# Patient Record
Sex: Male | Born: 1945 | Race: White | Hispanic: No | Marital: Married | Smoking: Never smoker
Health system: Southern US, Community
[De-identification: ages and names within clinical notes are randomized; demographics above are authoritative.]

## PROBLEM LIST (undated history)

## (undated) DIAGNOSIS — IMO0002 Reserved for concepts with insufficient information to code with codable children: Secondary | ICD-10-CM

## (undated) DIAGNOSIS — C439 Malignant melanoma of skin, unspecified: Secondary | ICD-10-CM

## (undated) DIAGNOSIS — Z87442 Personal history of urinary calculi: Secondary | ICD-10-CM

## (undated) DIAGNOSIS — T8859XA Other complications of anesthesia, initial encounter: Secondary | ICD-10-CM

## (undated) DIAGNOSIS — N400 Enlarged prostate without lower urinary tract symptoms: Secondary | ICD-10-CM

## (undated) DIAGNOSIS — N200 Calculus of kidney: Secondary | ICD-10-CM

## (undated) DIAGNOSIS — I2510 Atherosclerotic heart disease of native coronary artery without angina pectoris: Secondary | ICD-10-CM

## (undated) DIAGNOSIS — D126 Benign neoplasm of colon, unspecified: Secondary | ICD-10-CM

## (undated) DIAGNOSIS — C801 Malignant (primary) neoplasm, unspecified: Secondary | ICD-10-CM

## (undated) DIAGNOSIS — M1990 Unspecified osteoarthritis, unspecified site: Secondary | ICD-10-CM

## (undated) DIAGNOSIS — N529 Male erectile dysfunction, unspecified: Secondary | ICD-10-CM

## (undated) DIAGNOSIS — T4145XA Adverse effect of unspecified anesthetic, initial encounter: Secondary | ICD-10-CM

## (undated) DIAGNOSIS — R9430 Abnormal result of cardiovascular function study, unspecified: Secondary | ICD-10-CM

## (undated) DIAGNOSIS — K219 Gastro-esophageal reflux disease without esophagitis: Secondary | ICD-10-CM

## (undated) DIAGNOSIS — R011 Cardiac murmur, unspecified: Secondary | ICD-10-CM

## (undated) DIAGNOSIS — I10 Essential (primary) hypertension: Secondary | ICD-10-CM

## (undated) DIAGNOSIS — E785 Hyperlipidemia, unspecified: Secondary | ICD-10-CM

## (undated) DIAGNOSIS — T884XXA Failed or difficult intubation, initial encounter: Secondary | ICD-10-CM

## (undated) DIAGNOSIS — K649 Unspecified hemorrhoids: Secondary | ICD-10-CM

## (undated) DIAGNOSIS — G4700 Insomnia, unspecified: Secondary | ICD-10-CM

## (undated) DIAGNOSIS — R159 Full incontinence of feces: Secondary | ICD-10-CM

## (undated) HISTORY — DX: Male erectile dysfunction, unspecified: N52.9

## (undated) HISTORY — DX: Hyperlipidemia, unspecified: E78.5

## (undated) HISTORY — DX: Unspecified hemorrhoids: K64.9

## (undated) HISTORY — DX: Full incontinence of feces: R15.9

## (undated) HISTORY — DX: Abnormal result of cardiovascular function study, unspecified: R94.30

## (undated) HISTORY — PX: PROSTATE BIOPSY: SHX241

## (undated) HISTORY — DX: Benign neoplasm of colon, unspecified: D12.6

## (undated) HISTORY — DX: Cardiac murmur, unspecified: R01.1

## (undated) HISTORY — DX: Reserved for concepts with insufficient information to code with codable children: IMO0002

## (undated) HISTORY — DX: Insomnia, unspecified: G47.00

## (undated) HISTORY — PX: TONSILLECTOMY: SUR1361

## (undated) HISTORY — DX: Essential (primary) hypertension: I10

## (undated) HISTORY — DX: Benign prostatic hyperplasia without lower urinary tract symptoms: N40.0

---
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
## 2004-02-09 ENCOUNTER — Ambulatory Visit: Payer: Self-pay | Admitting: Internal Medicine

## 2004-02-21 ENCOUNTER — Ambulatory Visit: Payer: Self-pay | Admitting: Internal Medicine

## 2004-05-31 ENCOUNTER — Ambulatory Visit: Payer: Self-pay | Admitting: Internal Medicine

## 2004-08-31 ENCOUNTER — Ambulatory Visit: Payer: Self-pay | Admitting: Internal Medicine

## 2004-11-30 ENCOUNTER — Ambulatory Visit: Payer: Self-pay | Admitting: Internal Medicine

## 2005-02-09 ENCOUNTER — Ambulatory Visit: Payer: Self-pay | Admitting: Internal Medicine

## 2005-03-29 ENCOUNTER — Ambulatory Visit: Payer: Self-pay | Admitting: Internal Medicine

## 2005-05-10 ENCOUNTER — Ambulatory Visit: Payer: Self-pay | Admitting: Internal Medicine

## 2005-08-08 ENCOUNTER — Ambulatory Visit: Payer: Self-pay | Admitting: Internal Medicine

## 2005-08-10 ENCOUNTER — Ambulatory Visit: Payer: Self-pay | Admitting: Internal Medicine

## 2006-01-15 DIAGNOSIS — R011 Cardiac murmur, unspecified: Secondary | ICD-10-CM

## 2006-01-15 HISTORY — DX: Cardiac murmur, unspecified: R01.1

## 2006-02-05 ENCOUNTER — Ambulatory Visit: Payer: Self-pay | Admitting: Internal Medicine

## 2006-03-01 ENCOUNTER — Ambulatory Visit: Payer: Self-pay | Admitting: Family Medicine

## 2006-07-10 ENCOUNTER — Encounter: Admission: RE | Admit: 2006-07-10 | Discharge: 2006-07-10 | Payer: Self-pay | Admitting: *Deleted

## 2006-07-29 ENCOUNTER — Encounter: Payer: Self-pay | Admitting: Internal Medicine

## 2006-07-29 DIAGNOSIS — Z87442 Personal history of urinary calculi: Secondary | ICD-10-CM | POA: Insufficient documentation

## 2006-07-29 DIAGNOSIS — N400 Enlarged prostate without lower urinary tract symptoms: Secondary | ICD-10-CM | POA: Insufficient documentation

## 2006-07-29 DIAGNOSIS — E785 Hyperlipidemia, unspecified: Secondary | ICD-10-CM | POA: Insufficient documentation

## 2006-07-29 DIAGNOSIS — I10 Essential (primary) hypertension: Secondary | ICD-10-CM | POA: Insufficient documentation

## 2006-10-30 ENCOUNTER — Encounter (INDEPENDENT_AMBULATORY_CARE_PROVIDER_SITE_OTHER): Payer: Self-pay | Admitting: Urology

## 2006-10-30 ENCOUNTER — Ambulatory Visit (HOSPITAL_BASED_OUTPATIENT_CLINIC_OR_DEPARTMENT_OTHER): Admission: RE | Admit: 2006-10-30 | Discharge: 2006-10-30 | Payer: Self-pay | Admitting: Urology

## 2007-03-05 ENCOUNTER — Encounter: Payer: Self-pay | Admitting: Internal Medicine

## 2010-05-30 NOTE — Op Note (Signed)
Jason Blackwell, Jason Blackwell             ACCOUNT NO.:  [id number]   MEDICAL RECORD NO.:  [id number]          PATIENT TYPE:  AMB   LOCATION:  [facility]                         FACILITY:  [facility]   PHYSICIAN:  Valetta Fuller, M.D.  DATE OF BIRTH:  May 12, 1945   DATE OF PROCEDURE:  10/30/2006  DATE OF DISCHARGE:                               OPERATIVE REPORT   PREOPERATIVE DIAGNOSES:  1. Elevated PSA.  2. Microhematuria.   POSTOPERATIVE DIAGNOSES:  1. Elevated PSA.  2. Microhematuria.   OPERATION PERFORMED:  1. Flexible cystoscopy.  2. Transrectal ultrasound of the prostate with ultrasound guided      biopsies.   SURGEON:  Valetta Fuller, M.D.   ANESTHESIA:   INDICATIONS FOR PROCEDURE:  Jason Blackwell is a 65 year old male.  He has a  somewhat complicated urologic history.  The patient originally came to  see me in August of this year.  He had a longstanding history of  elevated PSA, has also had some prior prostatitis and microhematuria as  well as some voiding dysfunction.  The patient tells me that for the  last 15 years, he has had elevated PSA.  He has seen several different  urologists [place].  Over the years he has  had four separate ultrasounds and biopsies including at least one  saturation biopsy in the operating room.  Per his report, he had never  had any documentation of prostate cancer and his original biopsy was  done approximately 10 years ago.  The patient's PSA has fluctuated over  the years.  It has been as high as 13.  Most recent PSA was in the mid-9  range.  The patient has also had some longstanding voiding dysfunction  and outlet obstruction.  He is noted to have a significantly enlarged  prostate on digital rectal exam but no other abnormalities.  He has also  had some microhematuria in the past.  The patient did have urine  cytology sent off which showed no evidence of malignant cells.  As part  of his assessment, however, he also had a  urine PCA3 test which was  positive suggesting a higher likelihood of prostate cancer.  For that  reason, I recommended that we go ahead with repeat ultrasound and biopsy  of his prostate.  It has been over five years since he has had any  biopsies.  The patient preferred to have this under some type of  sedation.  For this reason, we recommended IV sedation.  He has done the  preparatory steps and has given full informed consent.   TECHNIQUE AND FINDINGS:  The patient was brought to the operating room  where he received some IV sedation and monitoring by anesthesia  services.  Flexible cystoscopy was performed.  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  The bladder  otherwise did not show any obvious pathology.   The patient was then placed in lateral decubitus position.  Some  lidocaine jelly was instilled per rectum.  Ultrasound probe was placed.  Representative sagittal and transverse images of the prostate  were  taken.  He clearly had a  significantly enlarged prostate but no obvious hypoechoic areas with a  fairly normal capsular perimeter.  Periprostatic block was performed  with lidocaine.  We then did sextant biopsies with two biopsies in each  of the six locations.  This was well tolerated by the patient.  He was  brought to the recovery room in stable condition.           ______________________________  Valetta Fuller, M.D.  Electronically Signed     DSG/MEDQ  D:  10/30/2006  T:  10/31/2006  Job:  [id number]

## 2010-06-02 NOTE — Assessment & Plan Note (Signed)
[facility] OFFICE NOTE   Jason Blackwell, Jason Blackwell                      MRN:          [id number]  DATE:02/05/2006                            DOB:          1945-07-02    The patient is seen today for followup. He has a history of  hypertension, dyslipidemia, chronically elevated PSA. His fourth  prostate biopsy was done at [facility] a number of years ago. Post  procedure he had diffuse muscle weakness to the point he could hardly  move his limbs. Since that time he has carried a medical alert bracelet  stating that he is a difficult intubation patient and also that he is  allergic to SUCCINYLCHOLINE. Also since that time he has been told that  he is allergic to statins. However, he had taken Zocor for a number of  years prior to this episode. More recently he has been on Zetia. He  states that at least 80, 85% of his time his home blood pressure  readings are less than 130/80.   PHYSICAL EXAMINATION:  VITAL SIGNS:  Today blood pressure was 140 to 144  over 80 to 84 range.  On repeat fundi normal, ENT clear.  NECK:  No adenopathy.  CHEST:  Clear.  CARDIOVASCULAR:  Reveals grade 1-2/6 systolic murmur at the primary  aortic area.  ABDOMEN:  Obese, soft, nontender. No organomegaly.   IMPRESSION:  1. Hypertension.  2. Dyslipidemia.  3. Elevated PSA.  4. Systolic murmur.   DISPOSITION:  Will be scheduled for a physical this summer. His murmur  will be reassessed at that time __________  unchanged.     Gordy Savers, MD  Electronically Signed    PFK/MedQ  DD: 02/05/2006  DT: 02/05/2006  Job #: [id number]

## 2010-06-02 NOTE — Assessment & Plan Note (Signed)
[facility] OFFICE NOTE   Jason Blackwell, Jason Blackwell                      MRN:          [id number]  DATE:08/10/2005                            DOB:          29-Dec-1945    BRIEF HISTORY:  This 65 year old gentleman seen today for a comprehensive  evaluation.  Medical problems include hypertension, dyslipidemia, BPH and a  chronically elevated PSA.  He is doing well today without concerns or  complaints.  Also has a history of kidney stone disease.   REVIEW OF SYSTEMS:  Review of system exam is negative, has had four prostate  biopsies over the years due to chronically elevated PSA.  Colonoscopy was in  2003.   FAMILY HISTORY:  Family history reviewed, basically unchanged.  Father  history of MI and possibly cerebrovascular disease.   EXAMINATION:  GENERAL:  Exam revealed an overweight male.  No acute  distress.  VITALS:  Blood pressure is 126/80.  FUNDI, EAR, NOSE AND THROAT:  Clear.  NECK:  No bruits.  CHEST:  Clear.  CARDIOVASCULAR EXAM:  Normal heart sounds.  No murmurs.  ABDOMEN:  Obese, soft, and nontender, no organomegaly.  GU/RECTAL:  External genitalia reveals some slight right testicular  enlargement but no pain.  Rectal exam prostate +3 and benign.  Stool  Hemoccult negative.  EXTREMITIES:  Intact peripheral pulses.  NEURO:  Negative.   IMPRESSION:  1.  Hypertension.  2.  Dyslipidemia.  3.  Obesity.  4.  Benign prostatic hypertrophy.  5.  Chronically elevated prostate-specific antigen.  6.  History of kidney stones.   DISPOSITION:  Medical regimen unchanged.  We will reassess in 6-12 months.                                   Gordy Savers, MD   PFK/MedQ  DD:  08/10/2005  DT:  08/10/2005  Job #:  [id number]

## 2010-08-07 ENCOUNTER — Other Ambulatory Visit: Payer: Self-pay | Admitting: Gastroenterology

## 2010-10-25 LAB — I-STAT 8, (EC8 V) (CONVERTED LAB)
Acid-Base Excess: 4 — ABNORMAL HIGH
Bicarbonate: 28.9 — ABNORMAL HIGH
Glucose, Bld: 99
TCO2: 30
pCO2, Ven: 43.1 — ABNORMAL LOW
pH, Ven: 7.435 — ABNORMAL HIGH

## 2011-01-16 HISTORY — PX: SKIN LESION EXCISION: SHX2412

## 2012-08-04 ENCOUNTER — Other Ambulatory Visit: Payer: Self-pay | Admitting: Gastroenterology

## 2012-08-08 ENCOUNTER — Encounter (INDEPENDENT_AMBULATORY_CARE_PROVIDER_SITE_OTHER): Payer: Self-pay

## 2012-08-11 ENCOUNTER — Ambulatory Visit (INDEPENDENT_AMBULATORY_CARE_PROVIDER_SITE_OTHER): Payer: Medicare Other | Admitting: General Surgery

## 2012-08-11 ENCOUNTER — Encounter (INDEPENDENT_AMBULATORY_CARE_PROVIDER_SITE_OTHER): Payer: Self-pay | Admitting: General Surgery

## 2012-08-11 VITALS — BP 138/82 | HR 66 | Temp 98.9°F | Resp 12 | Ht 70.5 in | Wt 217.5 lb

## 2012-08-11 DIAGNOSIS — K6289 Other specified diseases of anus and rectum: Secondary | ICD-10-CM

## 2012-08-11 NOTE — Patient Instructions (Signed)
HEMORRHOIDS    Did you know... Hemorrhoids are one of the most common ailments known.  More than half the population will develop hemorrhoids, usually after age 67.  Millions of Americans currently suffer from hemorrhoids.  The average person suffers in silence for a long period before seeking medical care.  Today's treatment methods make some types of hemorrhoid removal much less painful.  What are hemorrhoids? Often described as "varicose veins of the anus and rectum", hemorrhoids are enlarged, bulging blood vessels in and about the anus and lower rectum. There are two types of hemorrhoids: external and internal, which refer to their location.  External (outside) hemorrhoids develop near the anus and are covered by very sensitive skin. These are usually painless. However, if a blood clot (thrombosis) develops in an external hemorrhoid, it becomes a painful, hard lump. The external hemorrhoid may bleed if it ruptures. Internal (inside) hemorrhoids develop within the anus beneath the lining. Painless bleeding and protrusion during bowel movements are the most common symptom. However, an internal hemorrhoid can cause severe pain if it is completely "prolapsed" - protrudes from the anal opening and cannot be pushed back inside.   What causes hemorrhoids? An exact cause is unknown; however, the upright posture of humans alone forces a great deal of pressure on the rectal veins, which sometimes causes them to bulge. Other contributing factors include:  . Aging  . Chronic constipation or diarrhea  . Pregnancy  . Heredity  . Straining during bowel movements  . Faulty bowel function due to overuse of laxatives or enemas . Spending long periods of time (e.g., reading) on the toilet  Whatever the cause, the tissues supporting the vessels stretch. As a result, the vessels dilate; their walls become thin and bleed. If the stretching and pressure continue, the weakened vessels protrude.  What are the  symptoms? If you notice any of the following, you could have hemorrhoids:  . Bleeding during bowel movements  . Protrusion during bowel movements . Itching in the anal area  . Pain  . Sensitive lump(s)  How are hemorrhoids treated? Mild symptoms can be relieved frequently by increasing the amount of fiber (e.g., fruits, vegetables, breads and cereals) and fluids in the diet. Eliminating excessive straining reduces the pressure on hemorrhoids and helps prevent them from protruding. A sitz bath - sitting in plain warm water for about 10 minutes - can also provide some relief . With these measures, the pain and swelling of most symptomatic hemorrhoids will decrease in two to seven days, and the firm lump should recede within four to six weeks. In cases of severe or persistent pain from a thrombosed hemorrhoid, your physician may elect to remove the hemorrhoid containing the clot with a small incision. Performed under local anesthesia as an outpatient, this procedure generally provides relief. Severe hemorrhoids may require special treatment, much of which can be performed on an outpatient basis.  . Ligation - the rubber band treatment - works effectively on internal hemorrhoids that protrude with bowel movements. A small rubber band is placed over the hemorrhoid, cutting off its blood supply. The hemorrhoid and the band fall off in a few days and the wound usually heals in a week or two. This procedure sometimes produces mild discomfort and bleeding and may need to be repeated for a full effect.  There is a more intense version of this procedure that is done in the OR as outpatient surgery called THD.  It involves identifying blood vessels leading to the   hemorrhoids and then tying them off with sutures.  This method is a little more painful than rubber band ligation but less painful than traditional hemorrhoidectomy and usually does not have to be repeated.  It is best for internal hemorrhoids that  bleed.  Rubber Band Ligation of Internal Hemorrhoids:  A.  Bulging, bleeding, internal hemorrhoid B.  Rubber band applied at the base of the hemorrhoid C.  About 7 days later, the banded hemorrhoid has fallen off leaving a small scar (arrow)  . Injection and Coagulation can also be used on bleeding hemorrhoids that do not protrude. Both methods are relatively painless and cause the hemorrhoid to shrivel up. [name] Hemorrhoidectomy - surgery to remove the hemorrhoids - is the most complete method for removal of internal and external hemorrhoids. It is necessary when (1) clots repeatedly form in external hemorrhoids; (2) ligation fails to treat internal hemorrhoids; (3) the protruding hemorrhoid cannot be reduced; or (4) there is persistent bleeding. A hemorrhoidectomy removes excessive tissue that causes the bleeding and protrusion. It is done under anesthesia using sutures, and may, depending upon circumstances, require hospitalization and a period of inactivity. Laser hemorrhoidectomies do not offer any advantage over standard operative techniques. They are also quite expensive, and contrary to popular belief, are no less painful.  Do hemorrhoids lead to cancer? No. There is no relationship between hemorrhoids and cancer. However, the symptoms of hemorrhoids, particularly bleeding, are similar to those of colorectal cancer and other diseases of the digestive system. Therefore, it is important that all symptoms are investigated by a physician specially trained in treating diseases of the colon and rectum and that everyone 50 years or older undergo screening tests for colorectal cancer. Do not rely on over-the-counter medications or other self-treatments. See a colorectal surgeon first so your symptoms can be properly evaluated and effective treatment prescribed.  2012 American Society of Colon & Rectal Surgeons      Fiber Chart  You should 25-30g of fiber per day and drinking 8 glasses of water to  help your bowels move regularly.  In the chart below you can look up how much fiber you are getting in an average day.  If you are not getting enough fiber, you should add a fiber supplement to your diet to gradually get to 25g total.  Examples of this include Metamucil, FiberCon and Citrucel.  These can be purchased at your local grocery store or pharmacy.      [email or web address]   GETTING TO GOOD BOWEL HEALTH. Irregular bowel habits such as constipation can lead to many problems over time.  Having one soft bowel movement a day is the most important way to prevent further problems.  The anorectal canal is designed to handle stretching and feces to safely manage our ability to get rid of solid waste (feces, poop, stool) out of our body.  BUT, hard constipated stools can act like ripping concrete bricks causing inflamed hemorrhoids, anal fissures, abdominal pain and bloating.     The goal: ONE SOFT BOWEL MOVEMENT A DAY!  To have soft, regular bowel movements:    Drink at least 8 tall glasses of water a day.     Take plenty of fiber.  Fiber is the undigested part of plant food that passes into the colon, acting s "natures broom" to encourage bowel motility and movement.  Fiber can absorb and hold large amounts of water. This results in a larger, bulkier stool, which is soft and easier to pass. Work gradually over  several weeks up to 6 servings a day of fiber (25g a day even more if needed) in the form of: o Vegetables -- Root (potatoes, carrots, turnips), leafy green (lettuce, salad greens, celery, spinach), or cooked high residue (cabbage, broccoli, etc) o Fruit -- Fresh (unpeeled skin & pulp), Dried (prunes, apricots, cherries, etc ),  or stewed ( applesauce)  o Whole grain breads, pasta, etc (whole wheat)  o Bran cereals    Bulking Agents -- This type of water-retaining fiber generally is easily obtained each day by one of the following:   o Psyllium bran -- The psyllium plant is remarkable because its ground seeds can retain so much water. This product is available as Metamucil, Konsyl, Effersyllium, Per Diem Fiber, or the less expensive generic preparation in drug and health food stores. Although labeled a laxative, it really is not a laxative.  o Methylcellulose -- This is another fiber derived from wood which also retains water. It is available as Citrucel. o Polyethylene Glycol - and "artificial" fiber commonly called Miralax or Glycolax.  It is helpful for people with gassy or bloated feelings with regular fiber o Flax Seed - a less gassy fiber than psyllium   No reading or other relaxing activity while on the toilet. If bowel movements take longer than 5 minutes, you are too constipated   AVOID CONSTIPATION.  High fiber and water intake usually takes care of this.  Sometimes a laxative is needed to stimulate more frequent bowel movements, but    Laxatives are not a good long-term solution as it can wear the colon out. o Osmotics (Milk of Magnesia, Fleets phosphosoda, Magnesium citrate, MiraLax, GoLytely) are safer than  o Stimulants (Senokot, Castor Oil, Dulcolax, Ex Lax)    o Do not take laxatives for more than 7days in a row.    IF SEVERELY CONSTIPATED, try a Bowel Retraining Program: o Do not use laxatives.  o Eat a diet high in roughage, such as bran cereals and leafy vegetables.  o Drink six (6) ounces of prune or apricot juice each morning.  o Eat two (2) large servings of stewed fruit each day.  o Take one (1) heaping tablespoon of a psyllium-based bulking agent twice a day. Use sugar-free sweetener when possible to avoid excessive calories.  o Eat a normal breakfast.  o Set aside 15 minutes after breakfast to sit on the toilet, but do not strain to have a bowel movement.  o If you do not have a bowel movement by the third day, use an enema and repeat the above steps.

## 2012-08-11 NOTE — Progress Notes (Signed)
Chief Complaint  Patient presents with  . Hemorrhoids    HISTORY: Jason Blackwell is a 67 y.o. male who presents to the office with anal pain, leakage and urgency. The bleeding is mostly on the toilet paper.  Other symptoms include burning and itching.  This had been occurring for about 2 years.  He has tried proctozone in the past with some success.  Frequent wiping makes the symptoms worse.   It is intermittent in nature.  His bowel habits are regular and his bowel movements are soft.  He does have to strain quite often.  His fiber intake is dietary.  His last colonoscopy was last week.  He thinks he may have prolapsing tissue.     Past Medical History  Diagnosis Date  . Hypertension   . Hyperlipidemia   . BPH (benign prostatic hypertrophy)   . Chronic kidney disease     kidney stones  . Hemorrhoids   . Insomnia   . Heart murmur 2008    LVH, diastolic dysfunction, aortic sclerosis  . Erectile dysfunction   . Fracture of right foot 2008  . Fecal incontinence   . Colon adenoma       Past Surgical History  Procedure Laterality Date  . Skin lesion excision Right 2013    precancerous lesion from arm        Current Outpatient Prescriptions  Medication Sig Dispense Refill  . AMLODIPINE BESYLATE PO Take 10 mg by mouth daily.      . hydrocortisone (ANUSOL-HC) 2.5 % rectal cream Place 1 application rectally 2 (two) times daily.      . Lactobacillus-Inulin (CULTURELLE DIGESTIVE HEALTH PO) Take by mouth daily.      [name] losartan (COZAAR) 100 MG tablet Take 100 mg by mouth daily.      . Meth-Hyo-M Bl-Na Phos-Ph Sal (URIBEL) 118 MG CAPS Take 1 capsule by mouth daily.      . simethicone (MYLICON) 80 MG chewable tablet Chew 80 mg by mouth as directed. Take 5 tablets a day.  1 in the morning 2 at lunch and 2 at supper      . tadalafil (CIALIS) 5 MG tablet Take 5 mg by mouth daily as needed for erectile dysfunction.      [name] zolpidem (AMBIEN) 10 MG tablet Take 10 mg by mouth at bedtime as needed  for sleep. Take one half to one tablet as needed       No current facility-administered medications for this visit.      Allergies  Allergen Reactions  . Simvastatin     REACTION: unspecified  . Succinylcholine Chloride     Respiratory problems  . Sulfamethoxazole     REACTION: unspecified      Family History  Problem Relation Age of Onset  . Heart disease Mother   . Heart attack Father   . Diabetes Sister   . Cancer - Colon Maternal Uncle     History   Social History  . Marital Status: Married    Spouse Name: N/A    Number of Children: N/A  . Years of Education: N/A   Social History Main Topics  . Smoking status: Never Smoker   . Smokeless tobacco: None  . Alcohol Use: Yes  . Drug Use: No  . Sexually Active: None   Other Topics Concern  . None   Social History Narrative  . None      REVIEW OF SYSTEMS - PERTINENT POSITIVES ONLY: Review of Systems - General  ROS: negative for - chills, fever or weight loss Hematological and Lymphatic ROS: negative for - bleeding problems, blood clots or bruising Respiratory ROS: no cough, shortness of breath, or wheezing Cardiovascular ROS: no chest pain or dyspnea on exertion Gastrointestinal ROS: no abdominal pain, change in bowel habits, or black or bloody stools Genito-Urinary ROS: no dysuria, trouble voiding, or hematuria  EXAM: Filed Vitals:   08/11/12 1332  BP: 138/82  Pulse: 66  Temp: 98.9 F (37.2 C)  Resp: 12    General appearance: alert and cooperative Resp: clear to auscultation bilaterally Cardio: regular rate and rhythm GI: soft, non-tender; bowel sounds normal; no masses,  no organomegaly   Procedure: Anoscopy Surgeon: Maisie Fus Diagnosis: anal pain, bleeding  Assistant: Christella Scheuermann After the risks and benefits were explained, verbal consent was obtained for above procedure  Anesthesia: none Findings: grade 3 internal hemorrhoids, anal mass palpated in R anterior region, perianal skin irritation, min  squeeze, min push pressure     ASSESSMENT AND PLAN: Jason Blackwell is a 68 y.o. M with anal pain and bleeding.  On exam he has perianal inflammation, minimal external hemorroids and inflamed internal hemorrhoid.  There is also an anal nodule palpated.  On exam, they appear to be prolapsing hemorrhoids, but I could not completely rule out rectal prolapse and he cannot reproduce the prolapse he was feeling before.  Given that his discomfort seems to be associated with this anal nodule, I would like to start with an EUA and resection of this mass (most likely a thrombosed internal hemorrhoid).  In the meantime, I will have him start a fiber supplement.  If he continues to have symptoms in the future, he may be a candidate for [facility], but this also may make his incontinence worse.  I suspect there is a component of pelvic floor dysfunction involved that we may need to work up in the future.       Vanita Panda, MD Colon and Rectal Surgery / General Surgery [facility]      Visit Diagnoses: 1. Anal pain     Primary Care Physician: Lillia Mountain, MD

## 2012-08-15 ENCOUNTER — Encounter (INDEPENDENT_AMBULATORY_CARE_PROVIDER_SITE_OTHER): Payer: Self-pay

## 2012-08-19 ENCOUNTER — Telehealth (INDEPENDENT_AMBULATORY_CARE_PROVIDER_SITE_OTHER): Payer: Self-pay

## 2012-08-19 NOTE — Telephone Encounter (Signed)
The pt called in to let us know he is doing better.  He is thinking about cancelling surgery.  He wants to see what Dr Maisie Fus thinks.  He is better, and is bleeding very little if at all.  He has been taking in the recommended fiber.  Even the nodule is better.  Please advise.

## 2012-08-20 ENCOUNTER — Telehealth (INDEPENDENT_AMBULATORY_CARE_PROVIDER_SITE_OTHER): Payer: Self-pay | Admitting: General Surgery

## 2012-08-20 NOTE — Telephone Encounter (Signed)
That's fine.  I will see him back on 9/17 for follow up

## 2012-08-20 NOTE — Telephone Encounter (Signed)
I called the pt and let him know Dr Maisie Fus said that is fine but she wants to see him back in the office to examine him again.  We will keep his appointment he has in September.

## 2012-08-20 NOTE — Telephone Encounter (Signed)
That's fine with me, but he should be followed closely in the office to make sure his exam findings resolve as well.  i will need to see him back sometime next month.  AT

## 2012-08-20 NOTE — Telephone Encounter (Signed)
Per [place] Dec pt feels better cancel sx

## 2012-09-02 ENCOUNTER — Encounter (INDEPENDENT_AMBULATORY_CARE_PROVIDER_SITE_OTHER): Payer: Self-pay

## 2012-09-04 ENCOUNTER — Encounter (HOSPITAL_BASED_OUTPATIENT_CLINIC_OR_DEPARTMENT_OTHER): Payer: Self-pay

## 2012-09-04 ENCOUNTER — Ambulatory Visit (HOSPITAL_BASED_OUTPATIENT_CLINIC_OR_DEPARTMENT_OTHER): Admit: 2012-09-04 | Payer: Self-pay | Admitting: General Surgery

## 2012-09-04 SURGERY — EXAM UNDER ANESTHESIA, RECTUM
Anesthesia: Monitor Anesthesia Care | Site: Rectum

## 2012-10-01 ENCOUNTER — Encounter (INDEPENDENT_AMBULATORY_CARE_PROVIDER_SITE_OTHER): Payer: Self-pay | Admitting: General Surgery

## 2012-10-01 ENCOUNTER — Ambulatory Visit (INDEPENDENT_AMBULATORY_CARE_PROVIDER_SITE_OTHER): Payer: Medicare Other | Admitting: General Surgery

## 2012-10-01 VITALS — BP 162/94 | HR 68 | Temp 97.4°F | Resp 14 | Ht 70.0 in | Wt 227.4 lb

## 2012-10-01 DIAGNOSIS — K6289 Other specified diseases of anus and rectum: Secondary | ICD-10-CM

## 2012-10-01 NOTE — Patient Instructions (Signed)
Follow up as needed

## 2012-10-01 NOTE — Progress Notes (Signed)
Jason Blackwell is a 67 y.o. male who is here for a follow up visit regarding his anal pain.  This has gotten significantly better on a fiber regimen.  He is not using the anusol cream at this time.  He is using desitin to protect his skin and doing nightly sitz baths.  Objective: Filed Vitals:   10/01/12 1525  BP: 162/94  Pulse: 68  Temp: 97.4 F (36.3 C)  Resp: 14    General appearance: alert and cooperative DRE: no masses palpated, skin healing well  Assessment and Plan: Pain better in high fiber diet.  Mass resolved.  F/U PRN    .Vanita Panda, MD [facility], [place] [phone]

## 2013-10-14 ENCOUNTER — Other Ambulatory Visit: Payer: Self-pay | Admitting: Internal Medicine

## 2013-10-14 DIAGNOSIS — R3129 Other microscopic hematuria: Secondary | ICD-10-CM

## 2013-10-16 ENCOUNTER — Ambulatory Visit
Admission: RE | Admit: 2013-10-16 | Discharge: 2013-10-16 | Disposition: A | Discharge status: Home / Self Care | Payer: Medicare Other | Source: Ambulatory Visit | Attending: Internal Medicine | Admitting: Internal Medicine

## 2013-10-16 DIAGNOSIS — R3129 Other microscopic hematuria: Secondary | ICD-10-CM

## 2013-11-26 ENCOUNTER — Other Ambulatory Visit: Payer: Self-pay | Admitting: Gastroenterology

## 2013-12-30 ENCOUNTER — Ambulatory Visit (HOSPITAL_BASED_OUTPATIENT_CLINIC_OR_DEPARTMENT_OTHER): Payer: Medicare Other | Admitting: Radiology

## 2013-12-30 ENCOUNTER — Ambulatory Visit (HOSPITAL_COMMUNITY): Payer: Medicare Other | Attending: Cardiology | Admitting: Radiology

## 2013-12-30 ENCOUNTER — Encounter: Payer: Self-pay | Admitting: *Deleted

## 2013-12-30 ENCOUNTER — Other Ambulatory Visit (HOSPITAL_COMMUNITY): Payer: Self-pay | Admitting: Internal Medicine

## 2013-12-30 ENCOUNTER — Encounter (HOSPITAL_COMMUNITY): Payer: Self-pay | Admitting: Pharmacy Technician

## 2013-12-30 ENCOUNTER — Ambulatory Visit (INDEPENDENT_AMBULATORY_CARE_PROVIDER_SITE_OTHER): Payer: Medicare Other | Admitting: Cardiology

## 2013-12-30 ENCOUNTER — Encounter: Payer: Self-pay | Admitting: Cardiology

## 2013-12-30 VITALS — BP 146/96 | Ht 70.0 in | Wt 239.0 lb

## 2013-12-30 DIAGNOSIS — R972 Elevated prostate specific antigen [PSA]: Secondary | ICD-10-CM

## 2013-12-30 DIAGNOSIS — R9439 Abnormal result of other cardiovascular function study: Secondary | ICD-10-CM

## 2013-12-30 DIAGNOSIS — R011 Cardiac murmur, unspecified: Secondary | ICD-10-CM | POA: Diagnosis present

## 2013-12-30 DIAGNOSIS — E785 Hyperlipidemia, unspecified: Secondary | ICD-10-CM | POA: Insufficient documentation

## 2013-12-30 DIAGNOSIS — R079 Chest pain, unspecified: Secondary | ICD-10-CM

## 2013-12-30 DIAGNOSIS — R0789 Other chest pain: Secondary | ICD-10-CM

## 2013-12-30 DIAGNOSIS — Z888 Allergy status to other drugs, medicaments and biological substances status: Secondary | ICD-10-CM | POA: Diagnosis not present

## 2013-12-30 DIAGNOSIS — R0989 Other specified symptoms and signs involving the circulatory and respiratory systems: Secondary | ICD-10-CM

## 2013-12-30 DIAGNOSIS — I10 Essential (primary) hypertension: Secondary | ICD-10-CM | POA: Insufficient documentation

## 2013-12-30 DIAGNOSIS — R9430 Abnormal result of cardiovascular function study, unspecified: Secondary | ICD-10-CM | POA: Insufficient documentation

## 2013-12-30 DIAGNOSIS — I350 Nonrheumatic aortic (valve) stenosis: Secondary | ICD-10-CM

## 2013-12-30 MED ORDER — TECHNETIUM TC 99M SESTAMIBI GENERIC - CARDIOLITE
10.0000 | Freq: Once | INTRAVENOUS | Status: AC | PRN
  Administered 2013-12-30: 10 via INTRAVENOUS

## 2013-12-30 MED ORDER — REGADENOSON 0.4 MG/5ML IV SOLN
0.4000 mg | Freq: Once | INTRAVENOUS | Status: AC
  Administered 2013-12-30: 0.4 mg via INTRAVENOUS

## 2013-12-30 MED ORDER — TECHNETIUM TC 99M SESTAMIBI GENERIC - CARDIOLITE
30.0000 | Freq: Once | INTRAVENOUS | Status: AC | PRN
  Administered 2013-12-30: 30 via INTRAVENOUS

## 2013-12-30 NOTE — H&P (Signed)
.       Expand All Collapse All   Patient ID: Jason Blackwell, male DOB: [birth year], 68 y.o. MRN: [id number]    HPI The patient came to our office today to have an echo and a stress test. The echo revealed ejection fraction of 60-65%. It shows aortic stenosis that is felt to be mild to moderate. The patient then had a stress nuclear study. He had significant chest pain at low level of exercise. The study was changed to pharmacologic. The images reveal significant ischemia in the distribution of the LAD. He was then brought to my office to assess the data immediately. I had a careful discussion with the patient. He has ongoing exertional chest discomfort. He also has other risk factors for coronary disease.  Allergies  Allergen Reactions  . Simvastatin     REACTION: unspecified  . Succinylcholine Chloride     Respiratory problems  . Sulfamethoxazole     REACTION: unspecified    Current Outpatient Prescriptions  Medication Sig Dispense Refill  . aspirin EC 81 MG tablet Take 81 mg by mouth daily.    [name] AMLODIPINE BESYLATE PO Take 10 mg by mouth daily.    . hydrocortisone (ANUSOL-HC) 2.5 % rectal cream Place 1 application rectally 2 (two) times daily.    . Lactobacillus-Inulin ([place] PO) Take by mouth daily.    [name] losartan (COZAAR) 100 MG tablet Take 100 mg by mouth daily.    . Meth-Hyo-M Bl-Na Phos-Ph Sal (URIBEL) 118 MG CAPS Take 1 capsule by mouth daily.    . simethicone (MYLICON) 80 MG chewable tablet Chew 80 mg by mouth as directed. Take 5 tablets a day. 1 in the morning 2 at lunch and 2 at supper    . tadalafil (CIALIS) 5 MG tablet Take 5 mg by mouth daily as needed for erectile dysfunction.    [name] zolpidem (AMBIEN) 10 MG tablet Take 10 mg by mouth at bedtime as needed for sleep. Take one half to one tablet as needed     No current facility-administered medications for this visit.      History   Social History  . Marital Status: Married    Spouse Name: N/A    Number of Children: N/A  . Years of Education: N/A   Occupational History  . Not on file.   Social History Main Topics  . Smoking status: Never Smoker   . Smokeless tobacco: Not on file  . Alcohol Use: Yes  . Drug Use: No  . Sexual Activity: Not on file   Other Topics Concern  . Not on file   Social History Narrative    Family History  Problem Relation Age of Onset  . Heart disease Mother   . Heart attack Father   . Diabetes Sister   . Cancer - Colon Maternal Uncle     Past Medical History  Diagnosis Date  . Hypertension   . Hyperlipidemia   . BPH (benign prostatic hypertrophy)   . Chronic kidney disease     kidney stones  . Hemorrhoids   . Insomnia   . Heart murmur 4098    LVH, diastolic dysfunction, aortic sclerosis  . Erectile dysfunction   . Fracture of right foot 2008  . Fecal incontinence   . Colon adenoma   . Ejection fraction     Past Surgical History  Procedure Laterality Date  . Skin lesion excision Right 2013    precancerous lesion from arm  Patient Active Problem List   Diagnosis Date Noted  . Chest pressure 12/30/2013  . Abnormal stress test 12/30/2013  . Allergy to statin medication 12/30/2013  . Elevated PSA 12/30/2013  . Aortic stenosis 12/30/2013  . Ejection fraction   . HYPERLIPIDEMIA 07/29/2006  . HYPERTENSION 07/29/2006  . BENIGN PROSTATIC HYPERTROPHY 07/29/2006  . NEPHROLITHIASIS, HX OF 07/29/2006    ROS  Patient denies fever, chills, headache, sweats, rash, change in vision, change in hearing, cough, nausea or vomiting, urinary symptoms. All other systems are reviewed and are negative.  PHYSICAL EXAM Patient is oriented to person time and place. Affect is normal. He is  here with his wife. Head is atraumatic. Sclera and conjunctiva are normal. There is no jugular venous distention. Lungs are clear. Respiratory effort is not labored. Cardiac exam reveals S1 and S2. There is a systolic murmur. The abdomen is soft and there is no peripheral edema. There are no musculoskeletal deformities. There are no skin rashes.  There were no vitals filed for this visit.  EKGs on the treadmill revealed that he did have ST depression even though he did not reach target heart rate. His resting EKG reveals normal sinus with no diagnostic abnormalities.  ASSESSMENT & PLAN             Aortic stenosis - Carlena Bjornstad, MD at 12/30/2013 7:17 PM     Status: Written Related Problem: Aortic stenosis   Expand All Collapse All   Echo today suggests mild to moderate aortic stenosis. This will be assessed further in the cath lab. However I doubt that this is the basis of his major symptoms.            Essential hypertension - Carlena Bjornstad, MD at 12/30/2013 7:18 PM     Status: Written Related Problem: Essential hypertension   Expand All Collapse All   The patient does have underlying hypertension that will need further therapy.            Chest pressure - Carlena Bjornstad, MD at 12/30/2013 7:19 PM     Status: Written Related Problem: Chest pressure   Expand All Collapse All   The patient's chest pain and abnormal stress tests are concerning. This is a high risk study that suggest ischemia in the distribution of the LAD. I had a long and careful discussion with the patient and his wife. I encouraged them to proceed with cardiac catheterization tomorrow. I explained the risks and benefits. They understand and agree to proceed.            Elevated PSA - Carlena Bjornstad, MD at 12/30/2013 7:19 PM     Status: Written Related Problem: Elevated PSA   Expand All Collapse All   The patient has an unusual finding of a significantly dilated PSA. This has  been fully assessed over time by his primary team.            Allergy to statin medication - Carlena Bjornstad, MD at 12/30/2013 7:20 PM     Status: Written Related Problem: Allergy to statin medication   Expand All Collapse All   The patient and his wife state that he is allergic to statins. He had an unusual reaction during an evaluation at the [facility]. He attributes this to a combination of other medicines plus his statin.

## 2013-12-30 NOTE — Assessment & Plan Note (Signed)
Echo today suggests mild to moderate aortic stenosis. This will be assessed further in the cath lab. However I doubt that this is the basis of his major symptoms.

## 2013-12-30 NOTE — Assessment & Plan Note (Signed)
The patient and his wife state that he is allergic to statins. He had an unusual reaction during an evaluation at the [facility]. He attributes this to a combination of other medicines plus his statin.

## 2013-12-30 NOTE — Progress Notes (Signed)
[facility] [address] [place], [place] [address] [phone]    Cardiology Nuclear Med Study  DAYQUAN BUYS is a 68 y.o. male     MRN : [id number]     DOB: April 20, 1945  Procedure Date: 12/30/2013  Nuclear Med Background Indication for Stress Test:  Evaluation for Ischemia History:  No Cardiac History Cardiac Risk Factors: Hypertension and Lipids  Symptoms:  Chest Pain and DOE   Nuclear Pre-Procedure Caffeine/Decaff Intake:  None> 12 hrs NPO After: 7:00am   Lungs:  clear O2 Sat: 96% on room air. IV 0.9% NS with Angio Cath:  22g  IV Site: R Antecubital x 1, tolerated well IV Started by:  Irven Baltimore, RN  Chest Size (in):  48 Cup Size: n/a  Height: 5\' 10"  (1.778 m)  Weight:  239 lb (108.41 kg)  BMI:  Body mass index is 34.29 kg/(m^2). Tech Comments:  Patient took Amlodipine and Losartan this am. Irven Baltimore, RN.    Nuclear Med Study 1 or 2 day study: 1 day  Stress Test Type:  Treadmill/Lexiscan  Reading MD: N/A  Order Authorizing Provider:  Irven Shelling, MD  Resting Radionuclide: Technetium 46m Sestamibi  Resting Radionuclide Dose: 11.0 mCi   Stress Radionuclide:  Technetium 96m Sestamibi  Stress Radionuclide Dose: 33.0 mCi           Stress Protocol Rest HR: 73 Stress HR: 118  Rest BP: 146/96 Stress BP: 198/106  Exercise Time (min): 6:00 METS: 6.7   Predicted Max HR: 152 bpm % Max HR: 77.63 bpm Rate Pressure Product: [phone]   Dose of Adenosine (mg):  n/a Dose of Lexiscan: 0.4 mg  Dose of Atropine (mg): n/a Dose of Dobutamine: n/a mcg/kg/min (at max HR)  Stress Test Technologist: Crissie Figures, RN  Nuclear Technologist:  Earl Many, CNMT     Rest Procedure:  Myocardial perfusion imaging was performed at rest 45 minutes following the intravenous administration of Technetium 74m Sestamibi. Rest ECG: NSR - Normal EKG  Stress Procedure:  The patient exercised on the treadmill utilizing the Bruce Protocol for 6:00  minutes. He was unable to achieve target heart rate and c/o severe dyspnea and CP 6/10. The patient was changed to a walking Lexiscan. He received IV Lexiscan 0.4 mg over 15-seconds with concurrent low level exercise and then Technetium 19m Sestamibi was injected at 30-seconds while the patient continued walking one more minute.  Quantitative spect images were obtained after a 45-minute delay. Stress ECG: Significant ST abnormalities consistent with ischemia. 1-2 mm inferior and lateral horizontal ST depression, PVCs and ventricular couplets are seen.  QPS Raw Data Images:  Normal; no motion artifact; normal heart/lung ratio. Stress Images:  There is a moderate to severe reduction in tracer uptake in the mid and apical segments of the anterior and anteroseptal wall and the entire apex. There may also be a small and mild basal inferolateral defect. Rest Images:  there is normalization of the anteroseptal and anteroapical abnormality. There is a persistent mild basal inferolateral abnormality Subtraction (SDS):  These findings are consistent with ischemia in the mid LAD artery distribution. There may be a small basal inferolateral infarct. Transient Ischemic Dilatation (Normal <1.22):  1.05 Lung/Heart Ratio (Normal <0.45):  0.35  Quantitative Gated Spect Images QGS EDV:  121 ml QGS ESV:  56 ml  Impression Exercise Capacity:  Fair exercise capacity. BP Response:  Hypertensive blood pressure response. Clinical Symptoms:  No significant symptoms noted. ECG Impression:  Significant ST abnormalities consistent with ischemia. Comparison with Prior Nuclear Study: No images to compare  Overall Impression:  High risk stress nuclear study suggestive of ischemia in the distribution of the mid LAD artery and a possible small infarction in the distribution of a branch of the RCA or left circumflex artery..  LV Ejection Fraction: 54%.  LV Wall Motion:  Mild basal inferolateral hypokinesis. Borderline overall  LV systolic function.   Sanda Klein, MD, [facility] [phone] office [phone] pager

## 2013-12-30 NOTE — Assessment & Plan Note (Signed)
The patient has an unusual finding of a significantly dilated PSA. This has been fully assessed over time by his primary team.

## 2013-12-30 NOTE — Progress Notes (Signed)
Echocardiogram performed.  

## 2013-12-30 NOTE — Assessment & Plan Note (Signed)
The patient does have underlying hypertension that will need further therapy.

## 2013-12-30 NOTE — Assessment & Plan Note (Signed)
The patient's chest pain and abnormal stress tests are concerning. This is a high risk study that suggest ischemia in the distribution of the LAD. I had a long and careful discussion with the patient and his wife. I encouraged them to proceed with cardiac catheterization tomorrow. I explained the risks and benefits. They understand and agree to proceed.

## 2013-12-30 NOTE — Patient Instructions (Addendum)
Your physician has requested that you have a cardiac catheterization. Cardiac catheterization is used to diagnose and/or treat various heart conditions. Doctors may recommend this procedure for a number of different reasons. The most common reason is to evaluate chest pain. Chest pain can be a symptom of coronary artery disease (CAD), and cardiac catheterization can show whether plaque is narrowing or blocking your heart's arteries. This procedure is also used to evaluate the valves, as well as measure the blood flow and oxygen levels in different parts of your heart. For further information please visit [email or web address]. Please follow instruction sheet, as given. Scheduled for December 31, 2013   Coronary Angiogram A coronary angiogram, also called coronary angiography, is an X-ray procedure used to look at the arteries in the heart. In this procedure, a dye (contrast dye) is injected through a long, hollow tube (catheter). The catheter is about the size of a piece of cooked spaghetti and is inserted through your groin, wrist, or arm. The dye is injected into each artery, and X-rays are then taken to show if there is a blockage in the arteries of your heart. LET [facility] CARE PROVIDER KNOW ABOUT:  Any allergies you have, including allergies to shellfish or contrast dye.   All medicines you are taking, including vitamins, herbs, eye drops, creams, and over-the-counter medicines.   Previous problems you or members of your family have had with the use of anesthetics.   Any blood disorders you have.   Previous surgeries you have had.  History of kidney problems or failure.   Other medical conditions you have. RISKS AND COMPLICATIONS  Generally, a coronary angiogram is a safe procedure. However, problems can occur and include:  Allergic reaction to the dye.  Bleeding from the access site or other locations.  Kidney injury, especially in people with impaired kidney  function.  Stroke (rare).  Heart attack (rare). BEFORE THE PROCEDURE   Do not eat or drink anything after midnight the night before the procedure or as directed by your health care provider.   Ask your health care provider about changing or stopping your regular medicines. This is especially important if you are taking diabetes medicines or blood thinners. PROCEDURE  You may be given a medicine to help you relax (sedative) before the procedure. This medicine is given through an intravenous (IV) access tube that is inserted into one of your veins.   The area where the catheter will be inserted will be washed and shaved. This is usually done in the groin but may be done in the fold of your arm (near your elbow) or in the wrist.   A medicine will be given to numb the area where the catheter will be inserted (local anesthetic).   The health care provider will insert the catheter into an artery. The catheter will be guided by using a special type of X-ray (fluoroscopy) of the blood vessel being examined.   A special dye will then be injected into the catheter, and X-rays will be taken. The dye will help to show where any narrowing or blockages are located in the heart arteries.  AFTER THE PROCEDURE   If the procedure is done through the leg, you will be kept in bed lying flat for several hours. You will be instructed to not bend or cross your legs.  The insertion site will be checked frequently.   The pulse in your feet or wrist will be checked frequently.   Additional blood tests, X-rays, and an  electrocardiogram may be done.  Document Released: 07/08/2002 Document Revised: 05/18/2013 Document Reviewed: 05/26/2012 [facility] Patient Information 2015 [place], [place]. This information is not intended to replace advice given to you by your health care provider. Make sure you discuss any questions you have with your health care provider.

## 2013-12-30 NOTE — Progress Notes (Signed)
Patient ID: Jason Blackwell, male   DOB: 04/07/1945, 68 y.o.   MRN: [id number]    HPI The patient came to our office today to have an echo and a stress test. The echo revealed ejection fraction of 60-65%. It shows aortic stenosis that is felt to be mild to moderate. The patient then had a stress nuclear study. He had significant chest pain at low level of exercise. The study was changed to pharmacologic. The images reveal significant ischemia in the distribution of the LAD. He was then brought to my office to assess the data immediately. I had a careful discussion with the patient. He has ongoing exertional chest discomfort. He also has other risk factors for coronary disease.  Allergies  Allergen Reactions  . Simvastatin     REACTION: unspecified  . Succinylcholine Chloride     Respiratory problems  . Sulfamethoxazole     REACTION: unspecified    Current Outpatient Prescriptions  Medication Sig Dispense Refill  . aspirin EC 81 MG tablet Take 81 mg by mouth daily.    [name] AMLODIPINE BESYLATE PO Take 10 mg by mouth daily.    . hydrocortisone (ANUSOL-HC) 2.5 % rectal cream Place 1 application rectally 2 (two) times daily.    . Lactobacillus-Inulin ([place] PO) Take by mouth daily.    [name] losartan (COZAAR) 100 MG tablet Take 100 mg by mouth daily.    . Meth-Hyo-M Bl-Na Phos-Ph Sal (URIBEL) 118 MG CAPS Take 1 capsule by mouth daily.    . simethicone (MYLICON) 80 MG chewable tablet Chew 80 mg by mouth as directed. Take 5 tablets a day.  1 in the morning 2 at lunch and 2 at supper    . tadalafil (CIALIS) 5 MG tablet Take 5 mg by mouth daily as needed for erectile dysfunction.    [name] zolpidem (AMBIEN) 10 MG tablet Take 10 mg by mouth at bedtime as needed for sleep. Take one half to one tablet as needed     No current facility-administered medications for this visit.    History   Social History  . Marital Status: Married    Spouse Name: N/A    Number of Children: N/A  .  Years of Education: N/A   Occupational History  . Not on file.   Social History Main Topics  . Smoking status: Never Smoker   . Smokeless tobacco: Not on file  . Alcohol Use: Yes  . Drug Use: No  . Sexual Activity: Not on file   Other Topics Concern  . Not on file   Social History Narrative    Family History  Problem Relation Age of Onset  . Heart disease Mother   . Heart attack Father   . Diabetes Sister   . Cancer - Colon Maternal Uncle     Past Medical History  Diagnosis Date  . Hypertension   . Hyperlipidemia   . BPH (benign prostatic hypertrophy)   . Chronic kidney disease     kidney stones  . Hemorrhoids   . Insomnia   . Heart murmur 4128    LVH, diastolic dysfunction, aortic sclerosis  . Erectile dysfunction   . Fracture of right foot 2008  . Fecal incontinence   . Colon adenoma   . Ejection fraction     Past Surgical History  Procedure Laterality Date  . Skin lesion excision Right 2013    precancerous lesion from arm    Patient Active Problem List   Diagnosis Date Noted  .  Chest pressure 12/30/2013  . Abnormal stress test 12/30/2013  . Allergy to statin medication 12/30/2013  . Elevated PSA 12/30/2013  . Aortic stenosis 12/30/2013  . Ejection fraction   . HYPERLIPIDEMIA 07/29/2006  . HYPERTENSION 07/29/2006  . BENIGN PROSTATIC HYPERTROPHY 07/29/2006  . NEPHROLITHIASIS, HX OF 07/29/2006    ROS  Patient denies fever, chills, headache, sweats, rash, change in vision, change in hearing, cough, nausea or vomiting, urinary symptoms. All other systems are reviewed and are negative.  PHYSICAL EXAM Patient is oriented to person time and place. Affect is normal. He is here with his wife. Head is atraumatic. Sclera and conjunctiva are normal. There is no jugular venous distention. Lungs are clear. Respiratory effort is not labored. Cardiac exam reveals S1 and S2. There is a systolic murmur. The abdomen is soft and there is no peripheral edema. There  are no musculoskeletal deformities. There are no skin rashes.  There were no vitals filed for this visit.  EKGs on the treadmill revealed that he did have ST depression even though he did not reach target heart rate. His resting EKG reveals normal sinus with no diagnostic abnormalities.  ASSESSMENT & PLAN

## 2013-12-31 ENCOUNTER — Encounter (HOSPITAL_COMMUNITY): Payer: Self-pay | Admitting: General Practice

## 2013-12-31 ENCOUNTER — Ambulatory Visit (HOSPITAL_COMMUNITY)
Admission: RE | Admit: 2013-12-31 | Discharge: 2014-01-01 | Disposition: A | Discharge status: Home / Self Care | Payer: Medicare Other | Source: Ambulatory Visit | Attending: Interventional Cardiology | Admitting: Interventional Cardiology

## 2013-12-31 ENCOUNTER — Encounter (HOSPITAL_COMMUNITY)
Admission: RE | Disposition: A | Discharge status: Home / Self Care | Payer: Self-pay | Source: Ambulatory Visit | Attending: Interventional Cardiology

## 2013-12-31 ENCOUNTER — Other Ambulatory Visit: Payer: Self-pay

## 2013-12-31 DIAGNOSIS — I4519 Other right bundle-branch block: Secondary | ICD-10-CM | POA: Insufficient documentation

## 2013-12-31 DIAGNOSIS — Z87442 Personal history of urinary calculi: Secondary | ICD-10-CM | POA: Insufficient documentation

## 2013-12-31 DIAGNOSIS — Z7982 Long term (current) use of aspirin: Secondary | ICD-10-CM | POA: Diagnosis not present

## 2013-12-31 DIAGNOSIS — I25119 Atherosclerotic heart disease of native coronary artery with unspecified angina pectoris: Secondary | ICD-10-CM | POA: Insufficient documentation

## 2013-12-31 DIAGNOSIS — I129 Hypertensive chronic kidney disease with stage 1 through stage 4 chronic kidney disease, or unspecified chronic kidney disease: Secondary | ICD-10-CM | POA: Diagnosis not present

## 2013-12-31 DIAGNOSIS — R9439 Abnormal result of other cardiovascular function study: Secondary | ICD-10-CM | POA: Diagnosis not present

## 2013-12-31 DIAGNOSIS — I350 Nonrheumatic aortic (valve) stenosis: Secondary | ICD-10-CM | POA: Diagnosis not present

## 2013-12-31 DIAGNOSIS — Z955 Presence of coronary angioplasty implant and graft: Secondary | ICD-10-CM

## 2013-12-31 DIAGNOSIS — N529 Male erectile dysfunction, unspecified: Secondary | ICD-10-CM | POA: Insufficient documentation

## 2013-12-31 DIAGNOSIS — N400 Enlarged prostate without lower urinary tract symptoms: Secondary | ICD-10-CM | POA: Insufficient documentation

## 2013-12-31 DIAGNOSIS — N189 Chronic kidney disease, unspecified: Secondary | ICD-10-CM | POA: Insufficient documentation

## 2013-12-31 DIAGNOSIS — E876 Hypokalemia: Secondary | ICD-10-CM | POA: Insufficient documentation

## 2013-12-31 DIAGNOSIS — R0789 Other chest pain: Secondary | ICD-10-CM

## 2013-12-31 DIAGNOSIS — E785 Hyperlipidemia, unspecified: Secondary | ICD-10-CM | POA: Diagnosis not present

## 2013-12-31 DIAGNOSIS — I2510 Atherosclerotic heart disease of native coronary artery without angina pectoris: Secondary | ICD-10-CM

## 2013-12-31 DIAGNOSIS — R931 Abnormal findings on diagnostic imaging of heart and coronary circulation: Secondary | ICD-10-CM

## 2013-12-31 DIAGNOSIS — R079 Chest pain, unspecified: Secondary | ICD-10-CM | POA: Diagnosis present

## 2013-12-31 DIAGNOSIS — I10 Essential (primary) hypertension: Secondary | ICD-10-CM | POA: Diagnosis present

## 2013-12-31 HISTORY — PX: CORONARY ANGIOPLASTY WITH STENT PLACEMENT: SHX49

## 2013-12-31 HISTORY — PX: LEFT HEART CATHETERIZATION WITH CORONARY ANGIOGRAM: SHX5451

## 2013-12-31 HISTORY — DX: Calculus of kidney: N20.0

## 2013-12-31 HISTORY — DX: Atherosclerotic heart disease of native coronary artery without angina pectoris: I25.10

## 2013-12-31 LAB — BASIC METABOLIC PANEL
BUN: 14 mg/dL (ref 6–23)
CALCIUM: 9.1 mg/dL (ref 8.4–10.5)
CO2: 27 mEq/L (ref 19–32)
Chloride: 103 mEq/L (ref 96–112)
Creatinine, Ser: 0.7 mg/dL (ref 0.4–1.5)
GFR: 118.9 mL/min (ref >60.00)
GLUCOSE: 108 mg/dL — AB (ref 70–99)
Potassium: 3.8 mEq/L (ref 3.5–5.1)
SODIUM: 139 meq/L (ref 135–145)

## 2013-12-31 LAB — CBC
HCT: 44.7 % (ref 39.0–52.0)
Hemoglobin: 14.9 g/dL (ref 13.0–17.0)
MCHC: 33.4 g/dL (ref 30.0–36.0)
MCV: 96.3 fl (ref 78.0–100.0)
PLATELETS: 297 10*3/uL (ref 150.0–400.0)
RBC: 4.64 Mil/uL (ref 4.22–5.81)
RDW: 13.4 % (ref 11.5–15.5)
WBC: 10.9 10*3/uL — AB (ref 4.0–10.5)

## 2013-12-31 LAB — PROTIME-INR
INR: 1.1 ratio — ABNORMAL HIGH (ref 0.8–1.0)
PROTHROMBIN TIME: 11.9 s (ref 9.6–13.1)

## 2013-12-31 LAB — POCT ACTIVATED CLOTTING TIME: Activated Clotting Time: 497 seconds

## 2013-12-31 SURGERY — LEFT HEART CATHETERIZATION WITH CORONARY ANGIOGRAM
Anesthesia: LOCAL

## 2013-12-31 MED ORDER — LOSARTAN POTASSIUM 50 MG PO TABS: 100.0000 mg | ORAL_TABLET | Freq: Every day | ORAL | Status: DC

## 2013-12-31 MED ORDER — TICAGRELOR 90 MG PO TABS
ORAL_TABLET | ORAL | Status: AC
  Filled 2013-12-31: qty 1

## 2013-12-31 MED ORDER — SODIUM CHLORIDE 0.9 % IJ SOLN: 3.0000 mL | Freq: Two times a day (BID) | INTRAMUSCULAR | Status: DC

## 2013-12-31 MED ORDER — SODIUM CHLORIDE 0.9 % IV SOLN
INTRAVENOUS | Status: DC
  Administered 2013-12-31: 16:00:00 via INTRAVENOUS

## 2013-12-31 MED ORDER — LIDOCAINE HCL (PF) 1 % IJ SOLN
INTRAMUSCULAR | Status: AC
  Filled 2013-12-31: qty 30

## 2013-12-31 MED ORDER — SODIUM CHLORIDE 0.9 % IV SOLN: 250.0000 mL | INTRAVENOUS | Status: DC | PRN

## 2013-12-31 MED ORDER — FENTANYL CITRATE 0.05 MG/ML IJ SOLN
INTRAMUSCULAR | Status: AC
  Filled 2013-12-31: qty 2

## 2013-12-31 MED ORDER — ASPIRIN 81 MG PO CHEW
CHEWABLE_TABLET | ORAL | Status: AC
  Administered 2013-12-31: 81 mg via ORAL
  Filled 2013-12-31: qty 1

## 2013-12-31 MED ORDER — SODIUM CHLORIDE 0.9 % IV SOLN
INTRAVENOUS | Status: DC
  Administered 2013-12-31: 13:00:00 via INTRAVENOUS

## 2013-12-31 MED ORDER — VERAPAMIL HCL 2.5 MG/ML IV SOLN
INTRAVENOUS | Status: AC
  Filled 2013-12-31: qty 2

## 2013-12-31 MED ORDER — ACETAMINOPHEN 325 MG PO TABS
650.0000 mg | ORAL_TABLET | ORAL | Status: DC | PRN
  Administered 2013-12-31 – 2014-01-01 (×2): 650 mg via ORAL
  Filled 2013-12-31 (×2): qty 2

## 2013-12-31 MED ORDER — ASPIRIN 81 MG PO CHEW
81.0000 mg | CHEWABLE_TABLET | ORAL | Status: AC
  Administered 2013-12-31: 81 mg via ORAL

## 2013-12-31 MED ORDER — ASPIRIN 81 MG PO CHEW
81.0000 mg | CHEWABLE_TABLET | Freq: Every day | ORAL | Status: DC
  Administered 2014-01-01: 81 mg via ORAL
  Filled 2013-12-31: qty 1

## 2013-12-31 MED ORDER — MIDAZOLAM HCL 2 MG/2ML IJ SOLN
INTRAMUSCULAR | Status: AC
  Filled 2013-12-31: qty 2

## 2013-12-31 MED ORDER — TICAGRELOR 90 MG PO TABS
90.0000 mg | ORAL_TABLET | Freq: Two times a day (BID) | ORAL | Status: DC
  Administered 2014-01-01: 90 mg via ORAL
  Filled 2013-12-31 (×3): qty 1

## 2013-12-31 MED ORDER — OXYCODONE-ACETAMINOPHEN 5-325 MG PO TABS: 1.0000 | ORAL_TABLET | ORAL | Status: DC | PRN

## 2013-12-31 MED ORDER — SODIUM CHLORIDE 0.9 % IJ SOLN: 3.0000 mL | INTRAMUSCULAR | Status: DC | PRN

## 2013-12-31 MED ORDER — HEPARIN SODIUM (PORCINE) 1000 UNIT/ML IJ SOLN
INTRAMUSCULAR | Status: AC
  Filled 2013-12-31: qty 1

## 2013-12-31 MED ORDER — ONDANSETRON HCL 4 MG/2ML IJ SOLN: 4.0000 mg | Freq: Four times a day (QID) | INTRAMUSCULAR | Status: DC | PRN

## 2013-12-31 MED ORDER — SODIUM CHLORIDE 0.9 % IV SOLN
0.2500 mg/kg/h | INTRAVENOUS | Status: DC
  Filled 2013-12-31: qty 250

## 2013-12-31 MED ORDER — BIVALIRUDIN 250 MG IV SOLR
INTRAVENOUS | Status: AC
  Filled 2013-12-31: qty 250

## 2013-12-31 MED ORDER — ATORVASTATIN CALCIUM 80 MG PO TABS
80.0000 mg | ORAL_TABLET | Freq: Every day | ORAL | Status: DC
  Filled 2013-12-31 (×2): qty 1

## 2013-12-31 MED ORDER — HEPARIN (PORCINE) IN NACL 2-0.9 UNIT/ML-% IJ SOLN
INTRAMUSCULAR | Status: AC
  Filled 2013-12-31: qty 1500

## 2013-12-31 MED ORDER — TIROFIBAN HCL IV 12.5 MG/250 ML
INTRAVENOUS | Status: AC
  Filled 2013-12-31: qty 250

## 2013-12-31 MED ORDER — LOSARTAN POTASSIUM 50 MG PO TABS
100.0000 mg | ORAL_TABLET | Freq: Every day | ORAL | Status: DC
  Administered 2014-01-01: 100 mg via ORAL
  Filled 2013-12-31 (×2): qty 2

## 2013-12-31 MED ORDER — ZOLPIDEM TARTRATE 5 MG PO TABS: 5.0000 mg | ORAL_TABLET | Freq: Every evening | ORAL | Status: DC | PRN

## 2013-12-31 MED ORDER — ASPIRIN EC 81 MG PO TBEC
81.0000 mg | DELAYED_RELEASE_TABLET | Freq: Every day | ORAL | Status: DC
  Filled 2013-12-31 (×2): qty 1

## 2013-12-31 NOTE — Interval H&P Note (Signed)
Cath Lab Visit (complete for each Cath Lab visit)  Clinical Evaluation Leading to the Procedure:   ACS: No.  Non-ACS:    Anginal Classification: CCS III  Anti-ischemic medical therapy: Maximal Therapy (2 or more classes of medications)  Non-Invasive Test Results: High-risk stress test findings: cardiac mortality >3%/year  Prior CABG: No previous CABG      History and Physical Interval Note:  12/31/2013 12:06 PM  Jason Blackwell  has presented today for surgery, with the diagnosis of cp, abnormal myoview  The various methods of treatment have been discussed with the patient and family. After consideration of risks, benefits and other options for treatment, the patient has consented to  Procedure(s): LEFT HEART CATHETERIZATION WITH CORONARY ANGIOGRAM (N/A) as a surgical intervention .  The patient's history has been reviewed, patient examined, no change in status, stable for surgery.  I have reviewed the patient's chart and labs.  Questions were answered to the patient's satisfaction.     Sinclair Grooms

## 2013-12-31 NOTE — Care Management Note (Signed)
    Page 1 of 2   01/01/2014     10:10:31 AM CARE MANAGEMENT NOTE 01/01/2014  Patient:  Jason Blackwell, Jason Blackwell   Account Number:  [id number]  Date Initiated:  12/31/2013  Documentation initiated by:  AMERSON,JULIE  Subjective/Objective Assessment:   Pt s/p PCI on 12/17.     Action/Plan:   Pt to dc on Brilinta therapy.  Will check copay coverage. 30 day free trial card given to pt.   Anticipated DC Date:  01/01/2014   Anticipated DC Plan:  [place]  CM consult  Medication Assistance      Choice offered to / List presented to:             Status of service:  Completed, signed off Medicare Important Message given?   (If response is "NO", the following Medicare IM given date fields will be blank) Date Medicare IM given:   Medicare IM given by:   Date Additional Medicare IM given:   Additional Medicare IM given by:    Discharge Disposition:  HOME/SELF CARE  Per UR Regulation:    If discussed at Long Length of Stay Meetings, dates discussed:    Comments:  Cori Henningsen RN, BSN, MSHL, CCM  Nurse - Case Manager,  (Unit [phone]  01/01/2014 Brilinta Benefits Check: Contact:  CRAIG B. @ [employer] # [phone] OPT-4 BRILINTA 90 MG BID  30 DAY SUPPLY COVER- YES CO-PAY- $ 40.00  30 DAY SUPPLY TIER-3 DRUG LEVEL PRIOR APPROVAL-NO PHARMACY : [facility], AND [facility] Med available for order at [facility]acy [facility] CM provided 30 day free card provided to patient. CM provided Brilinta medication assistance application in the event financial barriers present over the next year. Home / Self care.

## 2013-12-31 NOTE — H&P (View-Only) (Signed)
.       Expand All Collapse All   Patient ID: Jason Blackwell, male DOB: May 30, 1945, 68 y.o. MRN: [id number]    HPI The patient came to our office today to have an echo and a stress test. The echo revealed ejection fraction of 60-65%. It shows aortic stenosis that is felt to be mild to moderate. The patient then had a stress nuclear study. He had significant chest pain at low level of exercise. The study was changed to pharmacologic. The images reveal significant ischemia in the distribution of the LAD. He was then brought to my office to assess the data immediately. I had a careful discussion with the patient. He has ongoing exertional chest discomfort. He also has other risk factors for coronary disease.  Allergies  Allergen Reactions  . Simvastatin     REACTION: unspecified  . Succinylcholine Chloride     Respiratory problems  . Sulfamethoxazole     REACTION: unspecified    Current Outpatient Prescriptions  Medication Sig Dispense Refill  . aspirin EC 81 MG tablet Take 81 mg by mouth daily.    [name] AMLODIPINE BESYLATE PO Take 10 mg by mouth daily.    . hydrocortisone (ANUSOL-HC) 2.5 % rectal cream Place 1 application rectally 2 (two) times daily.    . Lactobacillus-Inulin ([place] PO) Take by mouth daily.    [name] losartan (COZAAR) 100 MG tablet Take 100 mg by mouth daily.    . Meth-Hyo-M Bl-Na Phos-Ph Sal (URIBEL) 118 MG CAPS Take 1 capsule by mouth daily.    . simethicone (MYLICON) 80 MG chewable tablet Chew 80 mg by mouth as directed. Take 5 tablets a day. 1 in the morning 2 at lunch and 2 at supper    . tadalafil (CIALIS) 5 MG tablet Take 5 mg by mouth daily as needed for erectile dysfunction.    [name] zolpidem (AMBIEN) 10 MG tablet Take 10 mg by mouth at bedtime as needed for sleep. Take one half to one tablet as needed     No current facility-administered medications for this visit.      History   Social History  . Marital Status: Married    Spouse Name: N/A    Number of Children: N/A  . Years of Education: N/A   Occupational History  . Not on file.   Social History Main Topics  . Smoking status: Never Smoker   . Smokeless tobacco: Not on file  . Alcohol Use: Yes  . Drug Use: No  . Sexual Activity: Not on file   Other Topics Concern  . Not on file   Social History Narrative    Family History  Problem Relation Age of Onset  . Heart disease Mother   . Heart attack Father   . Diabetes Sister   . Cancer - Colon Maternal Uncle     Past Medical History  Diagnosis Date  . Hypertension   . Hyperlipidemia   . BPH (benign prostatic hypertrophy)   . Chronic kidney disease     kidney stones  . Hemorrhoids   . Insomnia   . Heart murmur 9563    LVH, diastolic dysfunction, aortic sclerosis  . Erectile dysfunction   . Fracture of right foot 2008  . Fecal incontinence   . Colon adenoma   . Ejection fraction     Past Surgical History  Procedure Laterality Date  . Skin lesion excision Right 2013    precancerous lesion from arm  Patient Active Problem List   Diagnosis Date Noted  . Chest pressure 12/30/2013  . Abnormal stress test 12/30/2013  . Allergy to statin medication 12/30/2013  . Elevated PSA 12/30/2013  . Aortic stenosis 12/30/2013  . Ejection fraction   . HYPERLIPIDEMIA 07/29/2006  . HYPERTENSION 07/29/2006  . BENIGN PROSTATIC HYPERTROPHY 07/29/2006  . NEPHROLITHIASIS, HX OF 07/29/2006    ROS  Patient denies fever, chills, headache, sweats, rash, change in vision, change in hearing, cough, nausea or vomiting, urinary symptoms. All other systems are reviewed and are negative.  PHYSICAL EXAM Patient is oriented to person time and place. Affect is normal. He is  here with his wife. Head is atraumatic. Sclera and conjunctiva are normal. There is no jugular venous distention. Lungs are clear. Respiratory effort is not labored. Cardiac exam reveals S1 and S2. There is a systolic murmur. The abdomen is soft and there is no peripheral edema. There are no musculoskeletal deformities. There are no skin rashes.  There were no vitals filed for this visit.  EKGs on the treadmill revealed that he did have ST depression even though he did not reach target heart rate. His resting EKG reveals normal sinus with no diagnostic abnormalities.  ASSESSMENT & PLAN             Aortic stenosis - Carlena Bjornstad, MD at 12/30/2013 7:17 PM     Status: Written Related Problem: Aortic stenosis   Expand All Collapse All   Echo today suggests mild to moderate aortic stenosis. This will be assessed further in the cath lab. However I doubt that this is the basis of his major symptoms.            Essential hypertension - Carlena Bjornstad, MD at 12/30/2013 7:18 PM     Status: Written Related Problem: Essential hypertension   Expand All Collapse All   The patient does have underlying hypertension that will need further therapy.            Chest pressure - Carlena Bjornstad, MD at 12/30/2013 7:19 PM     Status: Written Related Problem: Chest pressure   Expand All Collapse All   The patient's chest pain and abnormal stress tests are concerning. This is a high risk study that suggest ischemia in the distribution of the LAD. I had a long and careful discussion with the patient and his wife. I encouraged them to proceed with cardiac catheterization tomorrow. I explained the risks and benefits. They understand and agree to proceed.            Elevated PSA - Carlena Bjornstad, MD at 12/30/2013 7:19 PM     Status: Written Related Problem: Elevated PSA   Expand All Collapse All   The patient has an unusual finding of a significantly dilated PSA. This has  been fully assessed over time by his primary team.            Allergy to statin medication - Carlena Bjornstad, MD at 12/30/2013 7:20 PM     Status: Written Related Problem: Allergy to statin medication   Expand All Collapse All   The patient and his wife state that he is allergic to statins. He had an unusual reaction during an evaluation at the [facility]. He attributes this to a combination of other medicines plus his statin.

## 2013-12-31 NOTE — CV Procedure (Addendum)
Left Heart Catheterization with Coronary Angiography and PCI Report  Jason Blackwell  68 y.o.  male Mar 30, 1945  Procedure Date: 12/31/2013 Referring Physician: Dola Argyle, M.D. Primary Cardiologist:: Dola Argyle, M.D.  INDICATIONS: High risk stress myocardial perfusion study with anterior ischemia  PROCEDURE: 1. Left heart catheterization; 2. Coronary angiography; 3. Left ventricular angiography; 4. DES LAD 3  CONSENT:  The risks, benefits, and details of the procedure were explained in detail to the patient. Risks including death, stroke, heart attack, kidney injury, allergy, limb ischemia, bleeding and radiation injury were discussed.  The patient verbalized understanding and wanted to proceed.  Informed written consent was obtained.  PROCEDURE TECHNIQUE:  After Xylocaine anesthesia a 5 French Slender sheath was placed in the right radial artery with an angiocath and the modified Seldinger technique.  Coronary angiography was done using a 5 F JR4 and JL 3.5 cm diagnostic catheter.  Left ventriculography was done using the JR 4 catheter and hand injection.   After review of the digital images we felt that the ostial LAD stenosis was the culprit for the patient's abnormal nuclear study and the cause of his symptoms. Multiple views were obtained. After some discussion we proceeded with PCI. He was bolused with bivalirudin and loaded with Brilinta. ACT was greater than 300 seconds. 3.0 x 6 [place] XB LAD was used to obtain guiding shots. We initially chose a 3.5 cm catheter but this selected the circumflex. We placed a Pro-water wire into the distal LAD. A BMW wire was placed in the circumflex. Predilatation using a 3.0 x 12 mm Euphora was performed. We then positioned and deployed a 3.5 x 12 mm long Promus Premier drug-eluting stent to 14 atm. It was post dilated with an 8 mm x 3.75 mm [place] Euphora. In taking final images a steep LAO cranial identified an eccentric 80% LAD stenosis  beyond the large first septal perforator. This image was not taken in initial diagnostic angiography. We felt the lesion was severe enough to warrant therapy. We predilated with a 3.0 by 12 mm Euphora to 14 atm. We then placed a 3.0 x 12 mm Promus Premier and deployed at 14 atm 2. After postdilatation we noted a dissection distal to the stent. A second Promus Premier 3.0 x 12 mm DES was deployed at 11 atm. We then postdilated the entire overlap segment with a 3.5 by 12 millimeter  Emerge to 15 atm in an overlapping fashion. Noted was some deformity in the proximal stent margin in RAO views. The stent appeared to be widely patent in LAO views and cranial views. Additional ballooning was not performed, fearing a proximal edge dissection. After dilating the mid stent segment to 3.5 mm diameter, I decided to  post-dilate the ostial stent with a 4.0 by 8 mm Emerge to 14 atm. Final angiographic images demonstrated no evidence of edge dissection. There is 20-30% narrowing in the proximal third of the mid stent at the margin of the first septal perforator TIMI flow was grade 3. The LAD wraps around the left ventricular apex. Further distal is a region of 50% narrowing.  The sheath was removed and hemostasis achieved with a wrist band at 14 cc.   CONTRAST:  Total of 380 cc.  COMPLICATIONS:  None   HEMODYNAMICS:  Aortic pressure 168/89 mmHg; LV pressure 169/12 mmHg; LVEDP 24 mmHg   ANGIOGRAPHIC DATA:   The left main coronary artery is widely patent.  The left anterior descending artery is severely diseased.  There is segmental ostial 95% stenosis in the mid vessel initially was felt to contain a segmental region of 50% narrowing. Imaging after stenting the ostial LAD demonstrated a 75-80% stenosis noted only in the AP cranial steep views arising after the first of perforator in the LAD.[name]  The left circumflex artery is widely patent with only luminal irregularities. A dominant obtuse marginal branch arises from  the vessel..  The right coronary artery is dominant with 2 large left ventricular branches. No significant obstruction is noted.[name]  PCI RESULTS: Ostial to proximal 95% stenosis is reduced to 0% with TIMI grade 3 flow. Post dilatation to 4.0 mm.  Mid 80% LAD stenosis reduced to less than 30% after angioplasty and stenting. Final balloon diameter 3.5 mm at 15 atm. The deformity of the stent causing the 30% narrowing could not be relieved. TIMI grade 3 flow was noted.  LEFT VENTRICULOGRAM:  Left ventricular angiogram was done in the 30 RAO projection and revealed normal contractility with EF of 70%.   IMPRESSIONS:  1. High-risk myocardial perfusion study with anterior ischemia due to ostial LAD of 95% 2. Successful stenting of the ostial LAD from 95% to 0% (4.0 mm post dilatation diameter). 3. 80% mid LAD stenosis reduced to 30% with TIMI grade 3 flow after overlapping stenting and high pressure balloon inflations. Indentation on the proximal margin of the mis LAD stent could not be relieved. 4. Widely patent circumflex and RCA 5. Normal left ventricular systolic function   RECOMMENDATION:  Aspirin and Brilinta for at least one year and I would consider doing antiplatelet therapy for a year or 2 beyond that given the indentation in the mid LAD stent.

## 2014-01-01 ENCOUNTER — Encounter (HOSPITAL_COMMUNITY): Payer: Self-pay | Admitting: Physician Assistant

## 2014-01-01 ENCOUNTER — Telehealth: Payer: Self-pay | Admitting: Cardiology

## 2014-01-01 DIAGNOSIS — I350 Nonrheumatic aortic (valve) stenosis: Secondary | ICD-10-CM | POA: Diagnosis not present

## 2014-01-01 DIAGNOSIS — I25110 Atherosclerotic heart disease of native coronary artery with unstable angina pectoris: Secondary | ICD-10-CM | POA: Diagnosis not present

## 2014-01-01 DIAGNOSIS — E876 Hypokalemia: Secondary | ICD-10-CM

## 2014-01-01 DIAGNOSIS — I200 Unstable angina: Secondary | ICD-10-CM

## 2014-01-01 DIAGNOSIS — I4519 Other right bundle-branch block: Secondary | ICD-10-CM | POA: Diagnosis not present

## 2014-01-01 DIAGNOSIS — R9439 Abnormal result of other cardiovascular function study: Secondary | ICD-10-CM | POA: Diagnosis not present

## 2014-01-01 DIAGNOSIS — I25119 Atherosclerotic heart disease of native coronary artery with unspecified angina pectoris: Secondary | ICD-10-CM | POA: Diagnosis not present

## 2014-01-01 LAB — CBC
HEMATOCRIT: 38 % — AB (ref 39.0–52.0)
Hemoglobin: 12.6 g/dL — ABNORMAL LOW (ref 13.0–17.0)
MCH: 31.2 pg (ref 26.0–34.0)
MCHC: 33.2 g/dL (ref 30.0–36.0)
MCV: 94.1 fL (ref 78.0–100.0)
PLATELETS: 249 10*3/uL (ref 150–400)
RBC: 4.04 MIL/uL — AB (ref 4.22–5.81)
RDW: 13.2 % (ref 11.5–15.5)
WBC: 8.3 10*3/uL (ref 4.0–10.5)

## 2014-01-01 LAB — BASIC METABOLIC PANEL
Anion gap: 12 (ref 5–15)
BUN: 14 mg/dL (ref 6–23)
CALCIUM: 8.7 mg/dL (ref 8.4–10.5)
CO2: 25 mEq/L (ref 19–32)
Chloride: 105 mEq/L (ref 96–112)
Creatinine, Ser: 0.71 mg/dL (ref 0.50–1.35)
GFR calc Af Amer: >90 mL/min (ref >90)
Glucose, Bld: 115 mg/dL — ABNORMAL HIGH (ref 70–99)
Potassium: 3.2 mEq/L — ABNORMAL LOW (ref 3.7–5.3)
Sodium: 142 mEq/L (ref 137–147)

## 2014-01-01 MED ORDER — POTASSIUM CHLORIDE CRYS ER 20 MEQ PO TBCR
60.0000 meq | EXTENDED_RELEASE_TABLET | Freq: Once | ORAL | Status: AC
  Administered 2014-01-01: 09:00:00 60 meq via ORAL
  Filled 2014-01-01: qty 3

## 2014-01-01 MED ORDER — METOPROLOL TARTRATE 25 MG PO TABS: 25.0000 mg | ORAL_TABLET | Freq: Two times a day (BID) | ORAL | Status: DC

## 2014-01-01 MED ORDER — TICAGRELOR 90 MG PO TABS: 90.0000 mg | ORAL_TABLET | Freq: Two times a day (BID) | ORAL | Status: DC

## 2014-01-01 MED ORDER — METOPROLOL TARTRATE 25 MG PO TABS
25.0000 mg | ORAL_TABLET | Freq: Two times a day (BID) | ORAL | Status: DC
  Administered 2014-01-01: 25 mg via ORAL
  Filled 2014-01-01: qty 1

## 2014-01-01 NOTE — Progress Notes (Signed)
     SUBJECTIVE: NO chest pain or SOB.   BP 142/78 mmHg  Pulse 71  Temp(Src) 98.3 F (36.8 C) (Oral)  Resp 18  Ht 5' 10.5" (1.791 m)  Wt 241 lb 6.5 oz (109.5 kg)  BMI 34.14 kg/m2  SpO2 98%  Intake/Output Summary (Last 24 hours) at 01/01/14 9735 Last data filed at 01/01/14 0600  Gross per 24 hour  Intake 1207.5 ml  Output   1375 ml  Net -167.5 ml    PHYSICAL EXAM General: Well developed, well nourished, in no acute distress. Alert and oriented x 3.  Psych:  Good affect, responds appropriately Neck: No JVD. No masses noted.  Lungs: Clear bilaterally with no wheezes or rhonci noted.  Heart: RRR with no murmurs noted. Abdomen: Bowel sounds are present. Soft, non-tender.  Extremities: No lower extremity edema.   LABS: Basic Metabolic Panel:  Recent Labs  12/30/13 1627 01/01/14 0401  NA 139 142  K 3.8 3.2*  CL 103 105  CO2 27 25  GLUCOSE 108* 115*  BUN 14 14  CREATININE 0.7 0.71  CALCIUM 9.1 8.7   CBC:  Recent Labs  12/30/13 1627 01/01/14 0401  WBC 10.9* 8.3  HGB 14.9 12.6*  HCT 44.7 38.0*  MCV 96.3 94.1  PLT 297.0 249   Current Meds: . aspirin  81 mg Oral Daily  . aspirin EC  81 mg Oral Daily  . atorvastatin  80 mg Oral q1800  . losartan  100 mg Oral Daily  . ticagrelor  90 mg Oral BID   ASSESSMENT AND PLAN:  1. CAD/Unstable angina: Pt with high risk stress test. Cardiac cath 12/31/13 per Dr. Tamala Julian with severe LAD disease. Now s/p 3 DES LAD. Doing well this am. He is on ASA, Brilinta. Will start Lopressor 25 mg po BID. Will stop statin as he has not tolerated in past.  2. Hypokalemia: Replace K this am.    Discharge home today. Follow up with Dr. Ron Parker or office APP in 2 weeks.   MCALHANY,CHRISTOPHER  12/18/20157:07 AM

## 2014-01-01 NOTE — Telephone Encounter (Signed)
New problem   Pt would call back about his medications please.

## 2014-01-01 NOTE — Telephone Encounter (Signed)
Patient wants to know if he can take his Cialis again. Will route to Dr. Ron Parker

## 2014-01-01 NOTE — Telephone Encounter (Signed)
Left message to call back  

## 2014-01-01 NOTE — Telephone Encounter (Signed)
He had a coronary intervention yesterday. We do not yet know if he will need any nitroglycerin going forward. It would be most prudent to not use his Cialis at this time. I will rediscuss with him in the office on January 4. It can probably be resumed after that time.

## 2014-01-01 NOTE — Progress Notes (Signed)
CARDIAC REHAB PHASE I   PRE:  Rate/Rhythm: 71 SR  BP:  Supine: 163/75  Sitting:   Standing:    SaO2:   MODE:  Ambulation: 500 ft   POST:  Rate/Rhythm: 88 SR  BP:  Supine:   Sitting: 179/85  Standing:    SaO2:  0800-0850 Pt walked 500 ft with steady gait. No CP. Tolerated well. Education completed with pt and wife who voiced understanding. Gave heart healthy diet. Discussed importance of brilinta with stent. They have brilinta booklet. Wife stated that the pharmacy that they use does not have brilinta. Will have RN get case manager to see. Discussed CRP 2 and the pt would like referral to [facility].   Graylon Good, RN BSN  01/01/2014 8:47 AM

## 2014-01-01 NOTE — Discharge Summary (Signed)
Physician Discharge Summary     Cardiologist:  Ron Parker  Patient ID: Jason Blackwell MRN: [id number] DOB/AGE: [birth year] 68 y.o.  Admit date: 12/31/2013 Discharge date: 01/01/2014  Admission Diagnoses:  Chest pressure  Discharge Diagnoses:  Active Problems:   Hyperlipidemia   Essential hypertension   Chest pressure   Abnormal nuclear stress test   Aortic stenosis, mild to moderate   Hypokalemia  Discharged Condition: stable  Hospital Course:  The patient came to our office today to have an echo and a stress test. The echo revealed ejection fraction of 60-65%. It shows aortic stenosis that is felt to be mild to moderate. The patient then had a stress nuclear study. He had significant chest pain at low level of exercise. The study was changed to pharmacologic. The images reveal significant ischemia in the distribution of the LAD. He was then brought to my office to assess the data immediately. I had a careful discussion with the patient. He has ongoing exertional chest discomfort. He also has other risk factors for coronary disease.  The patient was scheduled for and underwent a LHC which revealed an ostial LAD lesion of 95%.  Successful stenting of the ostial LAD from 95% to 0% (4.0 mm post dilatation diameter).  80% mid LAD stenosis reduced to 30% with TIMI grade 3 flow after overlapping stenting and high pressure balloon inflations. Indentation on the proximal margin of the LAD stent cannot be relieved.  Widely patent circumflex and RCA.  Normal left ventricular systolic function.  He was started on ASA, Brilinta, lopressor.  We will continue amlodipine as his BP is elevated.  Statin was stopped due to intolerance in the past.  Potassium was replaced.  The patient was seen by Dr. Angelena Form who felt he was stable for DC home.    Consults: None  Significant Diagnostic Studies:   Left Heart Catheterization with Coronary Angiography and PCI Report  Jason Blackwell  68 y.o.   male April 03, 1945  Procedure Date: 12/31/2013 Referring Physician: Dola Blackwell, M.D. Primary Cardiologist:: Jason Blackwell, M.D.  INDICATIONS: High risk stress myocardial perfusion study with anterior ischemia  PROCEDURE: 1. Left heart catheterization; 2. Coronary angiography; 3. Left ventricular angiography; 4. DES LAD 3  CONSENT:  The risks, benefits, and details of the procedure were explained in detail to the patient. Risks including death, stroke, heart attack, kidney injury, allergy, limb ischemia, bleeding and radiation injury were discussed. The patient verbalized understanding and wanted to proceed. Informed written consent was obtained.  PROCEDURE TECHNIQUE: After Xylocaine anesthesia a 5 French Slender sheath was placed in the right radial artery with an angiocath and the modified Seldinger technique. Coronary angiography was done using a 5 F JR4 and JL 3.5 cm diagnostic catheter. Left ventriculography was done using the JR 4 catheter and hand injection.   After review of the digital images we felt that the ostial LAD stenosis was the culprit for the patient's abnormal nuclear study and the cause of his symptoms. Multiple views were obtained. After some discussion we proceeded with PCI. He was bolused with bivalirudin and loaded with Brilinta. ACT was greater than 300 seconds. 3.0 x 6 [place] XB LAD was used to obtain guiding shots. We initially chose a 3.5 cm catheter but this selective the circumflex. We placed a Pro-water wire into the distal LAD. A BMW wire was placed in the circumflex. Predilatation using a 3.0 x 12 mm Euphora was performed. We then positioned and deployed a 3.5 x 12 mm long Promus  Premier drug-eluting stent to 14 atm. It was post dilated with an 8 mm x 3.75 mm [place] Euphora. In taking final images a steep LAO cranial identified an eccentric 80% LAD stenosis beyond the large first septal perforator. This image was not taken in initial diagnostic angiography. We felt  the lesion was severe enough to ward therapy. We predilated with a 3.0 by 12 mm Euphora 214 atm. We then placed a 3.0 x 12 mm Promus Premier and deployed at 14 atm 2. After postdilatation we noted an-dissection distal to the stent. A second Promus Premier 3.0 x 12 mm DES was deployed at 11 atm. We then postdilated the entire overlap segment with a 3.5 by 12 millimeter [place] Emerge to 15 atm in an overlapping fashion. Noted was some deformity in the very inlet to the initial stent and RAO views. The stent appeared to be widely patent in LAO views and cranial views. We finally postdilated the ostial stent with a 40 by 12 mm in see to 14 atm. Final angiographic images demonstrated no evidence of edge dissection. There is 20-30% narrowing in the mid stents at the margin of the first elbow perforator TIMI flow was grade 3. The LAD wraps around the left ventricular apex. Further distal is a region of 50% narrowing.  The sheath was removed and hemostasis achieved with a wrist band at 14 cc.  CONTRAST: Total of 380 cc.  COMPLICATIONS: None   HEMODYNAMICS: Aortic pressure 168/89 mmHg; LV pressure 169/12 mmHg; LVEDP 24 mmHg   ANGIOGRAPHIC DATA: The left main coronary artery is widely patent.  The left anterior descending artery is severely diseased. There is segmental ostial 95% stenosis in the mid vessel initially was felt to contain a segmental region of 50% narrowing. Imaging after stenting the ostial LAD demonstrated a 75-80% stenosis noted only in the AP cranial steep views arising after the first of perforator in the LAD.Jason Blackwell  The left circumflex artery is widely patent with only luminal irregularities. A dominant obtuse marginal branch arises from the vessel..  The right coronary artery is dominant with 2 large left ventricular branches. No significant obstruction is noted.Jason Blackwell  PCI RESULTS: Ostial to proximal 95% stenosis is reduced to 0% with TIMI grade 3 flow. Post dilatation to 4.0 mm.  Mid 80%  LAD stenosis reduced to less than 30% after angioplasty and stenting. Final balloon diameter 3.5 mm at 15 atm. The deformity of the stent causing the 30% narrowing could not be relieved. TIMI grade 3 flow was noted.  LEFT VENTRICULOGRAM: Left ventricular angiogram was done in the 30 RAO projection and revealed normal contractility with EF of 70%.   IMPRESSIONS: 1. High-risk myocardial perfusion study with anterior ischemia due to ostial LAD of 95% 2. Successful stenting of the ostial LAD from 95% to 0% (4.0 mm post dilatation diameter). 3. 80% mid LAD stenosis reduced to 30% with TIMI grade 3 flow after overlapping stenting and high pressure balloon inflations. Indentation on the proximal margin of the LAD stent cannot be relieved. 4. Widely patent circumflex and RCA 5. Normal left ventricular systolic function   RECOMMENDATION: Aspirin and Brilinta for at least one year and I would consider doing antiplatelet therapy for a year or 2 beyond that given the indentation in the mid LAD stent.  Treatments: See above  Discharge Exam: Blood pressure 181/70, pulse 74, temperature 98.3 F (36.8 C), temperature source Oral, resp. rate 18, height 5' 10.5" (1.791 m), weight 241 lb 6.5 oz (109.5 kg), SpO2  98 %.   Disposition: Final discharge disposition not confirmed      Discharge Instructions    Amb Referral to Cardiac Rehabilitation    Complete by:  As directed   Referring to [facility] phase 2     Diet - low sodium heart healthy    Complete by:  As directed      Discharge instructions    Complete by:  As directed   No lifting with right arm for three days.     Increase activity slowly    Complete by:  As directed             Medication List    TAKE these medications        AMLODIPINE BESYLATE PO  Take 10 mg by mouth daily.     aspirin EC 81 MG tablet  Take 81 mg by mouth daily.     [place] PO  Take by mouth daily.     hydrocortisone 2.5 % rectal  cream  Commonly known as:  ANUSOL-HC  Place 1 application rectally 2 (two) times daily.     losartan 100 MG tablet  Commonly known as:  COZAAR  Take 100 mg by mouth daily.     metoprolol tartrate 25 MG tablet  Commonly known as:  LOPRESSOR  Take 1 tablet (25 mg total) by mouth 2 (two) times daily.     simethicone 80 MG chewable tablet  Commonly known as:  MYLICON  Chew 80 mg by mouth as directed. Take 5 tablets a day.  1 in the morning 2 at lunch and 2 at supper     ticagrelor 90 MG Tabs tablet  Commonly known as:  BRILINTA  Take 1 tablet (90 mg total) by mouth 2 (two) times daily.     URIBEL 118 MG Caps  Take 1 capsule by mouth daily.     zolpidem 10 MG tablet  Commonly known as:  AMBIEN  Take 10 mg by mouth at bedtime as needed for sleep. Take one half to one tablet as needed       Follow-up Information    Follow up with Jason Argyle, MD On 01/18/2014.   Specialty:  Cardiology   Why:  3:00 PM   Contact information:   [address] [place] [address] [phone]      Greater than 30 minutes was spent completing the patient's discharge.   SignedTarri Fuller, [place] 01/01/2014, 9:04 AM

## 2014-01-04 ENCOUNTER — Telehealth: Payer: Self-pay | Admitting: Cardiology

## 2014-01-04 NOTE — Telephone Encounter (Signed)
**Note De-Identified Kourtni Stineman Obfuscation** The pt is advised not to take Cialis until advised to by Dr Ron Parker. He verbalized understanding and is aware that he has a f/u scheduled with Dr Ron Parker on 01/18/14.

## 2014-01-04 NOTE — Telephone Encounter (Signed)
New Msg    Pt returning call, may be reached at [phone]

## 2014-01-04 NOTE — Telephone Encounter (Signed)
LMTCB

## 2014-01-04 NOTE — Telephone Encounter (Signed)
**Note De-Identified Amamda Curbow Obfuscation** The pt is advised and he verbalized understanding.  The pt asked that I add to this note that he takes Cialis for BPH.

## 2014-01-17 ENCOUNTER — Encounter: Payer: Self-pay | Admitting: Cardiology

## 2014-01-17 DIAGNOSIS — I2510 Atherosclerotic heart disease of native coronary artery without angina pectoris: Secondary | ICD-10-CM | POA: Insufficient documentation

## 2014-01-17 DIAGNOSIS — N529 Male erectile dysfunction, unspecified: Secondary | ICD-10-CM | POA: Insufficient documentation

## 2014-01-18 ENCOUNTER — Ambulatory Visit (INDEPENDENT_AMBULATORY_CARE_PROVIDER_SITE_OTHER): Payer: Medicare Other | Admitting: Cardiology

## 2014-01-18 ENCOUNTER — Encounter: Payer: Self-pay | Admitting: Cardiology

## 2014-01-18 VITALS — BP 158/84 | HR 69 | Ht 70.0 in | Wt 240.0 lb

## 2014-01-18 DIAGNOSIS — I350 Nonrheumatic aortic (valve) stenosis: Secondary | ICD-10-CM

## 2014-01-18 DIAGNOSIS — I10 Essential (primary) hypertension: Secondary | ICD-10-CM

## 2014-01-18 DIAGNOSIS — E785 Hyperlipidemia, unspecified: Secondary | ICD-10-CM

## 2014-01-18 DIAGNOSIS — I25811 Atherosclerosis of native coronary artery of transplanted heart without angina pectoris: Secondary | ICD-10-CM

## 2014-01-18 DIAGNOSIS — Z888 Allergy status to other drugs, medicaments and biological substances status: Secondary | ICD-10-CM

## 2014-01-18 MED ORDER — CHLORTHALIDONE 25 MG PO TABS: 25.0000 mg | ORAL_TABLET | Freq: Every day | ORAL | Status: DC

## 2014-01-18 NOTE — Assessment & Plan Note (Signed)
The patient has very significant coronary disease affecting the LAD. There has been successful stenting of the proximal LAD at the ostium. The mid LAD lesion has been treated with multiple stents with residual 30% disease. He will be kept on dual antiplatelet therapy for an extended period of time. He is stable. He can return to activities.  I had a very long discussion with the patient and his wife about multiple aspects of his care in his medications. I spent greater than 25 minutes with his total care. More than half of this time was spent with direct contact discussing his care.

## 2014-01-18 NOTE — Assessment & Plan Note (Signed)
The blood pressure has been elevated. I added 25 mg of chlorthalidone to his regimen.

## 2014-01-18 NOTE — Assessment & Plan Note (Signed)
The patient has a true allergy to statin medications documented at the [facility]. I talked to him today about the probability that we would push for the use of a PCSK9. We will discuss this further when I see him back. I have received a copy of recent labs through his primary care team. His LDL was 152.

## 2014-01-18 NOTE — Assessment & Plan Note (Signed)
He has mild aortic stenosis by echo. His gradient was very low in the cath lab.

## 2014-01-18 NOTE — Assessment & Plan Note (Signed)
The patient has significant BPH. He is being treated with Cialis for this and erectile dysfunction. We have had him off this medication during this period of treatment of his coronary disease. I am going to allow him to resume the medication. He is aware that he cannot use nitroglycerin.

## 2014-01-18 NOTE — Patient Instructions (Addendum)
**Note De-Identified Dyasia Firestine Obfuscation** Your physician has recommended you make the following change in your medication: start taking Chlorthalidone 25 mg daily   Your physician recommends that you schedule a follow-up appointment in: 4 weeks

## 2014-01-18 NOTE — Progress Notes (Signed)
Patient ID: Jason Blackwell, male   DOB: [birth year], 69 y.o.   MRN: [id number]    HPI The patient is seen for follow-up of coronary disease and for follow-up post hospitalization. I met the patient on December 30, 2013. He had had an echo in the office showing an ejection fraction of 60-65% with mild aortic stenosis. He also had a nuclear stress test. This had been ordered by his primary physician because of exertional chest discomfort. He had chest pain during the stress test. The study was abnormal suggesting LAD ischemia. He was stable. I had a long discussion with the patient and his wife and plans were made for cardiac catheterization the next day.  The catheterization was done and I have carefully reviewed the data. The patient had significant disease. He received a drug-eluting stent to an ostial LAD lesion of 95%. This was reduced to 0%. There was an 80% mid LAD stenosis reduced to 30% with TIMI grade 3 flow after overlapping stents and high pressure inflations. There was an indentation in the proximal margin of the LAD stent that could not be relieved. Recommendation was for dual antiplatelet therapy for a year or 2 because of the indentation in the mid LAD stent.  The patient returns today and is feeling well. His exertional chest discomfort is definitely gone. He is very appreciative of the care that he has received from everyone.  It is of note that the patient cannot take statin. He had some type of reaction while at the [facility] in the past and was told that he could not take statins.  Allergies  Allergen Reactions  . Simvastatin     REACTION: unspecified  . Succinylcholine Chloride     Respiratory problems  . Sulfamethoxazole     REACTION: unspecified    Current Outpatient Prescriptions  Medication Sig Dispense Refill  . AMLODIPINE BESYLATE PO Take 10 mg by mouth daily.    [name] aspirin EC 81 MG tablet Take 81 mg by mouth daily.    [name] CIALIS 5 MG tablet Take 5 mg by mouth  daily as needed.   10  . hydrocortisone (ANUSOL-HC) 2.5 % rectal cream Place 1 application rectally 2 (two) times daily.    . Lactobacillus-Inulin ([place] PO) Take by mouth daily.    [name] losartan (COZAAR) 100 MG tablet Take 100 mg by mouth daily.    . Meth-Hyo-M Bl-Na Phos-Ph Sal (URIBEL) 118 MG CAPS Take 1 capsule by mouth daily.    . metoprolol tartrate (LOPRESSOR) 25 MG tablet Take 1 tablet (25 mg total) by mouth 2 (two) times daily. 60 tablet 5  . simethicone (MYLICON) 80 MG chewable tablet Chew 80 mg by mouth as directed. Take 5 tablets a day.  1 in the morning 2 at lunch and 2 at supper    . ticagrelor (BRILINTA) 90 MG TABS tablet Take 1 tablet (90 mg total) by mouth 2 (two) times daily. 60 tablet 10  . zolpidem (AMBIEN) 10 MG tablet Take 10 mg by mouth at bedtime as needed for sleep. Take one half to one tablet as needed    . chlorthalidone (HYGROTON) 25 MG tablet Take 1 tablet (25 mg total) by mouth daily. 30 tablet 3   No current facility-administered medications for this visit.    History   Social History  . Marital Status: Married    Spouse Name: N/A    Number of Children: N/A  . Years of Education: N/A   Occupational  History  . Not on file.   Social History Main Topics  . Smoking status: Never Smoker   . Smokeless tobacco: Never Used  . Alcohol Use: 16.8 oz/week    28 Glasses of wine per week  . Drug Use: No  . Sexual Activity: Yes   Other Topics Concern  . Not on file   Social History Narrative    Family History  Problem Relation Age of Onset  . Heart disease Mother   . Heart attack Father   . Diabetes Sister   . Cancer - Colon Maternal Uncle     Past Medical History  Diagnosis Date  . Hypertension   . Hyperlipidemia   . BPH (benign prostatic hypertrophy)   . Hemorrhoids   . Insomnia   . Heart murmur 1497    LVH, diastolic dysfunction, aortic sclerosis  . Erectile dysfunction   . Fecal incontinence   . Colon adenoma   .  Ejection fraction   . Kidney stones     "passed them"  . Coronary artery disease     Past Surgical History  Procedure Laterality Date  . Skin lesion excision Right 2013    precancerous lesion from arm  . Tonsillectomy    . Coronary angioplasty with stent placement  12/31/2013    "3"  . Prostate biopsy  ~ 1991  . Left heart catheterization with coronary angiogram N/A 12/31/2013    Procedure: LEFT HEART CATHETERIZATION WITH CORONARY ANGIOGRAM;  Surgeon: Sinclair Grooms, MD;  Location: [facility] CATH LAB;  Service: Cardiovascular;  Laterality: N/A; Ostial and mid LAD stenting    Patient Active Problem List   Diagnosis Date Noted  . Coronary artery disease involving native artery of transplanted heart without angina pectoris 01/17/2014  . Erectile dysfunction 01/17/2014  . Abnormal nuclear stress test 12/31/2013  . Allergy to statin medication 12/30/2013  . Elevated PSA 12/30/2013  . Aortic stenosis 12/30/2013  . Ejection fraction   . Hyperlipidemia 07/29/2006  . Essential hypertension 07/29/2006  . BENIGN PROSTATIC HYPERTROPHY 07/29/2006  . NEPHROLITHIASIS, HX OF 07/29/2006    ROS  Patient denies fever, chills, headache, sweats, rash, change in vision, change in hearing, chest pain, cough, nausea or vomiting, urinary symptoms. All other systems are reviewed and are negative.  PHYSICAL EXAM Patient is oriented to person time and place. Affect is normal. He is here with his wife. He is overweight. Head is atraumatic. Sclera and conjunctiva are normal. There is no jugulovenous distention. Lungs are clear. Respiratory effort is nonlabored. Cardiac exam reveals S1 and S2. The abdomen is soft. There is no peripheral edema. There are no musculoskeletal deformities. There are no skin rashes. The cath site at his right radial artery is completely healed.  Filed Vitals:   01/18/14 1529  BP: 158/84  Pulse: 69  Height: _0  (1.778 m)  Weight: 240 lb (108.863 kg)   EKG is done today and  reviewed by me. EKG is normal.  ASSESSMENT & PLAN

## 2014-01-19 ENCOUNTER — Telehealth: Payer: Self-pay | Admitting: *Deleted

## 2014-01-19 NOTE — Telephone Encounter (Signed)
Patient requests an rx for nitro. Not on med list and he stated that he has never had this before. Ok to refill? Please advise. Thanks, MI

## 2014-01-19 NOTE — Telephone Encounter (Signed)
Called patient back.  Informed patient that while he is taking Cialis he cannot take nitroglycerin. Patient was under the impression that Dr. Ron Parker wanted him on nitroglycerin. Reiterated per Dr. Ron Parker notes on 01/18/14 " The patient has significant BPH. He is being treated with Cialis for this and erectile dysfunction. We have had him off this medication during this period of treatment of his coronary disease. I am going to allow him to resume the medication. He is aware that he cannot use nitroglycerin" Informed patient that this is a dangerous combination. Patient verbalized understanding. Will forward to Dr. Ron Parker to see if changes need to be made.

## 2014-01-20 NOTE — Telephone Encounter (Signed)
**Note De-identified Vanderbilt Ranieri Obfuscation** The pt is advised and he verbalized understanding. 

## 2014-01-20 NOTE — Telephone Encounter (Signed)
I agree that we will not be giving the patient nitroglycerin at this time

## 2014-02-18 ENCOUNTER — Ambulatory Visit (INDEPENDENT_AMBULATORY_CARE_PROVIDER_SITE_OTHER): Payer: Medicare Other | Admitting: Cardiology

## 2014-02-18 ENCOUNTER — Encounter: Payer: Self-pay | Admitting: Cardiology

## 2014-02-18 VITALS — BP 142/80 | HR 56 | Ht 70.0 in | Wt 242.4 lb

## 2014-02-18 DIAGNOSIS — E785 Hyperlipidemia, unspecified: Secondary | ICD-10-CM

## 2014-02-18 DIAGNOSIS — I25811 Atherosclerosis of native coronary artery of transplanted heart without angina pectoris: Secondary | ICD-10-CM

## 2014-02-18 DIAGNOSIS — N528 Other male erectile dysfunction: Secondary | ICD-10-CM

## 2014-02-18 DIAGNOSIS — I350 Nonrheumatic aortic (valve) stenosis: Secondary | ICD-10-CM

## 2014-02-18 NOTE — Assessment & Plan Note (Signed)
Fortunately he is doing very well after his interventions. We need aggressive secondary prevention. Unfortunately he is statin intolerant. I've discussed this further under hyperlipidemia.

## 2014-02-18 NOTE — Assessment & Plan Note (Signed)
Patient has very mild aortic stenosis. This will be followed.

## 2014-02-18 NOTE — Progress Notes (Signed)
Cardiology Office Note   Date:  02/18/2014   ID:  Blackwell, Jason [birth year], MRN [id number]  PCP:  Jason Shelling, MD  Cardiologist:  Jason Argyle, MD   Chief Complaint  Patient presents with  . Appointment    Follow-up coronary artery disease      History of Present Illness: Jason Blackwell is a 69 y.o. male who presents for follow-up of coronary artery disease. I saw him last January 18, 2014. This visit was early post hospitalization for severe LAD disease and stenting. He's feeling much better. We have been adjusting his blood pressure medicines. He mentions that he is having a problem with increased erectile dysfunction. He's been taking half of his chlorthalidone tablet. His systolic pressure is running in the range of 140-145.    Past Medical History  Diagnosis Date  . Hypertension   . Hyperlipidemia   . BPH (benign prostatic hypertrophy)   . Hemorrhoids   . Insomnia   . Heart murmur 1751    LVH, diastolic dysfunction, aortic sclerosis  . Erectile dysfunction   . Fecal incontinence   . Colon adenoma   . Ejection fraction   . Kidney stones     "passed them"  . Coronary artery disease     Past Surgical History  Procedure Laterality Date  . Skin lesion excision Right 2013    precancerous lesion from arm  . Tonsillectomy    . Coronary angioplasty with stent placement  12/31/2013    "3"  . Prostate biopsy  ~ 1991  . Left heart catheterization with coronary angiogram N/A 12/31/2013    Procedure: LEFT HEART CATHETERIZATION WITH CORONARY ANGIOGRAM;  Surgeon: Jason Grooms, MD;  Location: [facility] CATH LAB;  Service: Cardiovascular;  Laterality: N/A; Ostial and mid LAD stenting    Patient Active Problem List   Diagnosis Date Noted  . Coronary artery disease involving native artery of transplanted heart without angina pectoris 01/17/2014  . Erectile dysfunction 01/17/2014  . Allergy to statin medication 12/30/2013  . Elevated PSA 12/30/2013  .  Aortic stenosis 12/30/2013  . Ejection fraction   . Hyperlipidemia 07/29/2006  . Essential hypertension 07/29/2006  . BENIGN PROSTATIC HYPERTROPHY 07/29/2006  . NEPHROLITHIASIS, HX OF 07/29/2006      Current Outpatient Prescriptions  Medication Sig Dispense Refill  . AMLODIPINE BESYLATE PO Take 10 mg by mouth daily.    [name] aspirin EC 81 MG tablet Take 81 mg by mouth daily.    . chlorthalidone (HYGROTON) 25 MG tablet Take 1 tablet (25 mg total) by mouth daily. 30 tablet 3  . CIALIS 5 MG tablet Take 5 mg by mouth daily as needed.   10  . hydrocortisone (ANUSOL-HC) 2.5 % rectal cream Place 1 application rectally 2 (two) times daily.    . Lactobacillus-Inulin ([place] PO) Take by mouth daily.    [name] losartan (COZAAR) 100 MG tablet Take 100 mg by mouth daily.    . Meth-Hyo-M Bl-Na Phos-Ph Sal (URIBEL) 118 MG CAPS Take 1 capsule by mouth daily.    . simethicone (MYLICON) 80 MG chewable tablet Chew 80 mg by mouth as directed. Take 5 tablets a day.  1 in the morning 2 at lunch and 2 at supper    . ticagrelor (BRILINTA) 90 MG TABS tablet Take 1 tablet (90 mg total) by mouth 2 (two) times daily. 60 tablet 10  . zolpidem (AMBIEN) 10 MG tablet Take 10 mg by mouth at bedtime as needed for  sleep. Take one half to one tablet as needed     No current facility-administered medications for this visit.    Allergies:   Simvastatin; Succinylcholine chloride; and Sulfamethoxazole    Social History:  The patient  reports that he has never smoked. He has never used smokeless tobacco. He reports that he drinks about 16.8 oz of alcohol per week. He reports that he does not use illicit drugs.   Family History:  The patient's family history includes Cancer - Colon in his maternal uncle; Diabetes in his sister; Heart attack in his father; Heart disease in his mother.    ROS:  Please see the history of present illness.    Patient denies fever, chills, headache, sweats, rash, change in vision,  change in hearing, chest pain, cough, nausea or vomiting, urinary symptoms. All other systems are reviewed and are negative.    PHYSICAL EXAM: VS:  BP 142/80 mmHg  Pulse 56  Ht 5\' 10"  (1.778 m)  Wt 242 lb 6.4 oz (109.952 kg)  BMI 34.78 kg/m2 , Patient is here with his wife. He is oriented to person time and place. Affect is normal. Head is atraumatic. Sclera and conjunctiva are normal. There is no jugulovenous distention. Lungs are clear. Respiratory effort is nonlabored. He is overweight. Cardiac exam reveals S1 and S2. Abdomen is soft. There is no peripheral edema. There are no musculoskeletal deformities. There are no skin rashes.  EKG: EKG is not done today.     Recent Labs: 01/01/2014: BUN 14; Creatinine 0.71; Hemoglobin 12.6*; Platelets 249; Potassium 3.2*; Sodium 142    Lipid Panel No results found for: CHOL, TRIG, HDL, CHOLHDL, VLDL, LDLCALC, LDLDIRECT    Wt Readings from Last 3 Encounters:  02/18/14 242 lb 6.4 oz (109.952 kg)  01/18/14 240 lb (108.863 kg)  01/01/14 241 lb 6.5 oz (109.5 kg)      Current medicines are reviewed. He understands his meds well. Specific medicine recommendations are listed below within the assessment and plan.       ASSESSMENT AND PLAN:

## 2014-02-18 NOTE — Patient Instructions (Signed)
**Note De-Identified Benji Poynter Obfuscation** Your physician has recommended you make the following change in your medication: Take 1/2 tablet of your Metoprolol daily for 3 days then stop taking.  Your physician recommends that you schedule a follow-up appointment in: 6 weeks  Please call Jeani Hawking (Dr Kae Heller nurse) at [phone] in a week to 10 days to let us know how you are doing.

## 2014-02-18 NOTE — Assessment & Plan Note (Signed)
I'm quite concerned about his lipids. He has an LDL in the range of 180. I certainly agree with the addition of Zetia. I have talked to him about the possibility of starting a PCSK9 inhibitor. I have encouraged him to speak with Gay Filler in our office about the possibility of his obtaining this medicine. He says that he will communicate back to me whether he is willing to do this. I made it clear to him that finances are often difficulty with this new medication. Would also have to prove that he is truly stat and intolerant.  As part of today's evaluation I spent greater than 25 minutes with the patient's total care. More than half of this time was with direct contact with the patient and his wife. I had a very extensive discussion with him about his medications and the options that we have. I answered many questions.

## 2014-02-18 NOTE — Assessment & Plan Note (Signed)
He cut his chlorthalidone dose in half. He thinks this may have helped. I'm wondering if metoprolol could be playing a role. It will be safe for him to be off metoprolol. This will be tapered to half dose and then stop completely. Over the next week to 10 days he will communicate back to [place] what his blood pressures running and how he is doing from the viewpoint of erectile dysfunction.

## 2014-02-26 ENCOUNTER — Encounter: Payer: Self-pay | Admitting: Cardiology

## 2014-04-19 ENCOUNTER — Encounter: Payer: Self-pay | Admitting: Cardiology

## 2014-04-19 ENCOUNTER — Ambulatory Visit (INDEPENDENT_AMBULATORY_CARE_PROVIDER_SITE_OTHER): Payer: Medicare Other | Admitting: Cardiology

## 2014-04-19 VITALS — BP 128/60 | HR 59 | Ht 70.0 in | Wt 239.8 lb

## 2014-04-19 DIAGNOSIS — I10 Essential (primary) hypertension: Secondary | ICD-10-CM | POA: Diagnosis not present

## 2014-04-19 DIAGNOSIS — E785 Hyperlipidemia, unspecified: Secondary | ICD-10-CM | POA: Diagnosis not present

## 2014-04-19 DIAGNOSIS — I2510 Atherosclerotic heart disease of native coronary artery without angina pectoris: Secondary | ICD-10-CM

## 2014-04-19 NOTE — Assessment & Plan Note (Signed)
Blood pressures controlled. No change in therapy. 

## 2014-04-19 NOTE — Assessment & Plan Note (Signed)
The patient is doing well after his coronary intervention. Dual antiplatelet therapy is to be continued.

## 2014-04-19 NOTE — Patient Instructions (Signed)
Your physician recommends that you continue on your current medications as directed. Please refer to the Current Medication list given to you today. Your physician wants you to follow-up in: 4 months You will receive a reminder letter in the mail two months in advance. If you don't receive a letter, please call our office to schedule the follow-up appointment.  

## 2014-04-19 NOTE — Assessment & Plan Note (Signed)
The patient is said to have significant statin intolerance with a reaction occurring at the [facility] in the past. We added Zetia and he has had a good numerical response. However his LDL remains 140. My recommendation would still to be consider PCSK9 therapy in his case. I've explained to him that our pharmacy team could help look into this for him if he would like. He is still considering.

## 2014-04-19 NOTE — Progress Notes (Signed)
Cardiology Office Note   Date:  04/19/2014   ID:  Jason Blackwell, DOB 01-11-1946, MRN [id number]  PCP:  Irven Shelling, MD  Cardiologist:  Dola Argyle, MD   Chief Complaint  Patient presents with  . Appointment    Follow-up coronary artery disease      History of Present Illness: Jason Blackwell is a 69 y.o. male who presents today to follow-up coronary artery disease. Fortunately he is remaining stable. He's not having any significant chest pain.  Past Medical History  Diagnosis Date  . Hypertension   . Hyperlipidemia   . BPH (benign prostatic hypertrophy)   . Hemorrhoids   . Insomnia   . Heart murmur 2585    LVH, diastolic dysfunction, aortic sclerosis  . Erectile dysfunction   . Fecal incontinence   . Colon adenoma   . Ejection fraction   . Kidney stones     "passed them"  . Coronary artery disease     Past Surgical History  Procedure Laterality Date  . Skin lesion excision Right 2013    precancerous lesion from arm  . Tonsillectomy    . Coronary angioplasty with stent placement  12/31/2013    "3"  . Prostate biopsy  ~ 1991  . Left heart catheterization with coronary angiogram N/A 12/31/2013    Procedure: LEFT HEART CATHETERIZATION WITH CORONARY ANGIOGRAM;  Surgeon: Sinclair Grooms, MD;  Location: [facility] CATH LAB;  Service: Cardiovascular;  Laterality: N/A; Ostial and mid LAD stenting    Patient Active Problem List   Diagnosis Date Noted  . Coronary artery disease involving native artery of transplanted heart without angina pectoris 01/17/2014  . Erectile dysfunction 01/17/2014  . Allergy to statin medication 12/30/2013  . Elevated PSA 12/30/2013  . Aortic stenosis 12/30/2013  . Ejection fraction   . Hyperlipidemia 07/29/2006  . Essential hypertension 07/29/2006  . BENIGN PROSTATIC HYPERTROPHY 07/29/2006  . NEPHROLITHIASIS, HX OF 07/29/2006      Current Outpatient Prescriptions  Medication Sig Dispense Refill  . AMLODIPINE BESYLATE PO  Take 10 mg by mouth daily.    [name] aspirin EC 81 MG tablet Take 81 mg by mouth daily.    . chlorthalidone (HYGROTON) 25 MG tablet Take 1 tablet (25 mg total) by mouth daily. 30 tablet 3  . CIALIS 5 MG tablet Take 5 mg by mouth daily as needed.   10  . ezetimibe (ZETIA) 10 MG tablet Take 10 mg by mouth daily.    . hydrocortisone (ANUSOL-HC) 2.5 % rectal cream Place 1 application rectally 2 (two) times daily.    . Lactobacillus-Inulin ([place] PO) Take by mouth daily.    [name] losartan (COZAAR) 100 MG tablet Take 100 mg by mouth daily.    . Meth-Hyo-M Bl-Na Phos-Ph Sal (URIBEL) 118 MG CAPS Take 1 capsule by mouth daily.    . simethicone (MYLICON) 80 MG chewable tablet Chew 80 mg by mouth as directed. Take 5 tablets a day.  1 in the morning 2 at lunch and 2 at supper    . ticagrelor (BRILINTA) 90 MG TABS tablet Take 1 tablet (90 mg total) by mouth 2 (two) times daily. 60 tablet 10  . zolpidem (AMBIEN) 10 MG tablet Take 10 mg by mouth at bedtime as needed for sleep. Take one half to one tablet as needed     No current facility-administered medications for this visit.    Allergies:   Simvastatin; Succinylcholine chloride; and Sulfamethoxazole    Social History:  The patient  reports that he has never smoked. He has never used smokeless tobacco. He reports that he drinks about 16.8 oz of alcohol per week. He reports that he does not use illicit drugs.   Family History:  The patient's family history includes Cancer - Colon in his maternal uncle; Diabetes in his sister; Heart attack in his father; Heart disease in his mother.    ROS:  Please see the history of present illness.   Patient denies fever, chills, headache, sweats, rash, change in vision, change in hearing, chest pain, cough, nausea or vomiting, urinary symptoms. All other systems are reviewed and are negative.   PHYSICAL EXAM: VS:  BP 128/60 mmHg  Pulse 59  Ht 5\' 10"  (1.778 m)  Wt 239 lb 12.8 oz (108.773 kg)  BMI  34.41 kg/m2  SpO2 97% , Patient is here with his wife. He is overweight. He is oriented to person time and place. Affect is normal. Head is atraumatic. Sclera and conjunctiva are normal. There is no jugulovenous distention. Lungs are clear. Respiratory effort is nonlabored. Cardiac exam reveals an S1 and S2. The abdomen is soft. There is no peripheral edema. There are no musculoskeletal deformities. There are no skin rashes.  EKG:   EKG is not done today.   Recent Labs: 01/01/2014: BUN 14; Creatinine 0.71; Hemoglobin 12.6*; Platelets 249; Potassium 3.2*; Sodium 142    Lipid Panel No results found for: CHOL, TRIG, HDL, CHOLHDL, VLDL, LDLCALC, LDLDIRECT    Wt Readings from Last 3 Encounters:  04/19/14 239 lb 12.8 oz (108.773 kg)  02/18/14 242 lb 6.4 oz (109.952 kg)  01/18/14 240 lb (108.863 kg)      Current medicines are reviewed  Patient understands his medications.     ASSESSMENT AND PLAN:

## 2014-05-16 ENCOUNTER — Other Ambulatory Visit: Payer: Self-pay | Admitting: Cardiology

## 2014-06-16 ENCOUNTER — Telehealth: Payer: Self-pay | Admitting: Cardiology

## 2014-06-16 NOTE — Telephone Encounter (Signed)
New Prob   Pt is requesting to speak to a nurse regarding his stents and Brilinta medication. Please call.

## 2014-06-16 NOTE — Telephone Encounter (Signed)
The pt states that he has bleeding hemorrhoids that have gotten worse within the last month. He complains that it is difficult to stop the bleeding. He has been taking Brilinta 90 mg BID since receiving stents in 12/15. He understands that he must take Brilinta for a year after stent placement but wants to know if the dose can be cut back to 90 mg once daily and, if not, does Dr Ron Parker have any recommendations. He is advised that I am sending this message to Dr Ron Parker and that I will call him back with his recommendations if any.

## 2014-06-16 NOTE — Telephone Encounter (Signed)
The Brilinta dose cannot be decreased. We need to change him to Plavix 75 mg once a day. Please arrange to do this so that he takes 75 mg of Plavix on the morning of the first day that he does not take Brilinta. Also, he needs to talk with his primary physician about very aggressive medical therapy of his hemorrhoids. We will get into a very difficult situation if we cannot stabilize this bleeding.

## 2014-06-17 MED ORDER — CLOPIDOGREL BISULFATE 75 MG PO TABS: 75.0000 mg | ORAL_TABLET | Freq: Every day | ORAL | Status: DC

## 2014-06-17 NOTE — Telephone Encounter (Signed)
**Note De-identified Cai Flott Obfuscation** LMTCB

## 2014-06-17 NOTE — Telephone Encounter (Signed)
**Note De-Identified Adolphe Fortunato Obfuscation** The pt is advised and he verbalized understanding and agrees with the plan. Per his request I have sent his Plavix RX to [facility] [employer] on [address] to fill.

## 2014-08-03 ENCOUNTER — Telehealth: Payer: Self-pay | Admitting: Cardiology

## 2014-08-03 NOTE — Telephone Encounter (Signed)
**Note De-identified Jason Blackwell Obfuscation** Please advise 

## 2014-08-03 NOTE — Telephone Encounter (Signed)
**Note De-Identified Deborrah Mabin Obfuscation** This message/note has been faxed to Dr. Arita Miss, DDS office Attn: Peggy @ [phone].

## 2014-08-03 NOTE — Telephone Encounter (Signed)
Plavix and aspirin cannot be stopped. Dental work can be done on Plavix and aspirin. Antibiotics are not needed.

## 2014-08-03 NOTE — Telephone Encounter (Signed)
New message      1. What dental office are you calling from? Arita Miss DDS  2. What is your office phone and fax number? Fax [phone]  3. What type of procedure is the patient having performed?  Cleaning and exam  What date is procedure scheduled?  Pending clearance 4. What is your question (ex. Antibiotics prior to procedure, holding medication-we need to know how long dentist wants pt to hold med)?  Pt recently put on plavix 6wk ago--can he get a cleaning and should he hold plavix

## 2014-10-09 ENCOUNTER — Other Ambulatory Visit: Payer: Self-pay | Admitting: Cardiology

## 2014-10-11 ENCOUNTER — Encounter: Payer: Self-pay | Admitting: Cardiology

## 2014-10-11 ENCOUNTER — Ambulatory Visit (INDEPENDENT_AMBULATORY_CARE_PROVIDER_SITE_OTHER): Payer: Medicare Other | Admitting: Cardiology

## 2014-10-11 VITALS — BP 144/90 | HR 73 | Ht 70.0 in | Wt 245.0 lb

## 2014-10-11 DIAGNOSIS — K648 Other hemorrhoids: Secondary | ICD-10-CM

## 2014-10-11 DIAGNOSIS — E785 Hyperlipidemia, unspecified: Secondary | ICD-10-CM

## 2014-10-11 DIAGNOSIS — K649 Unspecified hemorrhoids: Secondary | ICD-10-CM | POA: Insufficient documentation

## 2014-10-11 DIAGNOSIS — I350 Nonrheumatic aortic (valve) stenosis: Secondary | ICD-10-CM

## 2014-10-11 DIAGNOSIS — I25118 Atherosclerotic heart disease of native coronary artery with other forms of angina pectoris: Secondary | ICD-10-CM | POA: Diagnosis not present

## 2014-10-11 NOTE — Assessment & Plan Note (Addendum)
Patient has mild to moderate aortic stenosis.. There was a very mild gradient in the cath lab in December, 2015. No further workup at this time.Marland KitchenMarland Kitchen

## 2014-10-11 NOTE — Assessment & Plan Note (Addendum)
The patient had significant LAD disease at catheterization in December, 2015. He received several stents. There was residual indentation of the proximal margin of the LAD. Because of this consideration is to be given to longer anti-platelet therapy than usual. The patient is not having any significant symptoms. He hopes to be able to hold his Plavix at some point to have his hemorrhoids treated. He does have intermittent bleeding. I told him we cannot do this electively at this time. Previously his procedure was done by Dr. Tamala Julian. He would like to follow with Dr. Tamala Julian. I have arranged for follow-up visit so that the patient and Dr. Tamala Julian can decide if and when his Plavix can be held to have hemorrhoids treated.[name]

## 2014-10-11 NOTE — Progress Notes (Signed)
Cardiology Office Note   Date:  10/11/2014   ID:  Jason Blackwell, Jason Blackwell November 11, 1945, MRN [id number]  PCP:  Irven Shelling, MD  Cardiologist:  Dola Argyle, MD   Chief Complaint  Patient presents with  . Appointment    Follow-up coronary disease      History of Present Illness: Jason Blackwell is a 69 y.o. male who presents today to follow up coronary disease. He is doing very well. He received a stent in December, 2015. He is on dual platelet therapy. He's not having any of his original symptom. He is having some intermittent hemorrhoidal bleeding. He understands that if it is only minor bleeding, we will want to wait a few more months before allowing him to come off Plavix. He would like to follow-up with Dr. Tamala Julian who did his intervention.    Past Medical History  Diagnosis Date  . Hypertension   . Hyperlipidemia   . BPH (benign prostatic hypertrophy)   . Hemorrhoids   . Insomnia   . Heart murmur 0623    LVH, diastolic dysfunction, aortic sclerosis  . Erectile dysfunction   . Fecal incontinence   . Colon adenoma   . Ejection fraction   . Kidney stones     "passed them"  . Coronary artery disease     Past Surgical History  Procedure Laterality Date  . Skin lesion excision Right 2013    precancerous lesion from arm  . Tonsillectomy    . Coronary angioplasty with stent placement  12/31/2013    "3"  . Prostate biopsy  ~ 1991  . Left heart catheterization with coronary angiogram N/A 12/31/2013    Procedure: LEFT HEART CATHETERIZATION WITH CORONARY ANGIOGRAM;  Surgeon: Sinclair Grooms, MD;  Location: [facility] CATH LAB;  Service: Cardiovascular;  Laterality: N/A; Ostial and mid LAD stenting    Patient Active Problem List   Diagnosis Date Noted  . CAD (coronary artery disease), native coronary artery 01/17/2014  . Erectile dysfunction 01/17/2014  . Allergy to statin medication 12/30/2013  . Elevated PSA 12/30/2013  . Aortic stenosis 12/30/2013  . Ejection  fraction   . Hyperlipidemia 07/29/2006  . Essential hypertension 07/29/2006  . BENIGN PROSTATIC HYPERTROPHY 07/29/2006  . NEPHROLITHIASIS, HX OF 07/29/2006      Current Outpatient Prescriptions  Medication Sig Dispense Refill  . AMLODIPINE BESYLATE PO Take 10 mg by mouth daily.    [name] aspirin EC 81 MG tablet Take 81 mg by mouth daily.    . chlorthalidone (HYGROTON) 25 MG tablet TAKE 1 TABLET (25 MG TOTAL) BY MOUTH DAILY. 30 tablet 3  . clopidogrel (PLAVIX) 75 MG tablet TAKE 1 TABLET BY MOUTH DAILY. 90 tablet 1  . ezetimibe (ZETIA) 10 MG tablet Take 10 mg by mouth daily.    . hydrocortisone (ANUSOL-HC) 2.5 % rectal cream Place 1 application rectally 2 (two) times daily.    . Lactobacillus-Inulin ([place] PO) Take by mouth daily.    [name] losartan (COZAAR) 100 MG tablet Take 100 mg by mouth daily.    . Meth-Hyo-M Bl-Na Phos-Ph Sal (URIBEL) 118 MG CAPS Take 1 capsule by mouth daily.    . sildenafil (REVATIO) 20 MG tablet Take 20 mg by mouth every 12 (twelve) hours as needed. FOR ED    . simethicone (MYLICON) 80 MG chewable tablet Chew 80 mg by mouth as directed. Take 5 tablets a day.  1 in the morning 2 at lunch and 2 at supper    .  zolpidem (AMBIEN) 10 MG tablet Take 10 mg by mouth at bedtime as needed for sleep. Take one half to one tablet as needed     No current facility-administered medications for this visit.    Allergies:   Simvastatin; Succinylcholine chloride; and Sulfamethoxazole    Social History:  The patient  reports that he has never smoked. He has never used smokeless tobacco. He reports that he drinks about 16.8 oz of alcohol per week. He reports that he does not use illicit drugs.   Family History:  The patient's family history includes Cancer - Colon in his maternal uncle; Diabetes in his sister; Heart attack in his father; Heart disease in his mother.    ROS:  Please see the history of present illness.     Patient denies fever, chills, headache, sweats,  rash, change in vision, change in hearing, chest pain, cough, nausea or vomiting, urinary symptoms. All of the systems are reviewed and are negative.   PHYSICAL EXAM: VS:  BP 144/90 mmHg  Pulse 73  Ht 5\' 10"  (1.778 m)  Wt 245 lb (111.131 kg)  BMI 35.15 kg/m2 ,  The patient is overweight. He is stable. He is here with his wife. He is oriented to person time and place. Affect is normal. Head is atraumatic. Sclera and conjunctiva was normal. There is no jugular venous distention. Lungs are clear. Respiratory effort is nonlabored. Cardiac exam reveals S1 and S2. Abdomen is soft. There is no peripheral edema. There are no musculoskeletal deformities. There are no skin rashes.   EKG:   EKG is not done today.   Recent Labs: 01/01/2014: BUN 14; Creatinine, Ser 0.71; Hemoglobin 12.6*; Platelets 249; Potassium 3.2*; Sodium 142    Lipid Panel No results found for: CHOL, TRIG, HDL, CHOLHDL, VLDL, LDLCALC, LDLDIRECT    Wt Readings from Last 3 Encounters:  10/11/14 245 lb (111.131 kg)  04/19/14 239 lb 12.8 oz (108.773 kg)  02/18/14 242 lb 6.4 oz (109.952 kg)      Current medicines are reviewed  The patient understands his medicines.     ASSESSMENT AND PLAN:

## 2014-10-11 NOTE — Assessment & Plan Note (Addendum)
The patient gives a history of significant reactions to statins in the time that he was treated at the [facility]. Therefore he is not willing to take a statin. I have explained to him that I would encourage the possibility of using a PCSK9. However at this point he is not interested in following through with evaluation by the lipid clinic.Jason Blackwell

## 2014-10-11 NOTE — Telephone Encounter (Signed)
Carlena Bjornstad, MD at 06/16/2014 6:04 PM   The Brilinta dose cannot be decreased. We need to change him to Plavix 75 mg once a day. Please arrange to do this so that he takes 75 mg of Plavix on the morning of the first day that he does not take Brilinta. Also, he needs to talk with his primary physician about very aggressive medical therapy of his hemorrhoids. We will get into a very difficult situation if we cannot stabilize this bleeding.

## 2014-10-11 NOTE — Patient Instructions (Signed)
Medication Instructions:  Same-no changes  Labwork: None  Testing/Procedures: None  Follow-Up: Your physician recommends that you schedule a follow-up appointment in: 2 to 3 months with Dr Tamala Julian

## 2014-10-12 ENCOUNTER — Other Ambulatory Visit: Payer: Self-pay | Admitting: *Deleted

## 2014-10-12 MED ORDER — CLOPIDOGREL BISULFATE 75 MG PO TABS: 75.0000 mg | ORAL_TABLET | Freq: Every day | ORAL | Status: DC

## 2014-10-12 NOTE — Telephone Encounter (Signed)
[facility]armacist from [facility] [place] called and stated that the patient actually wanted the rx for [facility]idogrel [facility] of [facility] [address]. I will re-send and the pharmacist will have the rx that was sent in yesterday cancelled.

## 2014-12-20 ENCOUNTER — Telehealth: Payer: Self-pay | Admitting: Interventional Cardiology

## 2014-12-20 NOTE — Telephone Encounter (Signed)
New message    Patient calling almost a year from his last stent placement  .   Pt C/O medication issue:  1. Name of Medication: Plavix 75 mg   2. How are you currently taking this medication (dosage and times per day)? In am    3. Are you having a reaction (difficulty breathing--STAT)? During bowel movement   4. What is your medication issue? Wants to discontinue

## 2014-12-20 NOTE — Telephone Encounter (Signed)
Returned pt call. Previous pt of Dr.Katz. Dr.Smith did his intervention in 12/2013. Pt scheduled to establish with Dr.Smith on 12/20. Pt sts that he is currently taking Plavix 75mg  and 81mg  daily. Pt sts that he does have hemorrhoids, and has been having a lot of bleeding with each BM Pt sts that he was at a holiday party this weekend, he felt some thing wet,he went to th restroom to discover he bleed all over his underwear. Pt sts that Dr.Katz adv him previously that he would be able to d/c Plavix in 1 year. Pt would like to stop Plavix if Dr.Smith feels it is safe. Adv pt that Dr.Smith is not in the office.Adv pt that I will fwd Dr.Smith a message and call back with his response. Pt agreeable with plan and verbalized understanding.

## 2014-12-21 NOTE — Telephone Encounter (Signed)
See recommendation below.

## 2014-12-21 NOTE — Telephone Encounter (Signed)
It would not be a good idea to discontinue Plavix. His LAD stent is then an ostial position and long-term dual antiplatelet therapy is going to be recommended.

## 2014-12-21 NOTE — Telephone Encounter (Signed)
Pt aware of Dr.Smith's response below. Pt will f/u as planned with Dr.Smith on 12/20. Pt will continue dual antiplatelet therapy and discuss further with Dr.Smith at his upcoming appt

## 2014-12-29 ENCOUNTER — Ambulatory Visit: Payer: Medicare Other | Admitting: Interventional Cardiology

## 2015-01-04 ENCOUNTER — Ambulatory Visit (INDEPENDENT_AMBULATORY_CARE_PROVIDER_SITE_OTHER): Payer: Medicare Other | Admitting: Interventional Cardiology

## 2015-01-04 ENCOUNTER — Encounter: Payer: Self-pay | Admitting: Interventional Cardiology

## 2015-01-04 DIAGNOSIS — I10 Essential (primary) hypertension: Secondary | ICD-10-CM | POA: Diagnosis not present

## 2015-01-04 DIAGNOSIS — E785 Hyperlipidemia, unspecified: Secondary | ICD-10-CM | POA: Diagnosis not present

## 2015-01-04 DIAGNOSIS — I350 Nonrheumatic aortic (valve) stenosis: Secondary | ICD-10-CM | POA: Diagnosis not present

## 2015-01-04 DIAGNOSIS — I2510 Atherosclerotic heart disease of native coronary artery without angina pectoris: Secondary | ICD-10-CM

## 2015-01-04 NOTE — Patient Instructions (Signed)
Medication Instructions:  Your physician recommends that you continue on your current medications as directed. Please refer to the Current Medication list given to you today.   Labwork: None ordered  Testing/Procedures: None ordered  Follow-Up: Your physician wants you to follow-up in: 6 months with Dr. Smith. You will receive a reminder letter in the mail two months in advance. If you don't receive a letter, please call our office to schedule the follow-up appointment.   Any Other Special Instructions Will Be Listed Below (If Applicable).     If you need a refill on your cardiac medications before your next appointment, please call your pharmacy.   

## 2015-01-04 NOTE — Progress Notes (Signed)
Cardiology Office Note   Date:  01/04/2015   ID:  Jason, Blackwell 02-22-1945, MRN [id number]  PCP:  Irven Shelling, MD  Cardiologist:  Sinclair Grooms, MD   Chief Complaint  Patient presents with  . Congestive Heart Failure      History of Present Illness: Jason Blackwell is a 69 y.o. male who presents for follow-up of coronary artery disease, hypertension, hyperlipidemia, and recent history of rectal bleeding.  The patient underwent complex LAD angioplasty in December 2015 after a high risk myocardial perfusion study. He has an ostial LAD stent DES (4.0) and overlapping mid LAD stents. (3.0). Ischemic symptoms have completely resolved. Since starting dual antiplatelet therapy, he has had recurrent episodes of rectal bleeding. Brilinta was switched to Plavix. Episodes of bleeding have continued to be an issue. With activity, dyspnea and chest pressure has completely resolved. Overall he feels improved from the cardiac standpoint.  Past Medical History  Diagnosis Date  . Hypertension   . Hyperlipidemia   . BPH (benign prostatic hypertrophy)   . Hemorrhoids   . Insomnia   . Heart murmur [id number]    LVH, diastolic dysfunction, aortic sclerosis  . Erectile dysfunction   . Fecal incontinence   . Colon adenoma   . Ejection fraction   . Kidney stones     "passed them"  . Coronary artery disease     Past Surgical History  Procedure Laterality Date  . Skin lesion excision Right 2013    precancerous lesion from arm  . Tonsillectomy    . Coronary angioplasty with stent placement  12/31/2013    "3"  . Prostate biopsy  ~ 1991  . Left heart catheterization with coronary angiogram N/A 12/31/2013    Procedure: LEFT HEART CATHETERIZATION WITH CORONARY ANGIOGRAM;  Surgeon: Sinclair Grooms, MD;  Location: [facility] CATH LAB;  Service: Cardiovascular;  Laterality: N/A; Ostial and mid LAD stenting     Current Outpatient Prescriptions  Medication Sig Dispense Refill  .  AMLODIPINE BESYLATE PO Take 10 mg by mouth daily.    [name] aspirin EC 81 MG tablet Take 81 mg by mouth daily.    . chlorthalidone (HYGROTON) 25 MG tablet TAKE 1 TABLET (25 MG TOTAL) BY MOUTH DAILY. 30 tablet 3  . clopidogrel (PLAVIX) 75 MG tablet Take 1 tablet (75 mg total) by mouth daily. 90 tablet 1  . ezetimibe (ZETIA) 10 MG tablet Take 10 mg by mouth daily.    . hydrocortisone (ANUSOL-HC) 2.5 % rectal cream Place 1 application rectally 2 (two) times daily.    . Lactobacillus-Inulin ([place] PO) Take by mouth daily.    [name] losartan (COZAAR) 100 MG tablet Take 100 mg by mouth daily.    . Meth-Hyo-M Bl-Na Phos-Ph Sal (URIBEL) 118 MG CAPS Take 1 capsule by mouth daily as needed (burning when urinating).    . sildenafil (REVATIO) 20 MG tablet Take 20 mg by mouth every 12 (twelve) hours as needed. FOR ED    . simethicone (MYLICON) 80 MG chewable tablet Chew 80 mg by mouth as directed. Take 5 tablets a day.  1 in the morning 2 at lunch and 2 at supper    . zolpidem (AMBIEN) 10 MG tablet Take 10 mg by mouth at bedtime as needed for sleep. Take one half to one tablet as needed     No current facility-administered medications for this visit.    Allergies:   Simvastatin; Succinylcholine chloride; and Sulfamethoxazole  Social History:  The patient  reports that he has never smoked. He has never used smokeless tobacco. He reports that he drinks about 16.8 oz of alcohol per week. He reports that he does not use illicit drugs.   Family History:  The patient's family history includes Cancer - Colon in his maternal uncle; Diabetes in his sister; Heart attack in his father; Heart disease in his mother.    ROS:  Please see the history of present illness.   Otherwise, review of systems are positive for joints.   All other systems are reviewed and negative.    PHYSICAL EXAM: VS:  BP 150/88 mmHg  Pulse 63  Ht 5\' 10"  (1.778 m)  Wt 245 lb 12.8 oz (111.494 kg)  BMI 35.27 kg/m2 , BMI Body  mass index is 35.27 kg/(m^2). GEN: Well nourished, well developed, in no acute distress HEENT: normal Neck: no JVD, carotid bruits, or masses Cardiac: RRR.  There is a 1/6 systolic murmur, but no rub, or gallop. There is no edema. Respiratory:  clear to auscultation bilaterally, normal work of breathing. GI: soft, nontender, nondistended, + BS MS: no deformity or atrophy Skin: warm and dry, no rash Neuro:  Strength and sensation are intact Psych: euthymic mood, full affect   EKG:  EKG is ordered today. The ekg reveals normal sinus rhythm, vertical axis, otherwise unremarkable.   Recent Labs: No results found for requested labs within last 365 days.    Lipid Panel No results found for: CHOL, TRIG, HDL, CHOLHDL, VLDL, LDLCALC, LDLDIRECT    Wt Readings from Last 3 Encounters:  01/04/15 245 lb 12.8 oz (111.494 kg)  10/11/14 245 lb (111.131 kg)  04/19/14 239 lb 12.8 oz (108.773 kg)      Other studies Reviewed: Additional studies/ records that were reviewed today include: Digital images from December 2015 were reviewed.. The findings include overall the final angiographic result was quite nice..    ASSESSMENT AND PLAN:  1. Coronary artery disease involving native coronary artery of native heart without angina pectoris A long discussion concerning acute stent thrombosis is undertaken. Risk factors including first-generation DES, premature cessation of dual antiplatelet therapy, multiple stents, were discussed. He is now one year out from stenting.  2. Hyperlipidemia Not well treated due to statin intolerance.  3. Aortic stenosis No significant murmurs noted  4. Essential hypertension Blood pressure is well controlled    Current medicines are reviewed at length with the patient today.  The patient has the following concerns regarding medicines: He is suspicious that muscle aches and joint pains are related to either Plavix or Zetia.  The following changes/actions have  been instituted:    We reviewed the coronary angiography and stent images from 2015.  He will be okay to pause dual antiplatelet therapy to allow hemorrhoidectomy. Because he has 3 stents in the LAD our preferred to resume Plavix when safe after surgery. I would feel most comfortable continuing Plavix for an additional 12 months before reverting to single antiplatelet therapy.  Pause  Zetia therapy for one month and then resume. Determine if musculoskeletal discomfort was influenced by the experiment.  Labs/ tests ordered today include:   Orders Placed This Encounter  Procedures  . EKG 12-Lead     Disposition:   FU with HS in 6 months  Signed, Sinclair Grooms, MD  01/04/2015 3:25 PM    [facility] [place], [place], [place]  [address] Phone: [phone]; Fax: [phone]

## 2015-01-12 ENCOUNTER — Other Ambulatory Visit: Payer: Self-pay | Admitting: *Deleted

## 2015-01-12 MED ORDER — CHLORTHALIDONE 25 MG PO TABS: ORAL_TABLET | ORAL | Status: DC

## 2015-02-03 DIAGNOSIS — K641 Second degree hemorrhoids: Secondary | ICD-10-CM | POA: Diagnosis not present

## 2015-03-04 DIAGNOSIS — N529 Male erectile dysfunction, unspecified: Secondary | ICD-10-CM | POA: Diagnosis not present

## 2015-03-04 DIAGNOSIS — Z1389 Encounter for screening for other disorder: Secondary | ICD-10-CM | POA: Diagnosis not present

## 2015-03-04 DIAGNOSIS — E7800 Pure hypercholesterolemia, unspecified: Secondary | ICD-10-CM | POA: Diagnosis not present

## 2015-03-04 DIAGNOSIS — I10 Essential (primary) hypertension: Secondary | ICD-10-CM | POA: Diagnosis not present

## 2015-03-04 DIAGNOSIS — N400 Enlarged prostate without lower urinary tract symptoms: Secondary | ICD-10-CM | POA: Diagnosis not present

## 2015-04-04 DIAGNOSIS — H2513 Age-related nuclear cataract, bilateral: Secondary | ICD-10-CM | POA: Diagnosis not present

## 2015-04-05 ENCOUNTER — Other Ambulatory Visit: Payer: Self-pay | Admitting: Cardiology

## 2015-04-09 ENCOUNTER — Other Ambulatory Visit: Payer: Self-pay | Admitting: Cardiology

## 2015-04-18 DIAGNOSIS — L821 Other seborrheic keratosis: Secondary | ICD-10-CM | POA: Diagnosis not present

## 2015-04-18 DIAGNOSIS — L570 Actinic keratosis: Secondary | ICD-10-CM | POA: Diagnosis not present

## 2015-04-18 DIAGNOSIS — L281 Prurigo nodularis: Secondary | ICD-10-CM | POA: Diagnosis not present

## 2015-04-18 DIAGNOSIS — C4441 Basal cell carcinoma of skin of scalp and neck: Secondary | ICD-10-CM | POA: Diagnosis not present

## 2015-04-18 DIAGNOSIS — D485 Neoplasm of uncertain behavior of skin: Secondary | ICD-10-CM | POA: Diagnosis not present

## 2015-08-16 DIAGNOSIS — H61892 Other specified disorders of left external ear: Secondary | ICD-10-CM | POA: Diagnosis not present

## 2015-08-16 DIAGNOSIS — I10 Essential (primary) hypertension: Secondary | ICD-10-CM | POA: Diagnosis not present

## 2015-08-16 DIAGNOSIS — K641 Second degree hemorrhoids: Secondary | ICD-10-CM | POA: Diagnosis not present

## 2015-08-18 ENCOUNTER — Telehealth: Payer: Self-pay | Admitting: Interventional Cardiology

## 2015-08-18 NOTE — Telephone Encounter (Signed)
Okay to pause Plavix for 7-10 days but then resume until the end of the year when Plavix can be discontinued.

## 2015-08-18 NOTE — Telephone Encounter (Signed)
Patient recently had surgery on 08/16/15 (rectal surgery, out patient procedure done in office). Patient was told he did not have to hold his Plavix for this procedure. Patient has had some bleeding. Patient states that bleeding is most prevalent during bowel movements, but oozes some regardless. Patient was told he could stop taking Plavix within the year from his last visit with Dr. Tamala Julian. Patient wants to know if he can hold his Plavix for just a few days or can he come off of it all together. Will forward to Dr. Tamala Julian.

## 2015-08-18 NOTE — Telephone Encounter (Signed)
Called patient back about Dr. Thompson Caul message. Per Dr. Tamala Julian okay to pause Plavix for 7- 10 days but then resume until the end of the year when Plavix can be discontinued. Patient verbalized understanding and thanked me for the call back.

## 2015-08-18 NOTE — Telephone Encounter (Signed)
New Message:    Pt had surgery on 08-16-15(rectal surgery).The patient have beed bleeding since Wednesday, pt is on Plavix. He wants to know if he needs to stop his Plavix? Pt is scheduled to see Dr Tamala Julian on this Tuesday(08-23-15).Please call him asap to advise.

## 2015-08-22 NOTE — Progress Notes (Signed)
Cardiology Office Note    Date:  08/23/2015   ID:  Jason Blackwell, DOB October 30, 1945, MRN [id number]  PCP:  Jason Shelling, MD  Cardiologist: Jason Grooms, MD   Chief Complaint  Patient presents with  . Coronary Artery Disease    History of Present Illness:  Jason Blackwell is a 70 y.o. male follow-up of coronary artery disease, multiple LAD stents including the ostium, hypertension, and diastolic left ventricular heart failure.  There've been no cardiovascular symptoms or complaints in the past year and a half. The patient is on aspirin and Plavix. Recently had hemorrhoidal bleeding following a banding procedure. Plavix is been on hold now for proximally 5 days. Bleeding has stopped. Off Plavix has been no chest discomfort or complaint. There is some exertional dyspnea. He doesn't exercise regularly. He has not needed to use nitroglycerin.  Past Medical History:  Diagnosis Date  . BPH (benign prostatic hypertrophy)   . Colon adenoma   . Coronary artery disease   . Ejection fraction   . Erectile dysfunction   . Fecal incontinence   . Heart murmur [id number]   LVH, diastolic dysfunction, aortic sclerosis  . Hemorrhoids   . Hyperlipidemia   . Hypertension   . Insomnia   . Kidney stones    "passed them"    Past Surgical History:  Procedure Laterality Date  . CORONARY ANGIOPLASTY WITH STENT PLACEMENT  12/31/2013   "3"  . LEFT HEART CATHETERIZATION WITH CORONARY ANGIOGRAM N/A 12/31/2013   Procedure: LEFT HEART CATHETERIZATION WITH CORONARY ANGIOGRAM;  Surgeon: Jason Grooms, MD;  Location: [facility] CATH LAB;  Service: Cardiovascular;  Laterality: N/A; Ostial and mid LAD stenting  . PROSTATE BIOPSY  ~ 1991  . SKIN LESION EXCISION Right 2013   precancerous lesion from arm  . TONSILLECTOMY      Current Medications: Outpatient Medications Prior to Visit  Medication Sig Dispense Refill  . AMLODIPINE BESYLATE PO Take 10 mg by mouth daily.    [name] aspirin EC 81 MG tablet  Take 81 mg by mouth daily.    . chlorthalidone (HYGROTON) 25 MG tablet TAKE 1 TABLET (25 MG TOTAL) BY MOUTH DAILY. 90 tablet 3  . clopidogrel (PLAVIX) 75 MG tablet TAKE 1 TABLET (75 MG TOTAL) BY MOUTH DAILY. 90 tablet 1  . ezetimibe (ZETIA) 10 MG tablet Take 10 mg by mouth daily.    [name] losartan (COZAAR) 100 MG tablet Take 100 mg by mouth daily.    . Meth-Hyo-M Bl-Na Phos-Ph Sal (URIBEL) 118 MG CAPS Take 1 capsule by mouth daily as needed (burning when urinating).    . sildenafil (REVATIO) 20 MG tablet Take 20 mg by mouth every 12 (twelve) hours as needed. FOR ED    . simethicone (MYLICON) 80 MG chewable tablet Chew 80 mg by mouth as directed. Take 5 tablets a day.  1 in the morning 2 at lunch and 2 at supper    . zolpidem (AMBIEN) 10 MG tablet Take 10 mg by mouth at bedtime as needed for sleep. Take one half to one tablet as needed    . hydrocortisone (ANUSOL-HC) 2.5 % rectal cream Place 1 application rectally 2 (two) times daily.    . Lactobacillus-Inulin ([place] PO) Take by mouth daily.     No facility-administered medications prior to visit.      Allergies:   Simvastatin; Succinylcholine chloride; and Sulfamethoxazole   Social History   Social History  . Marital status: Married  Spouse name: N/A  . Number of children: N/A  . Years of education: N/A   Social History Main Topics  . Smoking status: Never Smoker  . Smokeless tobacco: Never Used  . Alcohol use 16.8 oz/week    28 Glasses of wine per week  . Drug use: No  . Sexual activity: Yes   Other Topics Concern  . None   Social History Narrative  . None     Family History:  The patient's family history includes Cancer - Colon in his maternal uncle; Diabetes in his sister; Heart attack in his father; Heart disease in his mother.   ROS:   Please see the history of present illness.    Back pain, left hip pain, rectal bleeding due to hemorrhoids, muscle discomfort each morning. Since being off Plavix  for muscle discomfort has resolved. All other systems reviewed and are negative.   PHYSICAL EXAM:   VS:  BP 134/76   Pulse 63   Ht 5\' 11"  (1.803 m)   Wt 246 lb (111.6 kg)   BMI 34.31 kg/m    GEN: Well nourished, well developed, in no acute distress  HEENT: normal  Neck: no JVD, carotid bruits, or masses Cardiac: RRR; no murmurs, rubs, or gallops,no edema  Respiratory:  clear to auscultation bilaterally, normal work of breathing GI: soft, nontender, nondistended, + BS MS: no deformity or atrophy  Skin: warm and dry, no rash Neuro:  Alert and Oriented x 3, Strength and sensation are intact Psych: euthymic mood, full affect  Wt Readings from Last 3 Encounters:  08/23/15 246 lb (111.6 kg)  01/04/15 245 lb 12.8 oz (111.5 kg)  10/11/14 245 lb (111.1 kg)      Studies/Labs Reviewed:   EKG:  EKG  Is not performed at today's visit  Recent Labs: No results found for requested labs within last 8760 hours.   Lipid Panel No results found for: CHOL, TRIG, HDL, CHOLHDL, VLDL, LDLCALC, LDLDIRECT  Additional studies/ records that were reviewed today include:  Echocardiogram 2015 Study Conclusions  - Left ventricle: The cavity size was normal. Systolic function was normal. The estimated ejection fraction was in the range of 60% to 65%. Wall motion was normal; there were no regional wall motion abnormalities. Doppler parameters are consistent with abnormal left ventricular relaxation (grade 1 diastolic dysfunction). Doppler parameters are consistent with high ventricular filling pressure. - Aortic valve: Cusp separation was reduced. There was mild to moderate stenosis. Peak velocity (S): 292 cm/s. Mean gradient (S): 21 mm Hg. - Mitral valve: Moderately calcified annulus. Mildly thickened leaflets . - Left atrium: The atrium was mildly dilated.   ASSESSMENT:    1. Coronary artery disease involving native coronary artery of native heart without angina pectoris    2. Aortic stenosis   3. Essential hypertension   4. Hyperlipidemia      PLAN:  In order of problems listed above:  1. Stable with reference to symptoms. The patient has overlapping mid LAD stents and an ostial LAD bare-metal stent postdilated to 4.0 mm in diameter. He is now nearly 2 years out from stent implantation. He is off Plavix transiently due to rectal bleeding related to hemorrhoidal band procedure. I've instructed him to resume Plavix along with aspirin once the hemorrhoid problem is resolved. He should continue dual antiplatelet therapy until 01/16/2016. 2. Mild aortic stenosis based on echocardiography. This will be followed longitudinally. 3. Low-salt diet and weight loss or recommended 4. Target of 70. Lipids followed by primary  care. Current therapy includes Zetia but no statin. I recommended a vegetable based diet.    Medication Adjustments/Labs and Tests Ordered: Current medicines are reviewed at length with the patient today.  Concerns regarding medicines are outlined above.  Medication changes, Labs and Tests ordered today are listed in the Patient Instructions below. Patient Instructions  Medication Instructions:  Continue to HOLD Plavix until your Hemorrhoidal treatment has concluding the resume taking daily. DISCONTINUE Plavix at the end of December 2017  Labwork: None ordered  Testing/Procedures: None ordered  Follow-Up: Your physician wants you to follow-up in: 1 year with Dr.Jac Romulus You will receive a reminder letter in the mail two months in advance. If you don't receive a letter, please call our office to schedule the follow-up appointment.   Any Other Special Instructions Will Be Listed Below (If Applicable).     If you need a refill on your cardiac medications before your next appointment, please call your pharmacy.      Signed, Jason Grooms, MD  08/23/2015 12:53 PM    [facility] [place], [place], [place]   [address] Phone: [phone]; Fax: [phone]

## 2015-08-23 ENCOUNTER — Ambulatory Visit (INDEPENDENT_AMBULATORY_CARE_PROVIDER_SITE_OTHER): Payer: PPO | Admitting: Interventional Cardiology

## 2015-08-23 ENCOUNTER — Encounter: Payer: Self-pay | Admitting: Interventional Cardiology

## 2015-08-23 VITALS — BP 134/76 | HR 63 | Ht 71.0 in | Wt 246.0 lb

## 2015-08-23 DIAGNOSIS — E785 Hyperlipidemia, unspecified: Secondary | ICD-10-CM | POA: Diagnosis not present

## 2015-08-23 DIAGNOSIS — I2510 Atherosclerotic heart disease of native coronary artery without angina pectoris: Secondary | ICD-10-CM

## 2015-08-23 DIAGNOSIS — H61892 Other specified disorders of left external ear: Secondary | ICD-10-CM | POA: Diagnosis not present

## 2015-08-23 DIAGNOSIS — B078 Other viral warts: Secondary | ICD-10-CM | POA: Diagnosis not present

## 2015-08-23 DIAGNOSIS — R220 Localized swelling, mass and lump, head: Secondary | ICD-10-CM | POA: Diagnosis not present

## 2015-08-23 DIAGNOSIS — H6192 Disorder of left external ear, unspecified: Secondary | ICD-10-CM | POA: Insufficient documentation

## 2015-08-23 DIAGNOSIS — I10 Essential (primary) hypertension: Secondary | ICD-10-CM

## 2015-08-23 DIAGNOSIS — H61899 Other specified disorders of external ear, unspecified ear: Secondary | ICD-10-CM | POA: Diagnosis not present

## 2015-08-23 DIAGNOSIS — I350 Nonrheumatic aortic (valve) stenosis: Secondary | ICD-10-CM | POA: Diagnosis not present

## 2015-08-23 NOTE — Patient Instructions (Signed)
Medication Instructions:  Continue to HOLD Plavix until your Hemorrhoidal treatment has concluding the resume taking daily. DISCONTINUE Plavix at the end of December 2017  Labwork: None ordered  Testing/Procedures: None ordered  Follow-Up: Your physician wants you to follow-up in: 1 year with Dr.Smith You will receive a reminder letter in the mail two months in advance. If you don't receive a letter, please call our office to schedule the follow-up appointment.   Any Other Special Instructions Will Be Listed Below (If Applicable).     If you need a refill on your cardiac medications before your next appointment, please call your pharmacy.

## 2015-08-24 DIAGNOSIS — B079 Viral wart, unspecified: Secondary | ICD-10-CM | POA: Diagnosis not present

## 2015-08-24 DIAGNOSIS — H9390 Unspecified disorder of ear, unspecified ear: Secondary | ICD-10-CM | POA: Diagnosis not present

## 2015-09-13 DIAGNOSIS — H6192 Disorder of left external ear, unspecified: Secondary | ICD-10-CM | POA: Diagnosis not present

## 2015-09-20 DIAGNOSIS — R972 Elevated prostate specific antigen [PSA]: Secondary | ICD-10-CM | POA: Diagnosis not present

## 2015-09-20 DIAGNOSIS — K641 Second degree hemorrhoids: Secondary | ICD-10-CM | POA: Diagnosis not present

## 2015-09-26 DIAGNOSIS — N5201 Erectile dysfunction due to arterial insufficiency: Secondary | ICD-10-CM | POA: Diagnosis not present

## 2015-09-26 DIAGNOSIS — B9789 Other viral agents as the cause of diseases classified elsewhere: Secondary | ICD-10-CM | POA: Diagnosis not present

## 2015-09-26 DIAGNOSIS — N401 Enlarged prostate with lower urinary tract symptoms: Secondary | ICD-10-CM | POA: Diagnosis not present

## 2015-09-26 DIAGNOSIS — R3915 Urgency of urination: Secondary | ICD-10-CM | POA: Diagnosis not present

## 2015-09-26 DIAGNOSIS — R972 Elevated prostate specific antigen [PSA]: Secondary | ICD-10-CM | POA: Diagnosis not present

## 2015-09-26 DIAGNOSIS — J069 Acute upper respiratory infection, unspecified: Secondary | ICD-10-CM | POA: Diagnosis not present

## 2015-10-03 DIAGNOSIS — M545 Low back pain: Secondary | ICD-10-CM | POA: Diagnosis not present

## 2015-10-03 DIAGNOSIS — R509 Fever, unspecified: Secondary | ICD-10-CM | POA: Diagnosis not present

## 2015-10-03 DIAGNOSIS — R0989 Other specified symptoms and signs involving the circulatory and respiratory systems: Secondary | ICD-10-CM | POA: Diagnosis not present

## 2015-10-03 DIAGNOSIS — R350 Frequency of micturition: Secondary | ICD-10-CM | POA: Diagnosis not present

## 2015-10-03 DIAGNOSIS — R300 Dysuria: Secondary | ICD-10-CM | POA: Diagnosis not present

## 2015-10-05 ENCOUNTER — Other Ambulatory Visit: Payer: Self-pay | Admitting: Interventional Cardiology

## 2015-10-13 DIAGNOSIS — Z23 Encounter for immunization: Secondary | ICD-10-CM | POA: Diagnosis not present

## 2015-10-19 DIAGNOSIS — N410 Acute prostatitis: Secondary | ICD-10-CM | POA: Diagnosis not present

## 2015-11-17 DIAGNOSIS — R05 Cough: Secondary | ICD-10-CM | POA: Diagnosis not present

## 2016-01-10 DIAGNOSIS — J209 Acute bronchitis, unspecified: Secondary | ICD-10-CM | POA: Diagnosis not present

## 2016-01-10 DIAGNOSIS — R05 Cough: Secondary | ICD-10-CM | POA: Diagnosis not present

## 2016-01-10 DIAGNOSIS — N419 Inflammatory disease of prostate, unspecified: Secondary | ICD-10-CM | POA: Diagnosis not present

## 2016-01-24 DIAGNOSIS — H6192 Disorder of left external ear, unspecified: Secondary | ICD-10-CM | POA: Diagnosis not present

## 2016-03-05 DIAGNOSIS — E7800 Pure hypercholesterolemia, unspecified: Secondary | ICD-10-CM | POA: Diagnosis not present

## 2016-03-05 DIAGNOSIS — N400 Enlarged prostate without lower urinary tract symptoms: Secondary | ICD-10-CM | POA: Diagnosis not present

## 2016-03-05 DIAGNOSIS — I2510 Atherosclerotic heart disease of native coronary artery without angina pectoris: Secondary | ICD-10-CM | POA: Diagnosis not present

## 2016-03-05 DIAGNOSIS — Z0000 Encounter for general adult medical examination without abnormal findings: Secondary | ICD-10-CM | POA: Diagnosis not present

## 2016-03-05 DIAGNOSIS — Z1389 Encounter for screening for other disorder: Secondary | ICD-10-CM | POA: Diagnosis not present

## 2016-03-05 DIAGNOSIS — I10 Essential (primary) hypertension: Secondary | ICD-10-CM | POA: Diagnosis not present

## 2016-03-07 ENCOUNTER — Telehealth: Payer: Self-pay | Admitting: Interventional Cardiology

## 2016-04-09 DIAGNOSIS — D485 Neoplasm of uncertain behavior of skin: Secondary | ICD-10-CM | POA: Diagnosis not present

## 2016-04-09 DIAGNOSIS — D0439 Carcinoma in situ of skin of other parts of face: Secondary | ICD-10-CM | POA: Diagnosis not present

## 2016-04-09 DIAGNOSIS — C4441 Basal cell carcinoma of skin of scalp and neck: Secondary | ICD-10-CM | POA: Diagnosis not present

## 2016-05-02 ENCOUNTER — Ambulatory Visit (INDEPENDENT_AMBULATORY_CARE_PROVIDER_SITE_OTHER): Payer: PPO | Admitting: Interventional Cardiology

## 2016-05-02 VITALS — BP 146/78 | HR 58 | Ht 70.0 in | Wt 255.4 lb

## 2016-05-02 DIAGNOSIS — I350 Nonrheumatic aortic (valve) stenosis: Secondary | ICD-10-CM | POA: Diagnosis not present

## 2016-05-02 DIAGNOSIS — E7800 Pure hypercholesterolemia, unspecified: Secondary | ICD-10-CM

## 2016-05-02 DIAGNOSIS — I2510 Atherosclerotic heart disease of native coronary artery without angina pectoris: Secondary | ICD-10-CM | POA: Diagnosis not present

## 2016-05-02 DIAGNOSIS — R0789 Other chest pain: Secondary | ICD-10-CM | POA: Diagnosis not present

## 2016-05-02 DIAGNOSIS — I10 Essential (primary) hypertension: Secondary | ICD-10-CM

## 2016-05-02 NOTE — Patient Instructions (Signed)
Medication Instructions:  None  Labwork: None  Testing/Procedures: Your physician has requested that you have en exercise stress myoview. For further information please visit [email or web address]. Please follow instruction sheet, as given.  Your physician has requested that you have an echocardiogram. Echocardiography is a painless test that uses sound waves to create images of your heart. It provides your doctor with information about the size and shape of your heart and how well your heart's chambers and valves are working. This procedure takes approximately one hour. There are no restrictions for this procedure.    Follow-Up:  Your physician recommends that you schedule a follow-up appointment in: Lipid Clinic due to intolerances to statin medications.   Your physician wants you to follow-up in: 1 year with Dr. Tamala Julian. You will receive a reminder letter in the mail two months in advance. If you don't receive a letter, please call our office to schedule the follow-up appointment.   Any Other Special Instructions Will Be Listed Below (If Applicable).    If you need a refill on your cardiac medications before your next appointment, please call your pharmacy.

## 2016-05-02 NOTE — Progress Notes (Addendum)
Cardiology Office Note    Date:  05/02/2016   ID:  Jason Blackwell, DOB February 04, 1945, MRN [id number]  PCP:  Irven Shelling, MD  Cardiologist: Sinclair Grooms, MD   Chief Complaint  Patient presents with  . Coronary Artery Disease    History of Present Illness:  Jason Blackwell is a 71 y.o. male with history of CAD with ostial to proximal LAD DES 3 in 2015, aortic stenosis, hypertension, hyperlipidemia, and diastolic left ventricular dysfunction.  He is doing well. He called for an early appointment because primary care was concerned about elevated lipids that cannot be treated with statins due to intolerance. He is on Zetia. His most recent LDL was 130 with a total cholesterol of 230 performed by his primary physician in February 2018.  He has intermittent lateral left lower chest discomfort. The discomfort is nonexertional. He denies orthopnea and PND. There is no peripheral edema.  Past Medical History:  Diagnosis Date  . BPH (benign prostatic hypertrophy)   . Colon adenoma   . Coronary artery disease   . Ejection fraction   . Erectile dysfunction   . Fecal incontinence   . Heart murmur 4132   LVH, diastolic dysfunction, aortic sclerosis  . Hemorrhoids   . Hyperlipidemia   . Hypertension   . Insomnia   . Kidney stones    "passed them"    Past Surgical History:  Procedure Laterality Date  . CORONARY ANGIOPLASTY WITH STENT PLACEMENT  12/31/2013   "3"  . LEFT HEART CATHETERIZATION WITH CORONARY ANGIOGRAM N/A 12/31/2013   Procedure: LEFT HEART CATHETERIZATION WITH CORONARY ANGIOGRAM;  Surgeon: Sinclair Grooms, MD;  Location: [facility] CATH LAB;  Service: Cardiovascular;  Laterality: N/A; Ostial and mid LAD stenting  . PROSTATE BIOPSY  ~ 1991  . SKIN LESION EXCISION Right 2013   precancerous lesion from arm  . TONSILLECTOMY      Current Medications: Outpatient Medications Prior to Visit  Medication Sig Dispense Refill  . AMLODIPINE BESYLATE PO Take 10 mg by  mouth daily.    [name] aspirin EC 81 MG tablet Take 81 mg by mouth daily.    . chlorthalidone (HYGROTON) 25 MG tablet TAKE 1 TABLET (25 MG TOTAL) BY MOUTH DAILY. 90 tablet 3  . ezetimibe (ZETIA) 10 MG tablet Take 10 mg by mouth daily.    [name] losartan (COZAAR) 100 MG tablet Take 100 mg by mouth daily.    . Meth-Hyo-M Bl-Na Phos-Ph Sal (URIBEL) 118 MG CAPS Take 1 capsule by mouth daily as needed (burning when urinating).    . sildenafil (REVATIO) 20 MG tablet Take 20 mg by mouth every 12 (twelve) hours as needed. FOR ED    . simethicone (MYLICON) 80 MG chewable tablet Chew 80 mg by mouth as directed. Take 5 tablets a day.  1 in the morning 2 at lunch and 2 at supper    . zolpidem (AMBIEN) 10 MG tablet Take 10 mg by mouth at bedtime as needed for sleep. Take one half to one tablet as needed    . clopidogrel (PLAVIX) 75 MG tablet Take 1 tablet (75 mg total) by mouth daily. Take until the END of December per Dr. Tamala Julian 90 tablet 0   No facility-administered medications prior to visit.      Allergies:   Simvastatin; Succinylcholine chloride; and Sulfamethoxazole   Social History   Social History  . Marital status: Married    Spouse name: N/A  . Number of children: N/A  .  Years of education: N/A   Social History Main Topics  . Smoking status: Never Smoker  . Smokeless tobacco: Never Used  . Alcohol use 16.8 oz/week    28 Glasses of wine per week  . Drug use: No  . Sexual activity: Yes   Other Topics Concern  . Not on file   Social History Narrative  . No narrative on file     Family History:  The patient's family history includes Cancer - Colon in his maternal uncle; Diabetes in his sister; Heart attack in his father; Heart disease in his mother.   ROS:   Please see the history of present illness.    None  All other systems reviewed and are negative.   PHYSICAL EXAM:   VS:  BP (!) 146/78 (BP Location: Left Arm)   Pulse (!) 58   Ht 5\' 10"  (1.778 m)   Wt 255 lb 6.4 oz (115.8 kg)    BMI 36.65 kg/m    GEN: Well nourished, well developed, in no acute distress  HEENT: normal  Neck: no JVD, carotid bruits, or masses Cardiac: ZOX;0/9 systolic murmur;  rubs, or gallops,no edema.  Respiratory:  clear to auscultation bilaterally, normal work of breathing GI: soft, nontender, nondistended, + BS MS: no deformity or atrophy  Skin: warm and dry, no rash Neuro:  Alert and Oriented x 3, Strength and sensation are intact Psych: euthymic mood, full affect  Wt Readings from Last 3 Encounters:  05/02/16 255 lb 6.4 oz (115.8 kg)  08/23/15 246 lb (111.6 kg)  01/04/15 245 lb 12.8 oz (111.5 kg)      Studies/Labs Reviewed:   EKG:  EKG  Sinus bradycardia, heart rate 58 bpm, rightward axis, and were compared to prior no significant change other than slow heart rate.  Recent Labs: No results found for requested labs within last 8760 hours.   Lipid Panel No results found for: CHOL, TRIG, HDL, CHOLHDL, VLDL, LDLCALC, LDLDIRECT  Additional studies/ records that were reviewed today include:   Echocardiogram 2015: Study Conclusions  - Left ventricle: The cavity size was normal. Systolic function was normal. The estimated ejection fraction was in the range of 60% to 65%. Wall motion was normal; there were no regional wall motion abnormalities. Doppler parameters are consistent with abnormal left ventricular relaxation (grade 1 diastolic dysfunction). Doppler parameters are consistent with high ventricular filling pressure. - Aortic valve: Cusp separation was reduced. There was mild to moderate stenosis. Peak velocity (S): 292 cm/s. Mean gradient (S): 21 mm Hg. - Mitral valve: Moderately calcified annulus. Mildly thickened leaflets . - Left atrium: The atrium was mildly dilated.  Cardiac catheterization thousand 2015: IMPRESSIONS:  1. High-risk myocardial perfusion study with anterior ischemia due to ostial LAD of 95% 2. Successful stenting of the ostial LAD  from 95% to 0% (4.0 mm post dilatation diameter). 3. 80% mid LAD stenosis reduced to 30% with TIMI grade 3 flow after overlapping stenting and high pressure balloon inflations. Indentation on the proximal margin of the mis LAD stent could not be relieved. 4. Widely patent circumflex and RCA 5. Normal left ventricular systolic function  ASSESSMENT:    1. Nonrheumatic aortic valve stenosis   2. Coronary artery disease involving native coronary artery of native heart without angina pectoris   3. Essential hypertension   4. Pure hypercholesterolemia      PLAN:  In order of problems listed above:  1. Aortic stenosis needs to be reevaluated with a 2-D Doppler echocardiogram 2. Rare  episodes of atypical chest discomfort of uncertain etiology. Doubt CAD but with stent location in the ostial LAD, myocardial perfusion imaging will be performed to rule out ischemia as this is a very critical territory. 3. Blood pressures under excellent control. 4. Lipids are not well controlled. Will refer to the lipid clinic. He is intolerant of statins with severe myopathy. Refer for consideration of PCI SK9    Medication Adjustments/Labs and Tests Ordered: Current medicines are reviewed at length with the patient today.  Concerns regarding medicines are outlined above.  Medication changes, Labs and Tests ordered today are listed in the Patient Instructions below. There are no Patient Instructions on file for this visit.   Signed, Sinclair Grooms, MD  05/02/2016 9:35 AM    [facility] [place], [place], [place]  [address] Phone: [phone]; Fax: [phone]

## 2016-05-09 ENCOUNTER — Ambulatory Visit (INDEPENDENT_AMBULATORY_CARE_PROVIDER_SITE_OTHER): Payer: PPO | Admitting: Pharmacist

## 2016-05-09 DIAGNOSIS — E7800 Pure hypercholesterolemia, unspecified: Secondary | ICD-10-CM | POA: Diagnosis not present

## 2016-05-09 NOTE — Progress Notes (Signed)
Patient ID: Jason Blackwell                 DOB: 1945-11-28                    MRN: [id number]     HPI: Jason Blackwell is a 71 y.o. male patient of Dr. Tamala Blackwell that presents today for lipid evaluation.  PMH includes CAD with ostial to proximal LAD DES 3 (Dec 2015), aortic stenosis, hypertension, hyperlipidemia, and diastolic left ventricular dysfunction. He recently called for an early appt with Dr. Tamala Blackwell at the recommendation of his PCP due to elevated lipids.   He presents today and states that he has had a long history with statin intolerance. He reports that back in 2001 he was admitted for a prostate biopsy for which he was put under anaesthesia. He states that he had a bad reaction and the doctor at [facility] reported reaction was due to succinalcolate and Zocor. It was recommended he stay off statins based on this reaction. He reports over time and given cardiac history it was felt safe to reachallenge him with Lipitor and Crestor low dose. Both of these caused severe muscle aching so bad that once he was unable to get out of bed. These muscle symptoms resolved with discontinuation of therapy.   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 He has been working on eating more vegetables.   Exercise: Plans to restart exercise program.   Family History: The patient's family history includes Cancer - Colon in his maternal uncle; Diabetes in his sister; Heart attack in his father; Heart disease in his mother.   Social History: Denies tobacco and alcohol.   Labs: 03/05/16:  TC 229,  TG 219,  HDL 55,  LDL 130,  Non-HDL 174 - on zetia 10mg  daily   Past Medical History:  Diagnosis Date  . BPH (benign prostatic hypertrophy)   . Colon adenoma   . Coronary artery  disease   . Ejection fraction   . Erectile dysfunction   . Fecal incontinence   . Heart murmur 5397   LVH, diastolic dysfunction, aortic sclerosis  . Hemorrhoids   . Hyperlipidemia   . Hypertension   . Insomnia   . Kidney stones    "passed them"    Current Outpatient Prescriptions on File Prior to Visit  Medication Sig Dispense Refill  . AMLODIPINE BESYLATE PO Take 10 mg by mouth daily.    [name] aspirin EC 81 MG tablet Take 81 mg by mouth daily.    . chlorthalidone (HYGROTON) 25 MG tablet TAKE 1 TABLET (25 MG TOTAL) BY MOUTH DAILY. 90 tablet 3  . ezetimibe (ZETIA) 10 MG tablet Take 10 mg by mouth daily.    [name] losartan (COZAAR) 100 MG tablet Take 100 mg by mouth daily.    . Meth-Hyo-M Bl-Na Phos-Ph Sal (URIBEL) 118 MG CAPS Take 1 capsule by mouth daily as needed (burning when urinating).    . sildenafil (REVATIO) 20 MG tablet Take 20 mg by mouth every 12 (twelve) hours as needed. FOR ED    . simethicone (MYLICON) 80 MG chewable tablet Chew 80 mg by mouth as directed. Take 5 tablets a day.  1 in the morning 2 at lunch and  2 at supper    . tamsulosin (FLOMAX) 0.4 MG CAPS capsule Take 0.4 mg by mouth daily.    [name] zolpidem (AMBIEN) 10 MG tablet Take 10 mg by mouth at bedtime as needed for sleep. Take one half to one tablet as needed     No current facility-administered medications on file prior to visit.     Allergies  Allergen Reactions  . Simvastatin     REACTION: unspecified  . Succinylcholine Chloride     Respiratory problems  . Sulfamethoxazole     REACTION: unspecified    Assessment/Plan: Hyperlipidemia: LDL and non-HDL above goal. Pt is statin intolerant. Only option for treatment of LDL would be PCSK9i. Pt aware of injectable therapy and is comfortable with injections. His only concern is cost. Will process through insurance to determine copay and file for patient assistance if needed.  Pt prefers Repatha 140mg  Q14D.   Thank you,  Jason Blackwell, [facility]  05/09/2016 7:14 AM

## 2016-05-09 NOTE — Patient Instructions (Signed)
We will send for coverage of Repatha through insurance. We will call you when we have approval.   Tana Coast, Pharmacist - [phone]   Cholesterol Cholesterol is a fat. Your body needs a small amount of cholesterol. Cholesterol (plaque) may build up in your blood vessels (arteries). That makes you more likely to have a heart attack or stroke. You cannot feel your cholesterol level. Having a blood test is the only way to find out if your level is high. Keep your test results. Work with your doctor to keep your cholesterol at a good level. What do the results mean?  Total cholesterol is how much cholesterol is in your blood.  LDL is bad cholesterol. This is the type that can build up. Try to have low LDL.  HDL is good cholesterol. It cleans your blood vessels and carries LDL away. Try to have high HDL.  Triglycerides are fat that the body can store or burn for energy. What are good levels of cholesterol?  Total cholesterol below 200.  LDL below 100 is good for people who have health risks. LDL below 70 is good for people who have very high risks.  HDL above 40 is good. It is best to have HDL of 60 or higher.  Triglycerides below 150. How can I lower my cholesterol? Diet  Follow your diet program as told by your doctor.  Choose fish, white meat chicken, or [place] that is roasted or baked. Try not to eat red meat, fried foods, sausage, or lunch meats.  Eat lots of fresh fruits and vegetables.  Choose whole grains, beans, pasta, potatoes, and cereals.  Choose olive oil, corn oil, or canola oil. Only use small amounts.  Try not to eat butter, mayonnaise, shortening, or palm kernel oils.  Try not to eat foods with trans fats.  Choose low-fat or nonfat dairy foods.  Drink skim or nonfat milk.  Eat low-fat or nonfat yogurt and cheeses.  Try not to drink whole milk or cream.  Try not to eat ice cream, egg yolks, or full-fat cheeses.  Healthy desserts include angel food  cake, ginger snaps, animal crackers, hard candy, popsicles, and low-fat or nonfat frozen yogurt. Try not to eat pastries, cakes, pies, and cookies. Exercise  Follow your exercise program as told by your doctor.  Be more active. Try gardening, walking, and taking the stairs.  Ask your doctor about ways that you can be more active. Medicine  Take over-the-counter and prescription medicines only as told by your doctor. This information is not intended to replace advice given to you by your health care provider. Make sure you discuss any questions you have with your health care provider. Document Released: 03/30/2008 Document Revised: 08/03/2015 Document Reviewed: 07/14/2015 Elsevier Interactive Patient Education  2017 [employer].

## 2016-05-10 DIAGNOSIS — Z85828 Personal history of other malignant neoplasm of skin: Secondary | ICD-10-CM | POA: Diagnosis not present

## 2016-05-10 DIAGNOSIS — L570 Actinic keratosis: Secondary | ICD-10-CM | POA: Diagnosis not present

## 2016-05-10 DIAGNOSIS — D2261 Melanocytic nevi of right upper limb, including shoulder: Secondary | ICD-10-CM | POA: Diagnosis not present

## 2016-05-10 DIAGNOSIS — D225 Melanocytic nevi of trunk: Secondary | ICD-10-CM | POA: Diagnosis not present

## 2016-05-10 DIAGNOSIS — D0439 Carcinoma in situ of skin of other parts of face: Secondary | ICD-10-CM | POA: Diagnosis not present

## 2016-05-10 DIAGNOSIS — X32XXXA Exposure to sunlight, initial encounter: Secondary | ICD-10-CM | POA: Diagnosis not present

## 2016-05-10 DIAGNOSIS — C4441 Basal cell carcinoma of skin of scalp and neck: Secondary | ICD-10-CM | POA: Diagnosis not present

## 2016-05-10 DIAGNOSIS — D485 Neoplasm of uncertain behavior of skin: Secondary | ICD-10-CM | POA: Diagnosis not present

## 2016-05-14 ENCOUNTER — Telehealth: Payer: Self-pay | Admitting: Pharmacist

## 2016-05-14 MED ORDER — EVOLOCUMAB 140 MG/ML ~~LOC~~ SOAJ: 140.0000 mg | SUBCUTANEOUS | 11 refills | Status: DC

## 2016-05-14 NOTE — Telephone Encounter (Signed)
Pt approved for Repatha by insurance through 07/06/2016. RX sent to specialty pharmacy. Will await copay.

## 2016-05-15 ENCOUNTER — Telehealth: Payer: Self-pay | Admitting: Interventional Cardiology

## 2016-05-15 NOTE — Telephone Encounter (Signed)
New message    Cyril Mourning from [facility] is calling to get pt allergy information to finish processing repatha prescription.

## 2016-05-15 NOTE — Telephone Encounter (Signed)
Returned call to [facility] and provided them with pt allergies. No further questions.

## 2016-05-16 NOTE — Telephone Encounter (Signed)
Spoke to [place] at [facility] and copay will be $372.72 for 1 month supply.   Spoke to patient and made aware of above. This copay will be cost-prohibitive for him. Will send his information to [employer] for coverage of Repatha.

## 2016-05-24 ENCOUNTER — Telehealth: Payer: Self-pay | Admitting: Interventional Cardiology

## 2016-05-24 NOTE — Telephone Encounter (Signed)
New message       Talk to the pharmacist regarding the repatha people.  Repatha has been approved.  Patient want more info on when to start taking medication.

## 2016-05-24 NOTE — Telephone Encounter (Signed)
Returned call to pt. Pt received call from the [facility] stating that his Repatha will be delivered tomorrow. He would like to come in on Monday 5/14 for injection training at 11am.

## 2016-05-28 ENCOUNTER — Telehealth: Payer: Self-pay | Admitting: Pharmacist

## 2016-05-28 DIAGNOSIS — E7800 Pure hypercholesterolemia, unspecified: Secondary | ICD-10-CM

## 2016-05-28 NOTE — Telephone Encounter (Signed)
Pt in office to do first injection of Repatha - pt demonstrated appropriate injection technique and labs scheduled.

## 2016-05-30 ENCOUNTER — Telehealth (HOSPITAL_COMMUNITY): Payer: Self-pay | Admitting: *Deleted

## 2016-05-30 NOTE — Telephone Encounter (Signed)
Patient given detailed instructions per Myocardial Perfusion Study Information Sheet for the test on 06/05/16. Patient notified to arrive 15 minutes early and that it is imperative to arrive on time for appointment to keep from having the test rescheduled.  If you need to cancel or reschedule your appointment, please call the office within 24 hours of your appointment. . Patient verbalized understanding. Jason Blackwell Jacqueline    

## 2016-06-05 ENCOUNTER — Ambulatory Visit (HOSPITAL_COMMUNITY): Payer: PPO | Attending: Cardiovascular Disease

## 2016-06-05 ENCOUNTER — Ambulatory Visit (HOSPITAL_BASED_OUTPATIENT_CLINIC_OR_DEPARTMENT_OTHER): Payer: PPO

## 2016-06-05 ENCOUNTER — Other Ambulatory Visit: Payer: Self-pay

## 2016-06-05 DIAGNOSIS — I2510 Atherosclerotic heart disease of native coronary artery without angina pectoris: Secondary | ICD-10-CM

## 2016-06-05 DIAGNOSIS — R0789 Other chest pain: Secondary | ICD-10-CM | POA: Diagnosis not present

## 2016-06-05 DIAGNOSIS — I350 Nonrheumatic aortic (valve) stenosis: Secondary | ICD-10-CM | POA: Diagnosis not present

## 2016-06-05 LAB — MYOCARDIAL PERFUSION IMAGING
CHL CUP NUCLEAR SDS: 1
CSEPPHR: 123 {beats}/min
LV dias vol: 113 mL (ref 62–150)
LV sys vol: 43 mL
RATE: 0.32
Rest HR: 65 {beats}/min
SRS: 1
SSS: 2
TID: 0.92

## 2016-06-05 MED ORDER — TECHNETIUM TC 99M TETROFOSMIN IV KIT
32.5000 | PACK | Freq: Once | INTRAVENOUS | Status: AC | PRN
  Administered 2016-06-05: 32.5 via INTRAVENOUS
  Filled 2016-06-05: qty 33

## 2016-06-05 MED ORDER — REGADENOSON 0.4 MG/5ML IV SOLN
0.4000 mg | Freq: Once | INTRAVENOUS | Status: AC
  Administered 2016-06-05: 0.4 mg via INTRAVENOUS

## 2016-06-05 MED ORDER — PERFLUTREN LIPID MICROSPHERE
1.0000 mL | INTRAVENOUS | Status: AC | PRN
  Administered 2016-06-05: 2 mL via INTRAVENOUS

## 2016-06-05 MED ORDER — TECHNETIUM TC 99M TETROFOSMIN IV KIT
10.7000 | PACK | Freq: Once | INTRAVENOUS | Status: AC | PRN
  Administered 2016-06-05: 10.7 via INTRAVENOUS
  Filled 2016-06-05: qty 11

## 2016-06-27 DIAGNOSIS — L905 Scar conditions and fibrosis of skin: Secondary | ICD-10-CM | POA: Diagnosis not present

## 2016-06-27 DIAGNOSIS — C4441 Basal cell carcinoma of skin of scalp and neck: Secondary | ICD-10-CM | POA: Diagnosis not present

## 2016-07-20 ENCOUNTER — Other Ambulatory Visit: Payer: Self-pay | Admitting: Interventional Cardiology

## 2016-07-20 ENCOUNTER — Other Ambulatory Visit: Payer: PPO | Admitting: *Deleted

## 2016-07-20 DIAGNOSIS — E7800 Pure hypercholesterolemia, unspecified: Secondary | ICD-10-CM | POA: Diagnosis not present

## 2016-07-20 LAB — HEPATIC FUNCTION PANEL
ALK PHOS: 57 IU/L (ref 39–117)
ALT: 39 IU/L (ref 0–44)
AST: 42 IU/L — ABNORMAL HIGH (ref 0–40)
Albumin: 4.4 g/dL (ref 3.5–4.8)
BILIRUBIN, DIRECT: 0.23 mg/dL (ref 0.00–0.40)
Bilirubin Total: 0.6 mg/dL (ref 0.0–1.2)
Total Protein: 6.6 g/dL (ref 6.0–8.5)

## 2016-07-20 LAB — LIPID PANEL
Chol/HDL Ratio: 2.2 ratio (ref 0.0–5.0)
Cholesterol, Total: 127 mg/dL (ref 100–199)
HDL: 57 mg/dL (ref >39)
LDL Calculated: 51 mg/dL (ref 0–99)
TRIGLYCERIDES: 97 mg/dL (ref 0–149)
VLDL CHOLESTEROL CAL: 19 mg/dL (ref 5–40)

## 2016-07-20 NOTE — Addendum Note (Signed)
Addended by: Eulis Foster on: 07/20/2016 08:04 AM   Modules accepted: Orders

## 2016-07-20 NOTE — Addendum Note (Signed)
Addended by: Eulis Foster on: 07/20/2016 08:03 AM   Modules accepted: Orders

## 2016-07-26 DIAGNOSIS — M549 Dorsalgia, unspecified: Secondary | ICD-10-CM | POA: Diagnosis not present

## 2016-08-22 ENCOUNTER — Telehealth: Payer: Self-pay | Admitting: Interventional Cardiology

## 2016-08-22 NOTE — Telephone Encounter (Signed)
Pt already has Repatha, he has been getting it from [employer] for free. Called insurance back and they stated they need a copy of the approval letter in order for pt to continue to receive Repatha.   Faxed approval letter to [phone] attn Melissa.

## 2016-08-22 NOTE — Telephone Encounter (Signed)
New message    Melissa from [facility] is calling about pt prescription for Repatha. She said they need a copy of the letter from [place] helping the pt get this medication. She said that medical records wont assist with it. Please call.

## 2016-09-11 DIAGNOSIS — I10 Essential (primary) hypertension: Secondary | ICD-10-CM | POA: Diagnosis not present

## 2016-09-11 DIAGNOSIS — E7800 Pure hypercholesterolemia, unspecified: Secondary | ICD-10-CM | POA: Diagnosis not present

## 2016-09-25 DIAGNOSIS — D225 Melanocytic nevi of trunk: Secondary | ICD-10-CM | POA: Diagnosis not present

## 2016-09-25 DIAGNOSIS — X32XXXA Exposure to sunlight, initial encounter: Secondary | ICD-10-CM | POA: Diagnosis not present

## 2016-09-25 DIAGNOSIS — L538 Other specified erythematous conditions: Secondary | ICD-10-CM | POA: Diagnosis not present

## 2016-09-25 DIAGNOSIS — L905 Scar conditions and fibrosis of skin: Secondary | ICD-10-CM | POA: Diagnosis not present

## 2016-09-25 DIAGNOSIS — R208 Other disturbances of skin sensation: Secondary | ICD-10-CM | POA: Diagnosis not present

## 2016-09-25 DIAGNOSIS — L570 Actinic keratosis: Secondary | ICD-10-CM | POA: Diagnosis not present

## 2016-09-25 DIAGNOSIS — L820 Inflamed seborrheic keratosis: Secondary | ICD-10-CM | POA: Diagnosis not present

## 2016-09-30 ENCOUNTER — Other Ambulatory Visit: Payer: Self-pay | Admitting: Interventional Cardiology

## 2016-10-19 DIAGNOSIS — H2513 Age-related nuclear cataract, bilateral: Secondary | ICD-10-CM | POA: Diagnosis not present

## 2016-10-23 DIAGNOSIS — E7800 Pure hypercholesterolemia, unspecified: Secondary | ICD-10-CM | POA: Diagnosis not present

## 2016-10-29 ENCOUNTER — Telehealth: Payer: Self-pay | Admitting: Interventional Cardiology

## 2016-10-29 NOTE — Telephone Encounter (Signed)
New message     [employer] needs a new prescription for his renewal  and paperwork for the Repatha, they needs to be done before end of year

## 2016-10-30 NOTE — Telephone Encounter (Signed)
Returned call to pt. He states he was [employer], [employer] for his refill and was advised that this would be his last 3 month fill before he needs his new application for 4825. He is not planning on changing his insurance for next year. I will mail out new [place] form for pt and he will drop it off once completed.

## 2016-12-03 DIAGNOSIS — Z85828 Personal history of other malignant neoplasm of skin: Secondary | ICD-10-CM | POA: Diagnosis not present

## 2016-12-03 DIAGNOSIS — D2261 Melanocytic nevi of right upper limb, including shoulder: Secondary | ICD-10-CM | POA: Diagnosis not present

## 2016-12-03 DIAGNOSIS — D2272 Melanocytic nevi of left lower limb, including hip: Secondary | ICD-10-CM | POA: Diagnosis not present

## 2016-12-03 DIAGNOSIS — D225 Melanocytic nevi of trunk: Secondary | ICD-10-CM | POA: Diagnosis not present

## 2016-12-03 DIAGNOSIS — L820 Inflamed seborrheic keratosis: Secondary | ICD-10-CM | POA: Diagnosis not present

## 2016-12-03 DIAGNOSIS — L538 Other specified erythematous conditions: Secondary | ICD-10-CM | POA: Diagnosis not present

## 2016-12-03 DIAGNOSIS — X32XXXA Exposure to sunlight, initial encounter: Secondary | ICD-10-CM | POA: Diagnosis not present

## 2016-12-03 DIAGNOSIS — L570 Actinic keratosis: Secondary | ICD-10-CM | POA: Diagnosis not present

## 2016-12-03 DIAGNOSIS — B078 Other viral warts: Secondary | ICD-10-CM | POA: Diagnosis not present

## 2016-12-17 DIAGNOSIS — R972 Elevated prostate specific antigen [PSA]: Secondary | ICD-10-CM | POA: Diagnosis not present

## 2016-12-28 DIAGNOSIS — R351 Nocturia: Secondary | ICD-10-CM | POA: Diagnosis not present

## 2016-12-28 DIAGNOSIS — R3912 Poor urinary stream: Secondary | ICD-10-CM | POA: Diagnosis not present

## 2016-12-28 DIAGNOSIS — N401 Enlarged prostate with lower urinary tract symptoms: Secondary | ICD-10-CM | POA: Diagnosis not present

## 2016-12-28 DIAGNOSIS — R972 Elevated prostate specific antigen [PSA]: Secondary | ICD-10-CM | POA: Diagnosis not present

## 2016-12-28 DIAGNOSIS — N5201 Erectile dysfunction due to arterial insufficiency: Secondary | ICD-10-CM | POA: Diagnosis not present

## 2016-12-31 ENCOUNTER — Other Ambulatory Visit: Payer: Self-pay | Admitting: *Deleted

## 2016-12-31 DIAGNOSIS — E785 Hyperlipidemia, unspecified: Secondary | ICD-10-CM

## 2017-01-21 ENCOUNTER — Telehealth: Payer: Self-pay | Admitting: Interventional Cardiology

## 2017-01-21 NOTE — Telephone Encounter (Signed)
Spoke with patient and he has not yet heard from [employer]. He took his last dose today. Advised once closer to next injection will get samples for him. He states understanding.

## 2017-01-21 NOTE — Telephone Encounter (Signed)
New message     Have you got the paperwork filled out yet?   Patient calling the office for samples of medication:   1.  What medication and dosage are you requesting samples for? Repatha  2.  Are you currently out of this medication?  Just took his last injection.

## 2017-01-21 NOTE — Telephone Encounter (Signed)
LMOM

## 2017-01-29 ENCOUNTER — Other Ambulatory Visit: Payer: PPO | Admitting: *Deleted

## 2017-01-29 DIAGNOSIS — E785 Hyperlipidemia, unspecified: Secondary | ICD-10-CM | POA: Diagnosis not present

## 2017-01-29 DIAGNOSIS — M549 Dorsalgia, unspecified: Secondary | ICD-10-CM | POA: Diagnosis not present

## 2017-01-29 DIAGNOSIS — R509 Fever, unspecified: Secondary | ICD-10-CM | POA: Diagnosis not present

## 2017-01-29 LAB — LIPID PANEL
CHOLESTEROL TOTAL: 140 mg/dL (ref 100–199)
Chol/HDL Ratio: 2.6 ratio (ref 0.0–5.0)
HDL: 53 mg/dL (ref >39)
LDL Calculated: 58 mg/dL (ref 0–99)
Triglycerides: 144 mg/dL (ref 0–149)
VLDL Cholesterol Cal: 29 mg/dL (ref 5–40)

## 2017-01-29 LAB — HEPATIC FUNCTION PANEL
ALK PHOS: 56 IU/L (ref 39–117)
ALT: 19 IU/L (ref 0–44)
AST: 19 IU/L (ref 0–40)
Albumin: 4.2 g/dL (ref 3.5–4.8)
BILIRUBIN, DIRECT: 0.26 mg/dL (ref 0.00–0.40)
Bilirubin Total: 0.8 mg/dL (ref 0.0–1.2)
TOTAL PROTEIN: 6.4 g/dL (ref 6.0–8.5)

## 2017-01-29 NOTE — Telephone Encounter (Signed)
error 

## 2017-03-07 DIAGNOSIS — N529 Male erectile dysfunction, unspecified: Secondary | ICD-10-CM | POA: Diagnosis not present

## 2017-03-07 DIAGNOSIS — Z1211 Encounter for screening for malignant neoplasm of colon: Secondary | ICD-10-CM | POA: Diagnosis not present

## 2017-03-07 DIAGNOSIS — Z1389 Encounter for screening for other disorder: Secondary | ICD-10-CM | POA: Diagnosis not present

## 2017-03-07 DIAGNOSIS — I10 Essential (primary) hypertension: Secondary | ICD-10-CM | POA: Diagnosis not present

## 2017-03-07 DIAGNOSIS — I2510 Atherosclerotic heart disease of native coronary artery without angina pectoris: Secondary | ICD-10-CM | POA: Diagnosis not present

## 2017-03-07 DIAGNOSIS — N400 Enlarged prostate without lower urinary tract symptoms: Secondary | ICD-10-CM | POA: Diagnosis not present

## 2017-03-07 DIAGNOSIS — E7800 Pure hypercholesterolemia, unspecified: Secondary | ICD-10-CM | POA: Diagnosis not present

## 2017-03-07 DIAGNOSIS — Z0000 Encounter for general adult medical examination without abnormal findings: Secondary | ICD-10-CM | POA: Diagnosis not present

## 2017-03-26 ENCOUNTER — Other Ambulatory Visit: Payer: Self-pay | Admitting: Interventional Cardiology

## 2017-04-22 ENCOUNTER — Telehealth: Payer: Self-pay | Admitting: Interventional Cardiology

## 2017-04-22 NOTE — Telephone Encounter (Signed)
Pt is requesting to have those specific lipid profiles drawn and would like to know if Dr Tamala Julian could order them.

## 2017-04-22 NOTE — Telephone Encounter (Signed)
New Message  Pt is calling because his insurance states they need prior Authorization from Dr. Tamala Julian do have an NMR lipoprofile for LDL pattern A and LDL pattern B. Please call

## 2017-04-23 NOTE — Telephone Encounter (Signed)
We do not need an NMR. It does not change management from a cholesterol prospective at this time.

## 2017-04-23 NOTE — Telephone Encounter (Signed)
Let him know that we can order this test but his insurance company may not cover it.  We did not feel it is needed or will provide any beneficial information at this point.

## 2017-04-23 NOTE — Telephone Encounter (Signed)
Refresh my memory on why we need an NMR lipoprotein follow?  Did I requested it be done?  Did lipid clinic request?

## 2017-04-23 NOTE — Telephone Encounter (Signed)
Pt specifically asked for NMR vs regular lipid panel.  [facility] spoke with him and he wanted this type of panel because of the information it provides.

## 2017-04-24 NOTE — Telephone Encounter (Signed)
Spoke with pt and went over info from [place] and Dr. Tamala Julian.  Pt verbalized understanding and was appreciative for call.

## 2017-05-07 ENCOUNTER — Encounter: Payer: Self-pay | Admitting: Cardiology

## 2017-05-20 ENCOUNTER — Telehealth: Payer: Self-pay | Admitting: Interventional Cardiology

## 2017-05-20 NOTE — Telephone Encounter (Signed)
New message    Patient calling to request order for labs. Patient has ov 05/21/17.

## 2017-05-20 NOTE — Telephone Encounter (Signed)
Pt called and inquired about labs to be drawn before his appt with Cecilie Kicks on 5/7. I told him I did not see any orders for labs to be drawn, but he should come in fasting so they can draw lipids and cholesterol if need be. He verbalized understanding and had no additional questions.

## 2017-05-20 NOTE — Progress Notes (Addendum)
Cardiology Office Note   Date:  05/21/2017   ID:  Jason Blackwell, DOB 10/24/1945, MRN [id number]  PCP:  Lavone Orn, MD  Cardiologist:  Dr. Tamala Julian     Chief Complaint  Patient presents with  . Coronary Artery Disease      History of Present Illness: Jason Blackwell is a 72 y.o. male who presents for CAD follow up.    He has a history of CAD with ostial to proximal LAD DES 3 in 2015, aortic stenosis, hypertension, hyperlipidemia, and diastolic left ventricular dysfunction.  Today pt is pleased that his wt is down 18 lbs on high protein diet and low carbs, though he still has 2 glasses of wine at night  And still has lost wt.  He is walking 5 times a week 30 min at a time.  He has no chest pain.  No SOB.  He feels well  His granddaughter is getting married in the fall, so he wants to be able to wear old suits.    He is worried his LDL may increase on this diet.  He is asking about lipo-med panel.  We will check lipids today if no change or less no reason to check lipo-med.  No other meds to add. But he would know if diet was not good.       Past Medical History:  Diagnosis Date  . BPH (benign prostatic hypertrophy)   . Colon adenoma   . Coronary artery disease   . Ejection fraction   . Erectile dysfunction   . Fecal incontinence   . Heart murmur 5176   LVH, diastolic dysfunction, aortic sclerosis  . Hemorrhoids   . Hyperlipidemia   . Hypertension   . Insomnia   . Kidney stones    "passed them"    Past Surgical History:  Procedure Laterality Date  . CORONARY ANGIOPLASTY WITH STENT PLACEMENT  12/31/2013   "3"  . LEFT HEART CATHETERIZATION WITH CORONARY ANGIOGRAM N/A 12/31/2013   Procedure: LEFT HEART CATHETERIZATION WITH CORONARY ANGIOGRAM;  Surgeon: Sinclair Grooms, MD;  Location: [name] CATH LAB;  Service: Cardiovascular;  Laterality: N/A; Ostial and mid LAD stenting  . PROSTATE BIOPSY  ~ 1991  . SKIN LESION EXCISION Right 2013   precancerous lesion from arm    . TONSILLECTOMY       Current Outpatient Medications  Medication Sig Dispense Refill  . AMLODIPINE BESYLATE PO Take 10 mg by mouth daily.    [name] aspirin EC 81 MG tablet Take 81 mg by mouth daily.    . chlorthalidone (HYGROTON) 25 MG tablet TAKE 1 TABLET (25 MG TOTAL) BY MOUTH DAILY. 90 tablet 0  . Evolocumab (REPATHA SURECLICK) 160 MG/ML SOAJ Inject 140 mg into the skin every 14 (fourteen) days. 2 pen 11  . Meth-Hyo-M Bl-Na Phos-Ph Sal (URIBEL) 118 MG CAPS Take 1 capsule by mouth daily as needed (burning when urinating).    . sildenafil (REVATIO) 20 MG tablet Take 20 mg by mouth every 12 (twelve) hours as needed. FOR ED    . simethicone (MYLICON) 80 MG chewable tablet Chew 80 mg by mouth as directed. Take 5 tablets a day.  1 in the morning 2 at lunch and 2 at supper     No current facility-administered medications for this visit.     Allergies:   Simvastatin; Succinylcholine chloride; and Sulfamethoxazole    Social History:  The patient  reports that he has never smoked. He has never used smokeless  tobacco. He reports that he drinks about 16.8 oz of alcohol per week. He reports that he does not use drugs.   Family History:  The patient's family history includes Cancer - Colon in his maternal uncle; Diabetes in his sister; Heart attack in his father; Heart disease in his mother.    ROS:  General:no colds or fevers, + weight changes had increased since last visit and has lost 18 lbs since Jan 2019 Skin:no rashes or ulcers HEENT:no blurred vision, no congestion CV:see HPI PUL:see HPI GI:no diarrhea constipation or melena, no indigestion GU:no hematuria, no dysuria MS:no joint pain, no claudication Neuro:no syncope, no lightheadedness Endo:no diabetes, no thyroid disease  Wt Readings from Last 3 Encounters:  05/21/17 246 lb (111.6 kg)  06/05/16 255 lb (115.7 kg)  05/02/16 255 lb 6.4 oz (115.8 kg)     PHYSICAL EXAM: VS:  BP 138/76   Pulse 63   Ht 5\' 10"  (1.778 m)   Wt 246 lb  (111.6 kg)   SpO2 98%   BMI 35.30 kg/m  , BMI Body mass index is 35.3 kg/m. General:Pleasant affect, NAD Skin:Warm and dry, brisk capillary refill HEENT:normocephalic, sclera clear, mucus membranes moist Neck:supple, no JVD, no bruits  Heart:S1S2 RRR without murmur, gallup, rub or click Lungs:clear without rales, rhonchi, or wheezes JEH:UDJS, non tender, + BS, do not palpate liver spleen or masses Ext:no lower ext edema, 2+ pedal pulses, 2+ radial pulses Neuro:alert and oriented X 3, MAE, follows commands, + facial symmetry    EKG:  EKG is ordered today. The ekg ordered today demonstrates SR at 63 and no changes otherwise.     Recent Labs: 01/29/2017: ALT 19    Lipid Panel    Component Value Date/Time   CHOL 140 01/29/2017 0757   TRIG 144 01/29/2017 0757   HDL 53 01/29/2017 0757   CHOLHDL 2.6 01/29/2017 0757   LDLCALC 58 01/29/2017 0757       Other studies Reviewed: Additional studies/ records that were reviewed today include: . Echocardiogram 2015: Study Conclusions  - Left ventricle: The cavity size was normal. Systolic function was normal. The estimated ejection fraction was in the range of 60% to 65%. Wall motion was normal; there were no regional wall motion abnormalities. Doppler parameters are consistent with abnormal left ventricular relaxation (grade 1 diastolic dysfunction). Doppler parameters are consistent with high ventricular filling pressure. - Aortic valve: Cusp separation was reduced. There was mild to moderate stenosis. Peak velocity (S): 292 cm/s. Mean gradient (S): 21 mm Hg. - Mitral valve: Moderately calcified annulus. Mildly thickened leaflets . - Left atrium: The atrium was mildly dilated.  Cardiac catheterization thousand 2015: IMPRESSIONS:1. High-risk myocardial perfusion study with anterior ischemia due to ostial LAD of 95% 2. Successful stenting of the ostial LAD from 95% to 0% (4.0 mm post dilatation  diameter). 3. 80% mid LAD stenosis reduced to 30% with TIMI grade 3 flow after overlapping stenting and high pressure balloon inflations. Indentation on the proximal margin of the mis LAD stent could not be relieved. 4. Widely patent circumflex and RCA 5. Normal left ventricular systolic function  ASSESSMENT AND PLAN:  1.  Aortic stenosis, nonrheumatic stable, no SOB no syncope  2.  CAD with hx of stents 2015--no chest pain stable.  3.  HTN controlled  4.  HLD followed in lipid clinic.  On repatha and has been very good.  He would like lipids checked today on keto diet.    5. Pre-op   Colonoscopy- pt  planning in near future he is cleared for this he meets at least 4 mets without chest pain.   Current medicines are reviewed with the patient today.  The patient Has no concerns regarding medicines.  The following changes have been made:  See above Labs/ tests ordered today include:see above  Disposition:   FU:  see above  Signed, Cecilie Kicks, NP  05/21/2017 8:54 AM    [address], [place], [place] Phone: [phone]; Fax: [phone]

## 2017-05-21 ENCOUNTER — Encounter: Payer: Self-pay | Admitting: Cardiology

## 2017-05-21 ENCOUNTER — Encounter

## 2017-05-21 ENCOUNTER — Ambulatory Visit: Payer: PPO | Admitting: Cardiology

## 2017-05-21 VITALS — BP 138/76 | HR 63 | Ht 70.0 in | Wt 246.0 lb

## 2017-05-21 DIAGNOSIS — I350 Nonrheumatic aortic (valve) stenosis: Secondary | ICD-10-CM | POA: Diagnosis not present

## 2017-05-21 DIAGNOSIS — R634 Abnormal weight loss: Secondary | ICD-10-CM | POA: Diagnosis not present

## 2017-05-21 DIAGNOSIS — E782 Mixed hyperlipidemia: Secondary | ICD-10-CM

## 2017-05-21 DIAGNOSIS — I10 Essential (primary) hypertension: Secondary | ICD-10-CM | POA: Diagnosis not present

## 2017-05-21 DIAGNOSIS — I2510 Atherosclerotic heart disease of native coronary artery without angina pectoris: Secondary | ICD-10-CM | POA: Diagnosis not present

## 2017-05-21 LAB — COMPREHENSIVE METABOLIC PANEL
ALBUMIN: 4.6 g/dL (ref 3.5–4.8)
ALK PHOS: 49 IU/L (ref 39–117)
ALT: 27 IU/L (ref 0–44)
AST: 24 IU/L (ref 0–40)
Albumin/Globulin Ratio: 2.1 (ref 1.2–2.2)
BUN / CREAT RATIO: 20 (ref 10–24)
BUN: 16 mg/dL (ref 8–27)
Bilirubin Total: 0.7 mg/dL (ref 0.0–1.2)
CALCIUM: 9.4 mg/dL (ref 8.6–10.2)
CO2: 26 mmol/L (ref 20–29)
CREATININE: 0.81 mg/dL (ref 0.76–1.27)
Chloride: 100 mmol/L (ref 96–106)
GFR calc Af Amer: 103 mL/min/{1.73_m2} (ref >59)
GFR calc non Af Amer: 89 mL/min/{1.73_m2} (ref >59)
GLUCOSE: 117 mg/dL — AB (ref 65–99)
Globulin, Total: 2.2 g/dL (ref 1.5–4.5)
Potassium: 3.8 mmol/L (ref 3.5–5.2)
Sodium: 145 mmol/L — ABNORMAL HIGH (ref 134–144)
TOTAL PROTEIN: 6.8 g/dL (ref 6.0–8.5)

## 2017-05-21 LAB — LIPID PANEL
CHOLESTEROL TOTAL: 149 mg/dL (ref 100–199)
Chol/HDL Ratio: 2.5 ratio (ref 0.0–5.0)
HDL: 59 mg/dL (ref >39)
LDL CALC: 72 mg/dL (ref 0–99)
Triglycerides: 89 mg/dL (ref 0–149)
VLDL CHOLESTEROL CAL: 18 mg/dL (ref 5–40)

## 2017-05-21 NOTE — Patient Instructions (Signed)
Medication Instructions: Your physician recommends that you continue on your current medications as directed. Please refer to the Current Medication list given to you today.   Labwork: Today: Cmet & Lipids  Procedures/Testing: None Ordered  Follow-Up: Your physician wants you to follow-up in: 9 months with Dr.Smith You will receive a reminder letter in the mail two months in advance. If you don't receive a letter, please call our office to schedule the follow-up appointment.   Any Additional Special Instructions Will Be Listed Below (If Applicable).     If you need a refill on your cardiac medications before your next appointment, please call your pharmacy.

## 2017-05-22 ENCOUNTER — Telehealth: Payer: Self-pay

## 2017-05-22 NOTE — Telephone Encounter (Signed)
Pt is aware and agreeable to lab results and continuing current therapy and diet. He is aware and agreeable to trying to eat less fatty foods, sugar, and more plant based foods

## 2017-05-22 NOTE — Telephone Encounter (Signed)
-----   Message from Isaiah Serge, NP sent at 05/21/2017  4:48 PM EDT ----- Labs are stable, his LDL is slightly higher at 72 but that is stable.  I will check with Dr. Tamala Julian about lipomed, but the numbers are not bad enough to stop the diet for now but would only continue for 1-2 months more.  Dr. Tamala Julian usually encourages less fat less sugar and more plants.

## 2017-05-27 ENCOUNTER — Telehealth: Payer: Self-pay | Admitting: *Deleted

## 2017-05-27 NOTE — Telephone Encounter (Signed)
-----   Message from Isaiah Serge, NP sent at 05/23/2017 12:27 PM EDT ----- Will you please let pt know what Dr. Tamala Julian said below.  Thank you.   ----- Message ----- From: Belva Crome, MD Sent: 05/22/2017   6:04 PM To: Isaiah Serge, NP  An advanced lipid analysis will not really change anything.  Patient is stable on current therapy.  I have no strong feeling concerning the keto diet and I am very happy with the weight loss. ----- Message ----- From: Isaiah Serge, NP Sent: 05/21/2017   5:02 PM To: Belva Crome, MD  I saw Jason Blackwell,  He has been on [employer] and lost 18 lbs,  He wanted LDL checked and if increased he was not sure he would continue keto.  LDL is up from 55 to 71 - acceptable.  I did say only stay on keto for 2-3 months.  But he would like to know IF he should have lipomed to see if he should not do it at all.  He is already on everything he can be on.  But I will leave to you if he should have lipomed panel..  Thanks.

## 2017-05-27 NOTE — Telephone Encounter (Signed)
Called to make pt aware that per Dr. Tamala Julian, "An advanced lipid analysis will not really change anything.  Patient is stable on current therapy.  I have no strong feeling concerning the keto diet and I am very happy with the weight loss."  Pt verbalized understanding and thanked me for the call.

## 2017-06-17 DIAGNOSIS — D485 Neoplasm of uncertain behavior of skin: Secondary | ICD-10-CM | POA: Diagnosis not present

## 2017-06-17 DIAGNOSIS — C44311 Basal cell carcinoma of skin of nose: Secondary | ICD-10-CM | POA: Diagnosis not present

## 2017-06-17 DIAGNOSIS — L570 Actinic keratosis: Secondary | ICD-10-CM | POA: Diagnosis not present

## 2017-06-17 DIAGNOSIS — Z08 Encounter for follow-up examination after completed treatment for malignant neoplasm: Secondary | ICD-10-CM | POA: Diagnosis not present

## 2017-06-17 DIAGNOSIS — D367 Benign neoplasm of other specified sites: Secondary | ICD-10-CM | POA: Diagnosis not present

## 2017-06-17 DIAGNOSIS — Z85828 Personal history of other malignant neoplasm of skin: Secondary | ICD-10-CM | POA: Diagnosis not present

## 2017-06-17 DIAGNOSIS — X32XXXA Exposure to sunlight, initial encounter: Secondary | ICD-10-CM | POA: Diagnosis not present

## 2017-06-17 DIAGNOSIS — B078 Other viral warts: Secondary | ICD-10-CM | POA: Diagnosis not present

## 2017-06-17 DIAGNOSIS — Z872 Personal history of diseases of the skin and subcutaneous tissue: Secondary | ICD-10-CM | POA: Diagnosis not present

## 2017-06-17 DIAGNOSIS — Z09 Encounter for follow-up examination after completed treatment for conditions other than malignant neoplasm: Secondary | ICD-10-CM | POA: Diagnosis not present

## 2017-06-17 DIAGNOSIS — L821 Other seborrheic keratosis: Secondary | ICD-10-CM | POA: Diagnosis not present

## 2017-06-17 DIAGNOSIS — L538 Other specified erythematous conditions: Secondary | ICD-10-CM | POA: Diagnosis not present

## 2017-08-09 DIAGNOSIS — C44311 Basal cell carcinoma of skin of nose: Secondary | ICD-10-CM | POA: Diagnosis not present

## 2017-09-11 DIAGNOSIS — I2510 Atherosclerotic heart disease of native coronary artery without angina pectoris: Secondary | ICD-10-CM | POA: Diagnosis not present

## 2017-09-11 DIAGNOSIS — I10 Essential (primary) hypertension: Secondary | ICD-10-CM | POA: Diagnosis not present

## 2017-09-11 DIAGNOSIS — N400 Enlarged prostate without lower urinary tract symptoms: Secondary | ICD-10-CM | POA: Diagnosis not present

## 2017-09-11 DIAGNOSIS — M2550 Pain in unspecified joint: Secondary | ICD-10-CM | POA: Diagnosis not present

## 2017-09-11 DIAGNOSIS — Z6837 Body mass index (BMI) 37.0-37.9, adult: Secondary | ICD-10-CM | POA: Diagnosis not present

## 2017-09-12 DIAGNOSIS — Z86010 Personal history of colonic polyps: Secondary | ICD-10-CM | POA: Diagnosis not present

## 2017-09-12 DIAGNOSIS — K649 Unspecified hemorrhoids: Secondary | ICD-10-CM | POA: Diagnosis not present

## 2017-09-18 DIAGNOSIS — N401 Enlarged prostate with lower urinary tract symptoms: Secondary | ICD-10-CM | POA: Diagnosis not present

## 2017-09-18 DIAGNOSIS — R351 Nocturia: Secondary | ICD-10-CM | POA: Diagnosis not present

## 2017-09-18 DIAGNOSIS — R972 Elevated prostate specific antigen [PSA]: Secondary | ICD-10-CM | POA: Diagnosis not present

## 2017-10-23 DIAGNOSIS — Z86010 Personal history of colonic polyps: Secondary | ICD-10-CM | POA: Diagnosis not present

## 2017-10-23 DIAGNOSIS — K642 Third degree hemorrhoids: Secondary | ICD-10-CM | POA: Diagnosis not present

## 2017-10-23 DIAGNOSIS — D123 Benign neoplasm of transverse colon: Secondary | ICD-10-CM | POA: Diagnosis not present

## 2017-10-25 DIAGNOSIS — D123 Benign neoplasm of transverse colon: Secondary | ICD-10-CM | POA: Diagnosis not present

## 2017-11-11 DIAGNOSIS — I2510 Atherosclerotic heart disease of native coronary artery without angina pectoris: Secondary | ICD-10-CM | POA: Diagnosis not present

## 2017-11-11 DIAGNOSIS — N400 Enlarged prostate without lower urinary tract symptoms: Secondary | ICD-10-CM | POA: Diagnosis not present

## 2017-11-11 DIAGNOSIS — I10 Essential (primary) hypertension: Secondary | ICD-10-CM | POA: Diagnosis not present

## 2017-11-13 DIAGNOSIS — Z23 Encounter for immunization: Secondary | ICD-10-CM | POA: Diagnosis not present

## 2017-11-25 DIAGNOSIS — I2510 Atherosclerotic heart disease of native coronary artery without angina pectoris: Secondary | ICD-10-CM | POA: Diagnosis not present

## 2017-11-25 DIAGNOSIS — I10 Essential (primary) hypertension: Secondary | ICD-10-CM | POA: Diagnosis not present

## 2017-11-25 DIAGNOSIS — N400 Enlarged prostate without lower urinary tract symptoms: Secondary | ICD-10-CM | POA: Diagnosis not present

## 2017-12-09 ENCOUNTER — Telehealth: Payer: Self-pay | Admitting: Pharmacist

## 2017-12-09 NOTE — Telephone Encounter (Signed)
Pt called about [place]. Application placed in mail to patient. He will complete his part and return to our office so that we can fax to [facility].

## 2017-12-16 ENCOUNTER — Other Ambulatory Visit: Payer: Self-pay | Admitting: Interventional Cardiology

## 2018-01-06 DIAGNOSIS — J209 Acute bronchitis, unspecified: Secondary | ICD-10-CM | POA: Diagnosis not present

## 2018-01-10 DIAGNOSIS — I10 Essential (primary) hypertension: Secondary | ICD-10-CM | POA: Diagnosis not present

## 2018-01-10 DIAGNOSIS — I2510 Atherosclerotic heart disease of native coronary artery without angina pectoris: Secondary | ICD-10-CM | POA: Diagnosis not present

## 2018-01-10 DIAGNOSIS — N400 Enlarged prostate without lower urinary tract symptoms: Secondary | ICD-10-CM | POA: Diagnosis not present

## 2018-01-13 DIAGNOSIS — J209 Acute bronchitis, unspecified: Secondary | ICD-10-CM | POA: Diagnosis not present

## 2018-01-24 DIAGNOSIS — N401 Enlarged prostate with lower urinary tract symptoms: Secondary | ICD-10-CM | POA: Diagnosis not present

## 2018-01-24 DIAGNOSIS — R351 Nocturia: Secondary | ICD-10-CM | POA: Diagnosis not present

## 2018-01-29 NOTE — Telephone Encounter (Signed)
Pt approved for pt assistance for Repatha through the [employer] for 2020. Pt is aware.

## 2018-03-10 DIAGNOSIS — N400 Enlarged prostate without lower urinary tract symptoms: Secondary | ICD-10-CM | POA: Diagnosis not present

## 2018-03-10 DIAGNOSIS — I10 Essential (primary) hypertension: Secondary | ICD-10-CM | POA: Diagnosis not present

## 2018-03-10 DIAGNOSIS — I2510 Atherosclerotic heart disease of native coronary artery without angina pectoris: Secondary | ICD-10-CM | POA: Diagnosis not present

## 2018-03-14 DIAGNOSIS — Z0000 Encounter for general adult medical examination without abnormal findings: Secondary | ICD-10-CM | POA: Diagnosis not present

## 2018-03-14 DIAGNOSIS — N400 Enlarged prostate without lower urinary tract symptoms: Secondary | ICD-10-CM | POA: Diagnosis not present

## 2018-03-14 DIAGNOSIS — I2510 Atherosclerotic heart disease of native coronary artery without angina pectoris: Secondary | ICD-10-CM | POA: Diagnosis not present

## 2018-03-14 DIAGNOSIS — Z6839 Body mass index (BMI) 39.0-39.9, adult: Secondary | ICD-10-CM | POA: Diagnosis not present

## 2018-03-14 DIAGNOSIS — E7800 Pure hypercholesterolemia, unspecified: Secondary | ICD-10-CM | POA: Diagnosis not present

## 2018-03-14 DIAGNOSIS — I10 Essential (primary) hypertension: Secondary | ICD-10-CM | POA: Diagnosis not present

## 2018-03-14 DIAGNOSIS — Z1389 Encounter for screening for other disorder: Secondary | ICD-10-CM | POA: Diagnosis not present

## 2018-03-14 DIAGNOSIS — K219 Gastro-esophageal reflux disease without esophagitis: Secondary | ICD-10-CM | POA: Diagnosis not present

## 2018-05-07 ENCOUNTER — Ambulatory Visit: Payer: PPO | Admitting: Interventional Cardiology

## 2018-05-07 DIAGNOSIS — I2510 Atherosclerotic heart disease of native coronary artery without angina pectoris: Secondary | ICD-10-CM | POA: Diagnosis not present

## 2018-05-07 DIAGNOSIS — N400 Enlarged prostate without lower urinary tract symptoms: Secondary | ICD-10-CM | POA: Diagnosis not present

## 2018-05-07 DIAGNOSIS — I10 Essential (primary) hypertension: Secondary | ICD-10-CM | POA: Diagnosis not present

## 2018-06-10 DIAGNOSIS — I10 Essential (primary) hypertension: Secondary | ICD-10-CM | POA: Diagnosis not present

## 2018-06-10 DIAGNOSIS — I2510 Atherosclerotic heart disease of native coronary artery without angina pectoris: Secondary | ICD-10-CM | POA: Diagnosis not present

## 2018-06-10 DIAGNOSIS — N400 Enlarged prostate without lower urinary tract symptoms: Secondary | ICD-10-CM | POA: Diagnosis not present

## 2018-06-16 DIAGNOSIS — N400 Enlarged prostate without lower urinary tract symptoms: Secondary | ICD-10-CM | POA: Diagnosis not present

## 2018-06-16 DIAGNOSIS — I10 Essential (primary) hypertension: Secondary | ICD-10-CM | POA: Diagnosis not present

## 2018-06-16 DIAGNOSIS — I2510 Atherosclerotic heart disease of native coronary artery without angina pectoris: Secondary | ICD-10-CM | POA: Diagnosis not present

## 2018-06-27 DIAGNOSIS — N401 Enlarged prostate with lower urinary tract symptoms: Secondary | ICD-10-CM | POA: Diagnosis not present

## 2018-06-27 DIAGNOSIS — R972 Elevated prostate specific antigen [PSA]: Secondary | ICD-10-CM | POA: Diagnosis not present

## 2018-06-27 DIAGNOSIS — R351 Nocturia: Secondary | ICD-10-CM | POA: Diagnosis not present

## 2018-07-11 DIAGNOSIS — H2513 Age-related nuclear cataract, bilateral: Secondary | ICD-10-CM | POA: Diagnosis not present

## 2018-07-11 DIAGNOSIS — H33312 Horseshoe tear of retina without detachment, left eye: Secondary | ICD-10-CM | POA: Diagnosis not present

## 2018-07-22 NOTE — Progress Notes (Signed)
Cardiology Office Note:    Date:  07/23/2018   ID:  Jason Blackwell, DOB 04-24-1945, MRN [id number]  PCP:  Lavone Orn, MD  Cardiologist:  Sinclair Grooms, MD   Referring MD: Lavone Orn, MD   Chief Complaint  Patient presents with   Cardiac Valve Problem   Coronary Artery Disease    History of Present Illness:    Jason Blackwell is a 73 y.o. male with a hx of CADwith ostial to proximal LAD DES 3 in 2015, aortic stenosis, hypertension, hyperlipidemia, and diastolic left ventricular dysfunction.  Having some dyspnea on exertion.  He walks about 2-1/2 miles each day.  When walking up an incline in his neighborhood the shortness of breath is noticeable.  There is no associated chest pain.  He denies orthopnea, PND, palpitations, syncope, and claudication.  Past Medical History:  Diagnosis Date   BPH (benign prostatic hypertrophy)    Colon adenoma    Coronary artery disease    Ejection fraction    Erectile dysfunction    Fecal incontinence    Heart murmur 8366   LVH, diastolic dysfunction, aortic sclerosis   Hemorrhoids    Hyperlipidemia    Hypertension    Insomnia    Kidney stones    "passed them"    Past Surgical History:  Procedure Laterality Date   CORONARY ANGIOPLASTY WITH STENT PLACEMENT  12/31/2013   "3"   LEFT HEART CATHETERIZATION WITH CORONARY ANGIOGRAM N/A 12/31/2013   Procedure: LEFT HEART CATHETERIZATION WITH CORONARY ANGIOGRAM;  Surgeon: Sinclair Grooms, MD;  Location: [facility] CATH LAB;  Service: Cardiovascular;  Laterality: N/A; Ostial and mid LAD stenting   PROSTATE BIOPSY  ~ [place] Right 2013   precancerous lesion from arm   TONSILLECTOMY      Current Medications: Current Meds  Medication Sig   AMLODIPINE BESYLATE PO Take 10 mg by mouth daily.   aspirin EC 81 MG tablet Take 81 mg by mouth daily.   chlorthalidone (HYGROTON) 25 MG tablet TAKE 1 TABLET (25 MG TOTAL) BY MOUTH DAILY.   Evolocumab  (REPATHA SURECLICK) 294 MG/ML SOAJ Inject 140 mg into the skin every 14 (fourteen) days.   losartan (COZAAR) 100 MG tablet Take 100 mg by mouth daily.   sildenafil (REVATIO) 20 MG tablet Take 20 mg by mouth every 12 (twelve) hours as needed. FOR ED   simethicone (MYLICON) 80 MG chewable tablet Chew 80 mg by mouth as needed.      Allergies:   Simvastatin, Succinylcholine chloride, and Sulfamethoxazole   Social History   Socioeconomic History   Marital status: Married    Spouse name: Not on file   Number of children: Not on file   Years of education: Not on file   Highest education level: Not on file  Occupational History   Not on file  Social Needs   Financial resource strain: Not on file   Food insecurity    Worry: Not on file    Inability: Not on file   Transportation needs    Medical: Not on file    Non-medical: Not on file  Tobacco Use   Smoking status: Never Smoker   Smokeless tobacco: Never Used  Substance and Sexual Activity   Alcohol use: Yes    Alcohol/week: 28.0 standard drinks    Types: 28 Glasses of wine per week   Drug use: No   Sexual activity: Yes  Lifestyle   Physical activity  Days per week: Not on file    Minutes per session: Not on file   Stress: Not on file  Relationships   Social connections    Talks on phone: Not on file    Gets together: Not on file    Attends religious service: Not on file    Active member of club or organization: Not on file    Attends meetings of clubs or organizations: Not on file    Relationship status: Not on file  Other Topics Concern   Not on file  Social History Narrative   Not on file     Family History: The patient's family history includes Cancer - Colon in his maternal uncle; Diabetes in his sister; Heart attack in his father; Heart disease in his mother.  ROS:   Please see the history of present illness.    Tolerating Repatha without difficulty.  All other systems reviewed and are  negative.  EKGs/Labs/Other Studies Reviewed:    The following studies were reviewed today: ECHOCARDIOGRAM 2018: Study Conclusions  - Left ventricle: The cavity size was normal. There was mild   concentric hypertrophy. Systolic function was normal. The   estimated ejection fraction was in the range of 55% to 60%. Wall   motion was normal; there were no regional wall motion   abnormalities. Doppler parameters are consistent with abnormal   left ventricular relaxation (grade 1 diastolic dysfunction).   Doppler parameters are consistent with indeterminate ventricular   filling pressure. - Aortic valve: There was mild stenosis. There was no   regurgitation. Peak velocity (S): 237 cm/s. Mean gradient (S): 12   mm Hg. - Aorta: Ascending aortic diameter: 36 mm (S). - Mitral valve: Mildly calcified annulus. Transvalvular velocity   was within the normal range. There was no evidence for stenosis.   There was no regurgitation. - Right ventricle: The cavity size was normal. Wall thickness was   normal. Systolic function was normal. - Tricuspid valve: There was no regurgitation.  EKG:  EKG sinus bradycardia, 58 bpm.  First-degree AV block.  Recent Labs: No results found for requested labs within last 8760 hours.  Recent Lipid Panel    Component Value Date/Time   CHOL 149 05/21/2017 0930   TRIG 89 05/21/2017 0930   HDL 59 05/21/2017 0930   CHOLHDL 2.5 05/21/2017 0930   LDLCALC 72 05/21/2017 0930    Physical Exam:    VS:  BP (!) 148/80    Pulse (!) 58    Ht 5\' 10"  (1.778 m)    Wt 252 lb (114.3 kg)    SpO2 96%    BMI 36.16 kg/m     Wt Readings from Last 3 Encounters:  07/23/18 252 lb (114.3 kg)  05/21/17 246 lb (111.6 kg)  06/05/16 255 lb (115.7 kg)     GEN: . No acute distress HEENT: Normal NECK: No JVD. LYMPHATICS: No lymphadenopathy CARDIAC: RRR.  2-3 over 6 left lower sternal and right upper sternal systolic aortic stenosis murmur, soft S4 gallop, no edema VASCULAR: 2+  bilateral radial and posterior tibial pulses, faint bilaterally transmitted carotid bruits RESPIRATORY:  Clear to auscultation without rales, wheezing or rhonchi  ABDOMEN: Soft, non-tender, non-distended, No pulsatile mass, MUSCULOSKELETAL: No deformity  SKIN: Warm and dry NEUROLOGIC:  Alert and oriented x 3 PSYCHIATRIC:  Normal affect   ASSESSMENT:    1. Coronary artery disease involving native coronary artery of native heart without angina pectoris   2. Nonrheumatic aortic valve stenosis  3. Essential hypertension   4. Mixed hyperlipidemia    PLAN:    In order of problems listed above:  1. Stable.  Aggressive lipid-lowering with Repatha.  Most recent LDL was 72.  Secondary prevention discussed. 2. Mild aortic stenosis, 2018.  With exertional dyspnea, will repeat echo cardiogram. 3. Target blood pressure 130/80 mmHg.  Low-salt diet discussed. 4. LDL target less than 70.  Overall education and awareness concerning primary/secondary risk prevention was discussed in detail: LDL less than 70, hemoglobin A1c less than 7, blood pressure target less than 130/80 mmHg, >150 minutes of moderate aerobic activity per week, avoidance of smoking, weight control (via diet and exercise), and continued surveillance/management of/for obstructive sleep apnea.    Medication Adjustments/Labs and Tests Ordered: Current medicines are reviewed at length with the patient today.  Concerns regarding medicines are outlined above.  Orders Placed This Encounter  Procedures   EKG 12-Lead   ECHOCARDIOGRAM COMPLETE   No orders of the defined types were placed in this encounter.   Patient Instructions  Medication Instructions:  Your physician recommends that you continue on your current medications as directed. Please refer to the Current Medication list given to you today.  If you need a refill on your cardiac medications before your next appointment, please call your pharmacy.   Lab work: None If  you have labs (blood work) drawn today and your tests are completely normal, you will receive your results only by:  [place] (if you have MyChart) OR  A paper copy in the mail If you have any lab test that is abnormal or we need to change your treatment, we will call you to review the results.  Testing/Procedures: Your physician has requested that you have an echocardiogram anytime between now and when we see you back in July 2021. Echocardiography is a painless test that uses sound waves to create images of your heart. It provides your doctor with information about the size and shape of your heart and how well your hearts chambers and valves are working. This procedure takes approximately one hour. There are no restrictions for this procedure.   Follow-Up: At [facility], you and your health needs are our priority.  As part of our continuing mission to provide you with exceptional heart care, we have created designated Provider Care Teams.  These Care Teams include your primary Cardiologist (physician) and Advanced Practice Providers (APPs -  Physician Assistants and Nurse Practitioners) who all work together to provide you with the care you need, when you need it. You will need a follow up appointment in 12 months.  Please call our office 2 months in advance to schedule this appointment.  You may see Sinclair Grooms, MD or one of the following Advanced Practice Providers on your designated Care Team:   Truitt Merle, NP Cecilie Kicks, NP  Kathyrn Drown, NP  Any Other Special Instructions Will Be Listed Below (If Applicable).       Signed, Sinclair Grooms, MD  07/23/2018 3:02 PM    [facility]

## 2018-07-23 ENCOUNTER — Ambulatory Visit: Payer: PPO | Admitting: Interventional Cardiology

## 2018-07-23 ENCOUNTER — Other Ambulatory Visit: Payer: Self-pay

## 2018-07-23 ENCOUNTER — Encounter: Payer: Self-pay | Admitting: Interventional Cardiology

## 2018-07-23 ENCOUNTER — Telehealth: Payer: Self-pay | Admitting: Interventional Cardiology

## 2018-07-23 VITALS — BP 148/80 | HR 58 | Ht 70.0 in | Wt 252.0 lb

## 2018-07-23 DIAGNOSIS — I2510 Atherosclerotic heart disease of native coronary artery without angina pectoris: Secondary | ICD-10-CM

## 2018-07-23 DIAGNOSIS — I10 Essential (primary) hypertension: Secondary | ICD-10-CM

## 2018-07-23 DIAGNOSIS — E782 Mixed hyperlipidemia: Secondary | ICD-10-CM | POA: Diagnosis not present

## 2018-07-23 DIAGNOSIS — I350 Nonrheumatic aortic (valve) stenosis: Secondary | ICD-10-CM

## 2018-07-23 NOTE — Telephone Encounter (Signed)

## 2018-07-23 NOTE — Patient Instructions (Signed)
Medication Instructions:  Your physician recommends that you continue on your current medications as directed. Please refer to the Current Medication list given to you today.  If you need a refill on your cardiac medications before your next appointment, please call your pharmacy.   Lab work: None If you have labs (blood work) drawn today and your tests are completely normal, you will receive your results only by: [name] MyChart Message (if you have MyChart) OR . A paper copy in the mail If you have any lab test that is abnormal or we need to change your treatment, we will call you to review the results.  Testing/Procedures: Your physician has requested that you have an echocardiogram anytime between now and when we see you back in July 2021. Echocardiography is a painless test that uses sound waves to create images of your heart. It provides your doctor with information about the size and shape of your heart and how well your heart's chambers and valves are working. This procedure takes approximately one hour. There are no restrictions for this procedure.   Follow-Up: At [facility], you and your health needs are our priority.  As part of our continuing mission to provide you with exceptional heart care, we have created designated Provider Care Teams.  These Care Teams include your primary Cardiologist (physician) and Advanced Practice Providers (APPs -  Physician Assistants and Nurse Practitioners) who all work together to provide you with the care you need, when you need it. You will need a follow up appointment in 12 months.  Please call our office 2 months in advance to schedule this appointment.  You may see Sinclair Grooms, MD or one of the following Advanced Practice Providers on your designated Care Team:   Truitt Merle, NP Cecilie Kicks, NP . Kathyrn Drown, NP  Any Other Special Instructions Will Be Listed Below (If Applicable).

## 2018-08-01 ENCOUNTER — Other Ambulatory Visit: Payer: Self-pay | Admitting: Pharmacist

## 2018-08-17 ENCOUNTER — Emergency Department: Payer: PPO

## 2018-08-17 ENCOUNTER — Other Ambulatory Visit: Payer: Self-pay

## 2018-08-17 ENCOUNTER — Emergency Department
Admission: EM | Admit: 2018-08-17 | Discharge: 2018-08-17 | Disposition: A | Discharge status: Home / Self Care | Payer: PPO | Attending: Emergency Medicine | Admitting: Emergency Medicine

## 2018-08-17 DIAGNOSIS — M25561 Pain in right knee: Secondary | ICD-10-CM | POA: Insufficient documentation

## 2018-08-17 DIAGNOSIS — Z79899 Other long term (current) drug therapy: Secondary | ICD-10-CM | POA: Diagnosis not present

## 2018-08-17 DIAGNOSIS — R52 Pain, unspecified: Secondary | ICD-10-CM | POA: Diagnosis not present

## 2018-08-17 DIAGNOSIS — I2510 Atherosclerotic heart disease of native coronary artery without angina pectoris: Secondary | ICD-10-CM | POA: Insufficient documentation

## 2018-08-17 DIAGNOSIS — M79661 Pain in right lower leg: Secondary | ICD-10-CM | POA: Diagnosis not present

## 2018-08-17 DIAGNOSIS — Z7982 Long term (current) use of aspirin: Secondary | ICD-10-CM | POA: Insufficient documentation

## 2018-08-17 DIAGNOSIS — I10 Essential (primary) hypertension: Secondary | ICD-10-CM | POA: Insufficient documentation

## 2018-08-17 DIAGNOSIS — T07XXXA Unspecified multiple injuries, initial encounter: Secondary | ICD-10-CM | POA: Diagnosis not present

## 2018-08-17 MED ORDER — TIZANIDINE HCL 2 MG PO CAPS: 2.0000 mg | ORAL_CAPSULE | Freq: Two times a day (BID) | ORAL | 0 refills | Status: DC | PRN

## 2018-08-17 MED ORDER — NAPROXEN 250 MG PO TABS: 500.0000 mg | ORAL_TABLET | Freq: Two times a day (BID) | ORAL | 0 refills | Status: DC

## 2018-08-17 NOTE — ED Notes (Signed)
First Nurse Note: Pt to ED via [facility] for right knee pain. VSS. EMS reports that pt is unable to stand on his right leg .

## 2018-08-17 NOTE — ED Provider Notes (Signed)
[facility] Emergency Department Provider Note ____________________________________________   First MD Initiated Contact with Patient 08/17/18 1353     (approximate)  I have reviewed the triage vital signs and the nursing notes.   HISTORY  Chief Complaint Knee Pain  HPI Jason Blackwell is a 73 y.o. male who presents to the ER for treatment and evaluation of knee pain that has been intermittent over the past 3 days. Pain worsened today after watering plants. No history of DVT.    Past Medical History:  Diagnosis Date   BPH (benign prostatic hypertrophy)    Colon adenoma    Coronary artery disease    Ejection fraction    Erectile dysfunction    Fecal incontinence    Heart murmur 7035   LVH, diastolic dysfunction, aortic sclerosis   Hemorrhoids    Hyperlipidemia    Hypertension    Insomnia    Kidney stones    "passed them"    Patient Active Problem List   Diagnosis Date Noted   Hemorrhoids 10/11/2014   CAD (coronary artery disease), native coronary artery 01/17/2014   Erectile dysfunction 01/17/2014   Allergy to statin medication 12/30/2013   Elevated PSA 12/30/2013   Aortic stenosis 12/30/2013   Ejection fraction    Hyperlipidemia 07/29/2006   Essential hypertension 07/29/2006   BENIGN PROSTATIC HYPERTROPHY 07/29/2006   NEPHROLITHIASIS, HX OF 07/29/2006    Past Surgical History:  Procedure Laterality Date   CORONARY ANGIOPLASTY WITH STENT PLACEMENT  12/31/2013   "3"   LEFT HEART CATHETERIZATION WITH CORONARY ANGIOGRAM N/A 12/31/2013   Procedure: LEFT HEART CATHETERIZATION WITH CORONARY ANGIOGRAM;  Surgeon: Sinclair Grooms, MD;  Location: [facility] CATH LAB;  Service: Cardiovascular;  Laterality: N/A; Ostial and mid LAD stenting   PROSTATE BIOPSY  ~ [place] Right 2013   precancerous lesion from arm   TONSILLECTOMY      Prior to Admission medications   Medication Sig Start Date End Date  Taking? Authorizing Provider  AMLODIPINE BESYLATE PO Take 10 mg by mouth daily.    [provider]  aspirin EC 81 MG tablet Take 81 mg by mouth daily.    [provider]  chlorthalidone (HYGROTON) 25 MG tablet TAKE 1 TABLET (25 MG TOTAL) BY MOUTH DAILY. 12/17/17   Belva Crome, MD  Evolocumab (REPATHA SURECLICK) 009 MG/ML SOAJ Inject 140 mg into the skin every 14 (fourteen) days. 05/14/16   Belva Crome, MD  losartan (COZAAR) 100 MG tablet Take 100 mg by mouth daily. 06/30/18   [provider]  naproxen (NAPROSYN) 250 MG tablet Take 2 tablets (500 mg total) by mouth 2 (two) times daily with a meal. 08/17/18   Maykayla Highley B, FNP  sildenafil (REVATIO) 20 MG tablet Take 20 mg by mouth every 12 (twelve) hours as needed. FOR ED    [provider]  simethicone (MYLICON) 80 MG chewable tablet Chew 80 mg by mouth as needed.     [provider]  tizanidine (ZANAFLEX) 2 MG capsule Take 1 capsule (2 mg total) by mouth 2 (two) times daily as needed for muscle spasms. 08/17/18   Virgil Lightner, Johnette Abraham B, FNP    Allergies Simvastatin, Succinylcholine chloride, and Sulfamethoxazole  Family History  Problem Relation Age of Onset   Heart disease Mother    Heart attack Father    Diabetes Sister    Cancer - Colon Maternal Uncle     Social History Social History  Tobacco Use   Smoking status: Never Smoker   Smokeless tobacco: Never Used  Substance Use Topics   Alcohol use: Yes    Alcohol/week: 28.0 standard drinks    Types: 28 Glasses of wine per week   Drug use: No    Review of Systems  Constitutional: No fever/chills Eyes: No visual changes. ENT: No sore throat. Cardiovascular: Denies chest pain. Respiratory: Denies shortness of breath. Gastrointestinal: No abdominal pain.  No nausea, no vomiting.  No diarrhea.  No constipation. Genitourinary: Negative for dysuria. Musculoskeletal: Negative for back pain. Skin: Negative for  rash. Neurological: Negative for headaches, focal weakness or numbness. ____________________________________________   PHYSICAL EXAM:  VITAL SIGNS: ED Triage Vitals [08/17/18 1410]  Enc Vitals Group     BP (!) 146/69     Pulse Rate (!) 59     Resp 16     Temp 98.3 F (36.8 C)     Temp Source Oral     SpO2 98 %     Weight 242 lb (109.8 kg)     Height 5\' 11"  (1.803 m)     Head Circumference      Peak Flow      Pain Score 10     Pain Loc      Pain Edu?      Excl. in [place]?     Constitutional: Alert and oriented. Well appearing and in no acute distress.  Eyes: Conjunctivae are normal. PERRL. EOMI. Head: Atraumatic. Nose: No congestion/rhinnorhea. Mouth/Throat: Mucous membranes are moist.  Oropharynx non-erythematous. Neck: No stridor.   Hematological/Lymphatic/Immunilogical: No cervical lymphadenopathy. Cardiovascular: Normal rate, regular rhythm. Grossly normal heart sounds.  Good peripheral circulation.  PT and DP pulses are equal and 2+ Respiratory: Normal respiratory effort.  No retractions. Lungs CTAB. Gastrointestinal: Soft and nontender. No distention. No abdominal bruits. No CVA tenderness. Genitourinary:  Musculoskeletal: No lower extremity tenderness nor edema.  No joint effusions. Neurologic:  Normal speech and language. No gross focal neurologic deficits are appreciated. No gait instability. Skin:  Skin is warm, dry and intact. No rash noted. Psychiatric: Mood and affect are normal. Speech and behavior are normal.  ____________________________________________   LABS (all labs ordered are listed, but only abnormal results are displayed)  Labs Reviewed - No data to display ____________________________________________  EKG  Not indicated ____________________________________________  RADIOLOGY  ED MD interpretation:    Ultrasound of the right lower extremity is negative for DVT. X-ray is negative for acute findings.  Official radiology report(s): US  Venous Img Lower Unilateral Right  Result Date: 08/17/2018 CLINICAL DATA:  Right calf region pain EXAM: RIGHT LOWER EXTREMITY VENOUS DUPLEX ULTRASOUND TECHNIQUE: Gray-scale sonography with graded compression, as well as color Doppler and duplex ultrasound were performed to evaluate the right lower extremity deep venous system from the level of the common femoral vein and including the common femoral, femoral, profunda femoral, popliteal and calf veins including the posterior tibial, peroneal and gastrocnemius veins when visible. The superficial great saphenous vein was also interrogated. Spectral Doppler was utilized to evaluate flow at rest and with distal augmentation maneuvers in the common femoral, femoral and popliteal veins. COMPARISON:  None. FINDINGS: Contralateral Common Femoral Vein: Respiratory phasicity is normal and symmetric with the symptomatic side. No evidence of thrombus. Normal compressibility. Common Femoral Vein: No evidence of thrombus. Normal compressibility, respiratory phasicity and response to augmentation. Saphenofemoral Junction: No evidence of thrombus. Normal compressibility and flow on color Doppler imaging. Profunda Femoral Vein: No evidence of thrombus. Normal compressibility  and flow on color Doppler imaging. Femoral Vein: No evidence of thrombus. Normal compressibility, respiratory phasicity and response to augmentation. Popliteal Vein: No evidence of thrombus. Normal compressibility, respiratory phasicity and response to augmentation. Calf Veins: No evidence of thrombus. Normal compressibility and flow on color Doppler imaging. Superficial Great Saphenous Vein: No evidence of thrombus. Normal compressibility. Venous Reflux:  None. Other Findings:  None. IMPRESSION: No evidence of deep venous thrombosis in the right lower extremity. Left common femoral vein also patent. Electronically Signed   By: Lowella Grip III M.D.   On: 08/17/2018 15:31   Dg Knee Complete 4 Views  Right  Result Date: 08/17/2018 CLINICAL DATA:  Right knee pain. EXAM: RIGHT KNEE - COMPLETE 4+ VIEW COMPARISON:  None. FINDINGS: No joint effusion identified. There is no fracture or dislocation identified. Sharpening the tibial spines identified. IMPRESSION: 1. No acute findings. 2. Mild degenerative changes. Electronically Signed   By: Kerby Moors M.D.   On: 08/17/2018 16:34    ____________________________________________   PROCEDURES  Procedure(s) performed (including Critical Care):  Procedures  ____________________________________________   INITIAL IMPRESSION / ASSESSMENT AND PLAN     73 year old male presenting to the emergency department for gradual onset of pain in the popliteal area on the right knee with right calf pain.  See HPI for further details.  Plan will be to do an ultrasound to rule out DVT.  DIFFERENTIAL DIAGNOSIS includes but is not limited to  DVT, Baker's cyst, musculoskeletal strain, peripheral vascular disease, intermittent claudication.  ED COURSE  Ultrasound was negative for DVT. X-ray was negative for acute findings including joint effusion. He was placed in a knee immobilizer and advised to follow up with orthopedics. He was prescribed naprosyn and zanaflex. He is to return to the ER for symptoms that change or worsen if unable to schedule an appointment. ____________________________________________   FINAL CLINICAL IMPRESSION(S) / ED DIAGNOSES  Final diagnoses:  Acute pain of right knee     ED Discharge Orders         Ordered    naproxen (NAPROSYN) 250 MG tablet  2 times daily with meals     08/17/18 1658    tizanidine (ZANAFLEX) 2 MG capsule  2 times daily PRN     08/17/18 1658           Note:  This document was prepared using Dragon voice recognition software and may include unintentional dictation errors.   Victorino Dike, FNP 08/17/18 2030    Nena Polio, MD 08/17/18 2138

## 2018-08-17 NOTE — ED Triage Notes (Signed)
Pt c/o right knee pain that started bothering him on Thursday, states he tries to walk at least 2 miles/day. Denies any injury.

## 2018-08-27 DIAGNOSIS — M25561 Pain in right knee: Secondary | ICD-10-CM | POA: Diagnosis not present

## 2018-08-27 DIAGNOSIS — M1711 Unilateral primary osteoarthritis, right knee: Secondary | ICD-10-CM | POA: Diagnosis not present

## 2018-09-01 DIAGNOSIS — I10 Essential (primary) hypertension: Secondary | ICD-10-CM | POA: Diagnosis not present

## 2018-09-01 DIAGNOSIS — H2511 Age-related nuclear cataract, right eye: Secondary | ICD-10-CM | POA: Diagnosis not present

## 2018-09-02 DIAGNOSIS — I10 Essential (primary) hypertension: Secondary | ICD-10-CM | POA: Diagnosis not present

## 2018-09-02 DIAGNOSIS — I2510 Atherosclerotic heart disease of native coronary artery without angina pectoris: Secondary | ICD-10-CM | POA: Diagnosis not present

## 2018-09-02 DIAGNOSIS — N400 Enlarged prostate without lower urinary tract symptoms: Secondary | ICD-10-CM | POA: Diagnosis not present

## 2018-09-03 ENCOUNTER — Ambulatory Visit: Payer: PPO | Admitting: Interventional Cardiology

## 2018-09-15 ENCOUNTER — Other Ambulatory Visit: Payer: Self-pay

## 2018-09-15 ENCOUNTER — Encounter: Payer: Self-pay | Admitting: *Deleted

## 2018-09-15 DIAGNOSIS — G720 Drug-induced myopathy: Secondary | ICD-10-CM | POA: Diagnosis not present

## 2018-09-15 DIAGNOSIS — I10 Essential (primary) hypertension: Secondary | ICD-10-CM | POA: Diagnosis not present

## 2018-09-15 DIAGNOSIS — N400 Enlarged prostate without lower urinary tract symptoms: Secondary | ICD-10-CM | POA: Diagnosis not present

## 2018-09-15 DIAGNOSIS — I2510 Atherosclerotic heart disease of native coronary artery without angina pectoris: Secondary | ICD-10-CM | POA: Diagnosis not present

## 2018-09-15 DIAGNOSIS — K219 Gastro-esophageal reflux disease without esophagitis: Secondary | ICD-10-CM | POA: Diagnosis not present

## 2018-09-15 DIAGNOSIS — E7800 Pure hypercholesterolemia, unspecified: Secondary | ICD-10-CM | POA: Diagnosis not present

## 2018-09-18 NOTE — Discharge Instructions (Signed)
General Anesthesia, Adult, Care After °This sheet gives you information about how to care for yourself after your procedure. Your health care provider may also give you more specific instructions. If you have problems or questions, contact your health care provider. °What can I expect after the procedure? °After the procedure, the following side effects are common: °· Pain or discomfort at the IV site. °· Nausea. °· Vomiting. °· Sore throat. °· Trouble concentrating. °· Feeling cold or chills. °· Weak or tired. °· Sleepiness and fatigue. °· Soreness and body aches. These side effects can affect parts of the body that were not involved in surgery. °Follow these instructions at home: ° °For at least 24 hours after the procedure: °· Have a responsible adult stay with you. It is important to have someone help care for you until you are awake and alert. °· Rest as needed. °· Do not: °? Participate in activities in which you could fall or become injured. °? Drive. °? Use heavy machinery. °? Drink alcohol. °? Take sleeping pills or medicines that cause drowsiness. °? Make important decisions or sign legal documents. °? Take care of children on your own. °Eating and drinking °· Follow any instructions from your health care provider about eating or drinking restrictions. °· When you feel hungry, start by eating small amounts of foods that are soft and easy to digest (bland), such as toast. Gradually return to your regular diet. °· Drink enough fluid to keep your urine pale yellow. °· If you vomit, rehydrate by drinking water, juice, or clear broth. °General instructions °· If you have sleep apnea, surgery and certain medicines can increase your risk for breathing problems. Follow instructions from your health care provider about wearing your sleep device: °? Anytime you are sleeping, including during daytime naps. °? While taking prescription pain medicines, sleeping medicines, or medicines that make you drowsy. °· Return to  your normal activities as told by your health care provider. Ask your health care provider what activities are safe for you. °· Take over-the-counter and prescription medicines only as told by your health care provider. °· If you smoke, do not smoke without supervision. °· Keep all follow-up visits as told by your health care provider. This is important. °Contact a health care provider if: °· You have nausea or vomiting that does not get better with medicine. °· You cannot eat or drink without vomiting. °· You have pain that does not get better with medicine. °· You are unable to pass urine. °· You develop a skin rash. °· You have a fever. °· You have redness around your IV site that gets worse. °Get help right away if: °· You have difficulty breathing. °· You have chest pain. °· You have blood in your urine or stool, or you vomit blood. °Summary °· After the procedure, it is common to have a sore throat or nausea. It is also common to feel tired. °· Have a responsible adult stay with you for the first 24 hours after general anesthesia. It is important to have someone help care for you until you are awake and alert. °· When you feel hungry, start by eating small amounts of foods that are soft and easy to digest (bland), such as toast. Gradually return to your regular diet. °· Drink enough fluid to keep your urine pale yellow. °· Return to your normal activities as told by your health care provider. Ask your health care provider what activities are safe for you. °This information is not   intended to replace advice given to you by your health care provider. Make sure you discuss any questions you have with your health care provider. °Document Released: 04/09/2000 Document Revised: 01/04/2017 Document Reviewed: 08/17/2016 °Elsevier Patient Education © 2020 [employer] °Cataract Surgery, Care After °This sheet gives you information about how to care for yourself after your procedure. Your health care provider may also  give you more specific instructions. If you have problems or questions, contact your health care provider. °What can I expect after the procedure? °After the procedure, it is common to have: °· Itching. °· Discomfort. °· Fluid discharge. °· Sensitivity to light and to touch. °· Bruising in or around the eye. °· Mild blurred vision. °Follow these instructions at home: °Eye care ° °· Do not touch or rub your eyes. °· Protect your eyes as told by your health care provider. You may be told to wear a protective eye shield or sunglasses. °· Do not put a contact lens into the affected eye or eyes until your health care provider approves. °· Keep the area around your eye clean and dry: °? Avoid swimming. °? Do not allow water to hit you directly in the face while showering. °? Keep soap and shampoo out of your eyes. °· Check your eye every day for signs of infection. Watch for: °? Redness, swelling, or pain. °? Fluid, blood, or pus. °? Warmth. °? A bad smell. °? Vision that is getting worse. °? Sensitivity that is getting worse. °Activity °· Do not drive for 24 hours if you were given a sedative during your procedure. °· Avoid strenuous activities, such as playing contact sports, for as long as told by your health care provider. °· Do not drive or use heavy machinery until your health care provider approves. °· Do not bend or lift heavy objects. Bending increases pressure in the eye. You can walk, climb stairs, and do light household chores. °· Ask your health care provider when you can return to work. If you work in a dusty environment, you may be advised to wear protective eyewear for a period of time. °General instructions °· Take or apply over-the-counter and prescription medicines only as told by your health care provider. This includes eye drops. °· Keep all follow-up visits as told by your health care provider. This is important. °Contact a health care provider if: °· You have increased bruising around your  eye. °· You have pain that is not helped with medicine. °· You have a fever. °· You have redness, swelling, or pain in your eye. °· You have fluid, blood, or pus coming from your incision. °· Your vision gets worse. °· Your sensitivity to light gets worse. °Get help right away if: °· You have sudden loss of vision. °· You see flashes of light or spots (floaters). °· You have severe eye pain. °· You develop nausea or vomiting. °Summary °· After your procedure, it is common to have itching, discomfort, bruising, fluid discharge, or sensitivity to light. °· Follow instructions from your health care provider about caring for your eye after the procedure. °· Do not rub your eye after the procedure. You may need to wear eye protection or sunglasses. Do not wear contact lenses. Keep the area around your eye clean and dry. °· Avoid activities that require a lot of effort. These include playing sports and lifting heavy objects. °· Contact a health care provider if you have increased bruising, pain that does not go away, or a fever. Get   help right away if you suddenly lose your vision, see flashes of light or spots, or have severe pain in the eye. °This information is not intended to replace advice given to you by your health care provider. Make sure you discuss any questions you have with your health care provider. °Document Released: 07/21/2004 Document Revised: 07/01/2017 Document Reviewed: 07/01/2017 °Elsevier Patient Education © 2020 [employer] ° °

## 2018-09-19 ENCOUNTER — Other Ambulatory Visit: Payer: Self-pay

## 2018-09-19 ENCOUNTER — Other Ambulatory Visit
Admission: RE | Admit: 2018-09-19 | Discharge: 2018-09-19 | Disposition: A | Discharge status: Home / Self Care | Payer: PPO | Source: Ambulatory Visit | Attending: Ophthalmology | Admitting: Ophthalmology

## 2018-09-19 DIAGNOSIS — Z01812 Encounter for preprocedural laboratory examination: Secondary | ICD-10-CM | POA: Insufficient documentation

## 2018-09-19 DIAGNOSIS — Z20828 Contact with and (suspected) exposure to other viral communicable diseases: Secondary | ICD-10-CM | POA: Insufficient documentation

## 2018-09-19 LAB — SARS CORONAVIRUS 2 (TAT 6-24 HRS): SARS Coronavirus 2: NEGATIVE

## 2018-09-23 NOTE — Anesthesia Preprocedure Evaluation (Addendum)
Anesthesia Evaluation  Patient identified by MRN, date of birth, ID band Patient awake    Reviewed: Allergy & Precautions, NPO status , Patient's Chart, lab work & pertinent test results  History of Anesthesia Complications (+) DIFFICULT AIRWAY and history of anesthetic complications (difficult airway and intubation; allergy to succinylcholine)  Airway Mallampati: IV   Neck ROM: Full    Dental no notable dental hx.    Pulmonary neg pulmonary ROS,    Pulmonary exam normal breath sounds clear to auscultation       Cardiovascular hypertension, + CAD (s/p stents)  + Valvular Problems/Murmurs (murmur)  Rhythm:Regular Rate:Normal + Systolic murmurs ECG 07/19/08: 1st degree AVB   Neuro/Psych negative neurological ROS     GI/Hepatic negative GI ROS,   Endo/Other  negative endocrine ROS  Renal/GU Renal disease (hx nephrolithiasis)     Musculoskeletal   Abdominal   Peds  Hematology negative hematology ROS (+)   Anesthesia Other Findings Cardiology note 07/23/18:  ASSESSMENT:  1. Coronary artery disease involving native coronary artery of native heart without angina pectoris  2. Nonrheumatic aortic valve stenosis  3. Essential hypertension  4. Mixed hyperlipidemia   PLAN:  In order of problems listed above: 1. Stable.  Aggressive lipid-lowering with Repatha.  Most recent LDL was 72.  Secondary prevention discussed. 2. Mild aortic stenosis, 2018.  With exertional dyspnea, will repeat echo cardiogram. 3. Target blood pressure 130/80 mmHg.  Low-salt diet discussed. 4. LDL target less than 70.   Reproductive/Obstetrics                            Anesthesia Physical Anesthesia Plan  ASA: III  Anesthesia Plan: MAC   Post-op Pain Management:    Induction: Intravenous  PONV Risk Score and Plan: 1 and TIVA and Midazolam  Airway Management Planned: Natural Airway  Additional Equipment:    Intra-op Plan:   Post-operative Plan:   Informed Consent: I have reviewed the patients History and Physical, chart, labs and discussed the procedure including the risks, benefits and alternatives for the proposed anesthesia with the patient or authorized representative who has indicated his/her understanding and acceptance.       Plan Discussed with: CRNA  Anesthesia Plan Comments: (Wears med alert necklace stating pt is difficult airway and intubation, and allergic to succinylcholine.)       Anesthesia Quick Evaluation

## 2018-09-24 ENCOUNTER — Other Ambulatory Visit: Payer: Self-pay

## 2018-09-24 ENCOUNTER — Ambulatory Visit: Payer: PPO | Admitting: Anesthesiology

## 2018-09-24 ENCOUNTER — Ambulatory Visit
Admission: RE | Admit: 2018-09-24 | Discharge: 2018-09-24 | Disposition: A | Discharge status: Home / Self Care | Payer: PPO | Attending: Ophthalmology | Admitting: Ophthalmology

## 2018-09-24 ENCOUNTER — Encounter
Admission: RE | Disposition: A | Discharge status: Home / Self Care | Payer: Self-pay | Source: Home / Self Care | Attending: Ophthalmology

## 2018-09-24 DIAGNOSIS — H2511 Age-related nuclear cataract, right eye: Secondary | ICD-10-CM | POA: Insufficient documentation

## 2018-09-24 DIAGNOSIS — M1990 Unspecified osteoarthritis, unspecified site: Secondary | ICD-10-CM | POA: Diagnosis not present

## 2018-09-24 DIAGNOSIS — Z7982 Long term (current) use of aspirin: Secondary | ICD-10-CM | POA: Diagnosis not present

## 2018-09-24 DIAGNOSIS — I10 Essential (primary) hypertension: Secondary | ICD-10-CM | POA: Diagnosis not present

## 2018-09-24 DIAGNOSIS — Z882 Allergy status to sulfonamides status: Secondary | ICD-10-CM | POA: Insufficient documentation

## 2018-09-24 DIAGNOSIS — H25811 Combined forms of age-related cataract, right eye: Secondary | ICD-10-CM | POA: Diagnosis not present

## 2018-09-24 DIAGNOSIS — Z79899 Other long term (current) drug therapy: Secondary | ICD-10-CM | POA: Diagnosis not present

## 2018-09-24 DIAGNOSIS — Z955 Presence of coronary angioplasty implant and graft: Secondary | ICD-10-CM | POA: Insufficient documentation

## 2018-09-24 DIAGNOSIS — I2510 Atherosclerotic heart disease of native coronary artery without angina pectoris: Secondary | ICD-10-CM | POA: Diagnosis not present

## 2018-09-24 DIAGNOSIS — Z791 Long term (current) use of non-steroidal anti-inflammatories (NSAID): Secondary | ICD-10-CM | POA: Insufficient documentation

## 2018-09-24 HISTORY — PX: CATARACT EXTRACTION W/PHACO: SHX586

## 2018-09-24 HISTORY — DX: Other complications of anesthesia, initial encounter: T88.59XA

## 2018-09-24 HISTORY — DX: Unspecified osteoarthritis, unspecified site: M19.90

## 2018-09-24 SURGERY — PHACOEMULSIFICATION, CATARACT, WITH IOL INSERTION
Anesthesia: Monitor Anesthesia Care | Site: Eye | Laterality: Right

## 2018-09-24 MED ORDER — ONDANSETRON HCL 4 MG/2ML IJ SOLN: 4.0000 mg | Freq: Once | INTRAMUSCULAR | Status: DC | PRN

## 2018-09-24 MED ORDER — NA HYALUR & NA CHOND-NA HYALUR 0.4-0.35 ML IO KIT
PACK | INTRAOCULAR | Status: DC | PRN
  Administered 2018-09-24: 1 mL via INTRAOCULAR

## 2018-09-24 MED ORDER — EPINEPHRINE PF 1 MG/ML IJ SOLN
INTRAOCULAR | Status: DC | PRN
  Administered 2018-09-24: 73 mL via OPHTHALMIC

## 2018-09-24 MED ORDER — BRIMONIDINE TARTRATE-TIMOLOL 0.2-0.5 % OP SOLN
OPHTHALMIC | Status: DC | PRN
  Administered 2018-09-24: 1 [drp] via OPHTHALMIC

## 2018-09-24 MED ORDER — CEFUROXIME OPHTHALMIC INJECTION 1 MG/0.1 ML
INJECTION | OPHTHALMIC | Status: DC | PRN
  Administered 2018-09-24: 0.1 mL via INTRACAMERAL

## 2018-09-24 MED ORDER — ACETAMINOPHEN 325 MG PO TABS: 650.0000 mg | ORAL_TABLET | Freq: Once | ORAL | Status: DC | PRN

## 2018-09-24 MED ORDER — MOXIFLOXACIN HCL 0.5 % OP SOLN
1.0000 [drp] | OPHTHALMIC | Status: DC | PRN
  Administered 2018-09-24 (×3): 1 [drp] via OPHTHALMIC

## 2018-09-24 MED ORDER — FENTANYL CITRATE (PF) 100 MCG/2ML IJ SOLN
INTRAMUSCULAR | Status: DC | PRN
  Administered 2018-09-24 (×2): 50 ug via INTRAVENOUS

## 2018-09-24 MED ORDER — LIDOCAINE HCL (PF) 2 % IJ SOLN
INTRAOCULAR | Status: DC | PRN
  Administered 2018-09-24: 2 mL

## 2018-09-24 MED ORDER — MIDAZOLAM HCL 2 MG/2ML IJ SOLN
INTRAMUSCULAR | Status: DC | PRN
  Administered 2018-09-24 (×2): 1 mg via INTRAVENOUS

## 2018-09-24 MED ORDER — TETRACAINE HCL 0.5 % OP SOLN
1.0000 [drp] | OPHTHALMIC | Status: DC | PRN
  Administered 2018-09-24 (×3): 1 [drp] via OPHTHALMIC

## 2018-09-24 MED ORDER — ARMC OPHTHALMIC DILATING DROPS
1.0000 "application " | OPHTHALMIC | Status: DC | PRN
  Administered 2018-09-24 (×3): 1 via OPHTHALMIC

## 2018-09-24 MED ORDER — ACETAMINOPHEN 160 MG/5ML PO SOLN: 325.0000 mg | ORAL | Status: DC | PRN

## 2018-09-24 SURGICAL SUPPLY — 19 items
CANNULA ANT/CHMB 27G (MISCELLANEOUS) ×1 IMPLANT
CANNULA ANT/CHMB 27GA (MISCELLANEOUS) ×2 IMPLANT
GLOVE SURG LX 7.5 STRW (GLOVE) ×1
GLOVE SURG LX STRL 7.5 STRW (GLOVE) ×1 IMPLANT
GLOVE SURG TRIUMPH 8.0 PF LTX (GLOVE) ×2 IMPLANT
GOWN STRL REUS W/ TWL LRG LVL3 (GOWN DISPOSABLE) ×2 IMPLANT
GOWN STRL REUS W/TWL LRG LVL3 (GOWN DISPOSABLE) ×4
LENS IOL TECNIS ITEC 20.5 (Intraocular Lens) ×1 IMPLANT
MARKER SKIN DUAL TIP RULER LAB (MISCELLANEOUS) ×2 IMPLANT
NDL FILTER BLUNT 18X1 1/2 (NEEDLE) IMPLANT
NEEDLE FILTER BLUNT 18X 1/2SAF (NEEDLE) ×2
NEEDLE FILTER BLUNT 18X1 1/2 (NEEDLE) ×2 IMPLANT
PACK CATARACT BRASINGTON (MISCELLANEOUS) ×2 IMPLANT
PACK EYE AFTER SURG (MISCELLANEOUS) ×2 IMPLANT
PACK OPTHALMIC (MISCELLANEOUS) ×2 IMPLANT
SYR 3ML LL SCALE MARK (SYRINGE) ×3 IMPLANT
SYR TB 1ML LUER SLIP (SYRINGE) ×2 IMPLANT
WATER STERILE IRR 500ML POUR (IV SOLUTION) ×2 IMPLANT
WIPE NON LINTING 3.25X3.25 (MISCELLANEOUS) ×2 IMPLANT

## 2018-09-24 NOTE — Anesthesia Postprocedure Evaluation (Signed)
Anesthesia Post Note  Patient: Jason Blackwell  Procedure(s) Performed: CATARACT EXTRACTION PHACO AND INTRAOCULAR LENS PLACEMENT (IOC)COMPLICATED RIGHT (Right Eye)  Patient location during evaluation: PACU Anesthesia Type: MAC Level of consciousness: awake and alert, oriented and patient cooperative Pain management: pain level controlled Vital Signs Assessment: post-procedure vital signs reviewed and stable Respiratory status: spontaneous breathing, nonlabored ventilation and respiratory function stable Cardiovascular status: blood pressure returned to baseline and stable Postop Assessment: adequate PO intake Anesthetic complications: no    Darrin Nipper

## 2018-09-24 NOTE — Transfer of Care (Signed)
Immediate Anesthesia Transfer of Care Note  Patient: Jason Blackwell  Procedure(s) Performed: CATARACT EXTRACTION PHACO AND INTRAOCULAR LENS PLACEMENT (IOC)COMPLICATED RIGHT (Right Eye)  Patient Location: PACU  Anesthesia Type: MAC  Level of Consciousness: awake, alert  and patient cooperative  Airway and Oxygen Therapy: Patient Spontanous Breathing and Patient connected to supplemental oxygen  Post-op Assessment: Post-op Vital signs reviewed, Patient's Cardiovascular Status Stable, Respiratory Function Stable, Patent Airway and No signs of Nausea or vomiting  Post-op Vital Signs: Reviewed and stable  Complications: No apparent anesthesia complications

## 2018-09-24 NOTE — Anesthesia Procedure Notes (Signed)
Procedure Name: MAC Date/Time: 09/24/2018 8:59 AM Performed by: Georga Bora, CRNA Pre-anesthesia Checklist: Patient identified, Emergency Drugs available, Suction available, Patient being monitored and Timeout performed Patient Re-evaluated:Patient Re-evaluated prior to induction Oxygen Delivery Method: Nasal cannula

## 2018-09-24 NOTE — Op Note (Signed)
LOCATION:  [place]   PREOPERATIVE DIAGNOSIS:    Nuclear sclerotic cataract right eye. H25.11   POSTOPERATIVE DIAGNOSIS:  Nuclear sclerotic cataract right eye.     PROCEDURE:  Phacoemusification with posterior chamber intraocular lens placement of the right eye   LENS:   Implant Name Type Inv. Item Serial No. Manufacturer Lot No. LRB No. Used Action  LENS IOL DIOP 20.5 - [id number] Intraocular Lens LENS IOL DIOP 20.5 [id number]  Right 1 Implanted      ULTRASOUND TIME: 19 % of 1 minutes, 20 seconds.  CDE 15.2   SURGEON:  Wyonia Hough, MD   ANESTHESIA:  Topical with tetracaine drops and 2% Xylocaine jelly, augmented with 1% preservative-free intracameral lidocaine.    COMPLICATIONS:  None.   DESCRIPTION OF PROCEDURE:  The patient was identified in the holding room and transported to the operating room and placed in the supine position under the operating microscope.  The right eye was identified as the operative eye and it was prepped and draped in the usual sterile ophthalmic fashion.   A 1 millimeter clear-corneal paracentesis was made at the 12:00 position.  0.5 ml of preservative-free 1% lidocaine was injected into the anterior chamber. The anterior chamber was filled with Viscoat viscoelastic.  A 2.4 millimeter keratome was used to make a near-clear corneal incision at the 9:00 position.  A curvilinear capsulorrhexis was made with a cystotome and capsulorrhexis forceps.  Balanced salt solution was used to hydrodissect and hydrodelineate the nucleus.   Phacoemulsification was then used in stop and chop fashion to remove the lens nucleus and epinucleus.  The remaining cortex was then removed using the irrigation and aspiration handpiece. Provisc was then placed into the capsular bag to distend it for lens placement.  A lens was then injected into the capsular bag.  The remaining viscoelastic was aspirated.   Wounds were hydrated with balanced salt solution.   The anterior chamber was inflated to a physiologic pressure with balanced salt solution.  No wound leaks were noted. Cefuroxime 0.1 ml of a 10mg /ml solution was injected into the anterior chamber for a dose of 1 mg of intracameral antibiotic at the completion of the case.   Timolol and Brimonidine drops were applied to the eye.  The patient was taken to the recovery room in stable condition without complications of anesthesia or surgery.   Maybree Riling 09/24/2018, 9:14 AM

## 2018-09-24 NOTE — H&P (Signed)

## 2018-09-25 ENCOUNTER — Encounter: Payer: Self-pay | Admitting: Ophthalmology

## 2018-10-06 DIAGNOSIS — I10 Essential (primary) hypertension: Secondary | ICD-10-CM | POA: Diagnosis not present

## 2018-10-06 DIAGNOSIS — H2512 Age-related nuclear cataract, left eye: Secondary | ICD-10-CM | POA: Diagnosis not present

## 2018-10-08 NOTE — Anesthesia Preprocedure Evaluation (Addendum)
Anesthesia Evaluation  Patient identified by MRN, date of birth, ID band Patient awake    Reviewed: Allergy & Precautions, H&P , NPO status , Patient's Chart, lab work & pertinent test results  History of Anesthesia Complications (+) DIFFICULT AIRWAY and history of anesthetic complications (difficult airway and intubation; allergy to succinylcholine)  Airway Mallampati: IV  TM Distance: >3 FB Neck ROM: Full    Dental no notable dental hx.    Pulmonary neg pulmonary ROS,    Pulmonary exam normal breath sounds clear to auscultation       Cardiovascular hypertension, + CAD (s/p stents)  Normal cardiovascular exam+ Valvular Problems/Murmurs (murmur)  Rhythm:regular Rate:Normal + Systolic murmurs ECG 03/20/60: 1st degree AVB   Neuro/Psych negative neurological ROS     GI/Hepatic negative GI ROS,   Endo/Other  negative endocrine ROS  Renal/GU Renal disease (hx nephrolithiasis)     Musculoskeletal   Abdominal   Peds  Hematology negative hematology ROS (+)   Anesthesia Other Findings Cardiology note 07/23/18:  ASSESSMENT:  1. Coronary artery disease involving native coronary artery of native heart without angina pectoris  2. Nonrheumatic aortic valve stenosis  3. Essential hypertension  4. Mixed hyperlipidemia   PLAN:  In order of problems listed above: 1. Stable.  Aggressive lipid-lowering with Repatha.  Most recent LDL was 72.  Secondary prevention discussed. 2. Mild aortic stenosis, 2018.  With exertional dyspnea, will repeat echo cardiogram. 3. Target blood pressure 130/80 mmHg.  Low-salt diet discussed. 4. LDL target less than 70.   Reproductive/Obstetrics                            Anesthesia Physical  Anesthesia Plan  ASA: III  Anesthesia Plan: MAC   Post-op Pain Management:    Induction: Intravenous  PONV Risk Score and Plan: 1 and TIVA, Midazolam and Treatment may vary due to  age or medical condition  Airway Management Planned: Natural Airway  Additional Equipment:   Intra-op Plan:   Post-operative Plan:   Informed Consent: I have reviewed the patients History and Physical, chart, labs and discussed the procedure including the risks, benefits and alternatives for the proposed anesthesia with the patient or authorized representative who has indicated his/her understanding and acceptance.       Plan Discussed with: CRNA  Anesthesia Plan Comments: (Wears med alert necklace stating pt is difficult airway and intubation, and allergic to succinylcholine.)       Anesthesia Quick Evaluation                                  Anesthesia Evaluation  Patient identified by MRN, date of birth, ID band Patient awake    Reviewed: Allergy & Precautions, NPO status , Patient's Chart, lab work & pertinent test results  History of Anesthesia Complications (+) DIFFICULT AIRWAY and history of anesthetic complications (difficult airway and intubation; allergy to succinylcholine)  Airway Mallampati: IV   Neck ROM: Full    Dental no notable dental hx.    Pulmonary neg pulmonary ROS,    Pulmonary exam normal breath sounds clear to auscultation       Cardiovascular hypertension, + CAD (s/p stents)  + Valvular Problems/Murmurs (murmur)  Rhythm:Regular Rate:Normal + Systolic murmurs ECG 09/19/74: 1st degree AVB   Neuro/Psych negative neurological ROS     GI/Hepatic negative GI ROS,   Endo/Other  negative endocrine ROS  Renal/GU Renal disease (hx nephrolithiasis)     Musculoskeletal   Abdominal   Peds  Hematology negative hematology ROS (+)   Anesthesia Other Findings Cardiology note 07/23/18:  ASSESSMENT:  1. Coronary artery disease involving native coronary artery of native heart without angina pectoris  2. Nonrheumatic aortic valve stenosis  3. Essential hypertension  4. Mixed hyperlipidemia   PLAN:  In order of problems listed  above: 1. Stable.  Aggressive lipid-lowering with Repatha.  Most recent LDL was 72.  Secondary prevention discussed. 2. Mild aortic stenosis, 2018.  With exertional dyspnea, will repeat echo cardiogram. 3. Target blood pressure 130/80 mmHg.  Low-salt diet discussed. 4. LDL target less than 70.   Reproductive/Obstetrics                            Anesthesia Physical Anesthesia Plan  ASA: III  Anesthesia Plan: MAC   Post-op Pain Management:    Induction: Intravenous  PONV Risk Score and Plan: 1 and TIVA and Midazolam  Airway Management Planned: Natural Airway  Additional Equipment:   Intra-op Plan:   Post-operative Plan:   Informed Consent: I have reviewed the patients History and Physical, chart, labs and discussed the procedure including the risks, benefits and alternatives for the proposed anesthesia with the patient or authorized representative who has indicated his/her understanding and acceptance.       Plan Discussed with: CRNA  Anesthesia Plan Comments: (Wears med alert necklace stating pt is difficult airway and intubation, and allergic to succinylcholine.)       Anesthesia Quick Evaluation

## 2018-10-10 ENCOUNTER — Other Ambulatory Visit
Admission: RE | Admit: 2018-10-10 | Discharge: 2018-10-10 | Disposition: A | Discharge status: Home / Self Care | Payer: PPO | Source: Ambulatory Visit | Attending: Ophthalmology | Admitting: Ophthalmology

## 2018-10-10 DIAGNOSIS — Z01812 Encounter for preprocedural laboratory examination: Secondary | ICD-10-CM | POA: Insufficient documentation

## 2018-10-10 DIAGNOSIS — Z20828 Contact with and (suspected) exposure to other viral communicable diseases: Secondary | ICD-10-CM | POA: Diagnosis not present

## 2018-10-11 LAB — SARS CORONAVIRUS 2 (TAT 6-24 HRS): SARS Coronavirus 2: NEGATIVE

## 2018-10-14 NOTE — Discharge Instructions (Signed)

## 2018-10-15 ENCOUNTER — Ambulatory Visit
Admission: RE | Admit: 2018-10-15 | Discharge: 2018-10-15 | Disposition: A | Discharge status: Home / Self Care | Payer: PPO | Attending: Ophthalmology | Admitting: Ophthalmology

## 2018-10-15 ENCOUNTER — Encounter
Admission: RE | Disposition: A | Discharge status: Home / Self Care | Payer: Self-pay | Source: Home / Self Care | Attending: Ophthalmology

## 2018-10-15 ENCOUNTER — Other Ambulatory Visit: Payer: Self-pay

## 2018-10-15 ENCOUNTER — Ambulatory Visit: Payer: PPO | Admitting: Anesthesiology

## 2018-10-15 DIAGNOSIS — H25812 Combined forms of age-related cataract, left eye: Secondary | ICD-10-CM | POA: Diagnosis not present

## 2018-10-15 DIAGNOSIS — Z7982 Long term (current) use of aspirin: Secondary | ICD-10-CM | POA: Insufficient documentation

## 2018-10-15 DIAGNOSIS — E7800 Pure hypercholesterolemia, unspecified: Secondary | ICD-10-CM | POA: Insufficient documentation

## 2018-10-15 DIAGNOSIS — Z79899 Other long term (current) drug therapy: Secondary | ICD-10-CM | POA: Insufficient documentation

## 2018-10-15 DIAGNOSIS — H2512 Age-related nuclear cataract, left eye: Secondary | ICD-10-CM | POA: Diagnosis not present

## 2018-10-15 DIAGNOSIS — I2510 Atherosclerotic heart disease of native coronary artery without angina pectoris: Secondary | ICD-10-CM | POA: Insufficient documentation

## 2018-10-15 DIAGNOSIS — I10 Essential (primary) hypertension: Secondary | ICD-10-CM | POA: Diagnosis not present

## 2018-10-15 DIAGNOSIS — Z955 Presence of coronary angioplasty implant and graft: Secondary | ICD-10-CM | POA: Insufficient documentation

## 2018-10-15 DIAGNOSIS — E782 Mixed hyperlipidemia: Secondary | ICD-10-CM | POA: Diagnosis not present

## 2018-10-15 DIAGNOSIS — Z791 Long term (current) use of non-steroidal anti-inflammatories (NSAID): Secondary | ICD-10-CM | POA: Diagnosis not present

## 2018-10-15 HISTORY — PX: CATARACT EXTRACTION W/PHACO: SHX586

## 2018-10-15 SURGERY — PHACOEMULSIFICATION, CATARACT, WITH IOL INSERTION
Anesthesia: Monitor Anesthesia Care | Site: Eye | Laterality: Left

## 2018-10-15 MED ORDER — FENTANYL CITRATE (PF) 100 MCG/2ML IJ SOLN
INTRAMUSCULAR | Status: DC | PRN
  Administered 2018-10-15: 100 ug via INTRAVENOUS

## 2018-10-15 MED ORDER — CEFUROXIME OPHTHALMIC INJECTION 1 MG/0.1 ML
INJECTION | OPHTHALMIC | Status: DC | PRN
  Administered 2018-10-15: 0.1 mL via INTRACAMERAL

## 2018-10-15 MED ORDER — LACTATED RINGERS IV SOLN: INTRAVENOUS | Status: DC

## 2018-10-15 MED ORDER — ACETAMINOPHEN 325 MG PO TABS: 325.0000 mg | ORAL_TABLET | Freq: Once | ORAL | Status: DC

## 2018-10-15 MED ORDER — MIDAZOLAM HCL 2 MG/2ML IJ SOLN
INTRAMUSCULAR | Status: DC | PRN
  Administered 2018-10-15: 2 mg via INTRAVENOUS

## 2018-10-15 MED ORDER — MOXIFLOXACIN HCL 0.5 % OP SOLN
1.0000 [drp] | OPHTHALMIC | Status: DC | PRN
  Administered 2018-10-15 (×3): 1 [drp] via OPHTHALMIC

## 2018-10-15 MED ORDER — BRIMONIDINE TARTRATE-TIMOLOL 0.2-0.5 % OP SOLN
OPHTHALMIC | Status: DC | PRN
  Administered 2018-10-15: 1 [drp] via OPHTHALMIC

## 2018-10-15 MED ORDER — LIDOCAINE HCL (PF) 2 % IJ SOLN
INTRAOCULAR | Status: DC | PRN
  Administered 2018-10-15: 1 mL

## 2018-10-15 MED ORDER — ARMC OPHTHALMIC DILATING DROPS
1.0000 "application " | OPHTHALMIC | Status: DC | PRN
  Administered 2018-10-15 (×3): 1 via OPHTHALMIC

## 2018-10-15 MED ORDER — TETRACAINE HCL 0.5 % OP SOLN
1.0000 [drp] | OPHTHALMIC | Status: DC | PRN
  Administered 2018-10-15 (×3): 1 [drp] via OPHTHALMIC

## 2018-10-15 MED ORDER — ACETAMINOPHEN 160 MG/5ML PO SOLN: 325.0000 mg | Freq: Once | ORAL | Status: DC

## 2018-10-15 MED ORDER — NA HYALUR & NA CHOND-NA HYALUR 0.4-0.35 ML IO KIT
PACK | INTRAOCULAR | Status: DC | PRN
  Administered 2018-10-15: 1 mL via INTRAOCULAR

## 2018-10-15 MED ORDER — EPINEPHRINE PF 1 MG/ML IJ SOLN
INTRAOCULAR | Status: DC | PRN
  Administered 2018-10-15: 11:00:00 70 mL via OPHTHALMIC

## 2018-10-15 SURGICAL SUPPLY — 16 items
CANNULA ANT/CHMB 27G (MISCELLANEOUS) ×1 IMPLANT
CANNULA ANT/CHMB 27GA (MISCELLANEOUS) ×2 IMPLANT
GLOVE SURG LX 7.5 STRW (GLOVE) ×1
GLOVE SURG LX STRL 7.5 STRW (GLOVE) ×1 IMPLANT
GLOVE SURG TRIUMPH 8.0 PF LTX (GLOVE) ×2 IMPLANT
GOWN STRL REUS W/ TWL LRG LVL3 (GOWN DISPOSABLE) ×2 IMPLANT
GOWN STRL REUS W/TWL LRG LVL3 (GOWN DISPOSABLE) ×4
LENS IOL TECNIS ITEC 20.0 (Intraocular Lens) ×1 IMPLANT
MARKER SKIN DUAL TIP RULER LAB (MISCELLANEOUS) ×2 IMPLANT
PACK CATARACT BRASINGTON (MISCELLANEOUS) ×2 IMPLANT
PACK EYE AFTER SURG (MISCELLANEOUS) ×2 IMPLANT
PACK OPTHALMIC (MISCELLANEOUS) ×2 IMPLANT
SYR 3ML LL SCALE MARK (SYRINGE) ×2 IMPLANT
SYR TB 1ML LUER SLIP (SYRINGE) ×2 IMPLANT
WATER STERILE IRR 500ML POUR (IV SOLUTION) ×2 IMPLANT
WIPE NON LINTING 3.25X3.25 (MISCELLANEOUS) ×2 IMPLANT

## 2018-10-15 NOTE — Anesthesia Postprocedure Evaluation (Signed)
Anesthesia Post Note  Patient: Jason Blackwell  Procedure(s) Performed: CATARACT EXTRACTION PHACO AND INTRAOCULAR LENS PLACEMENT (IOC) RIGHT  01:16.3  17.9%  13.79 (Left Eye)  Patient location during evaluation: PACU Anesthesia Type: MAC Level of consciousness: awake and alert and oriented Pain management: satisfactory to patient Vital Signs Assessment: post-procedure vital signs reviewed and stable Respiratory status: spontaneous breathing, nonlabored ventilation and respiratory function stable Cardiovascular status: blood pressure returned to baseline and stable Postop Assessment: Adequate PO intake and No signs of nausea or vomiting Anesthetic complications: no    Raliegh Ip

## 2018-10-15 NOTE — Op Note (Signed)
OPERATIVE NOTE  MORAN SIFUENTEZ [id number] 10/15/2018   PREOPERATIVE DIAGNOSIS:  Nuclear sclerotic cataract left eye. H25.12   POSTOPERATIVE DIAGNOSIS:    Nuclear sclerotic cataract left eye.     PROCEDURE:  Phacoemusification with posterior chamber intraocular lens placement of the left eye  Ultrasound time: Procedure(s): CATARACT EXTRACTION PHACO AND INTRAOCULAR LENS PLACEMENT (IOC) RIGHT  01:16.3  17.9%  13.79 (Left)  LENS:   Implant Name Type Inv. Item Serial No. Manufacturer Lot No. LRB No. Used Action  LENS IOL DIOP 20.0 - [id number] Intraocular Lens LENS IOL DIOP 20.0 [id number]  Left 1 Implanted      SURGEON:  Wyonia Hough, MD   ANESTHESIA:  Topical with tetracaine drops and 2% Xylocaine jelly, augmented with 1% preservative-free intracameral lidocaine.    COMPLICATIONS:  None.   DESCRIPTION OF PROCEDURE:  The patient was identified in the holding room and transported to the operating room and placed in the supine position under the operating microscope.  The left eye was identified as the operative eye and it was prepped and draped in the usual sterile ophthalmic fashion.   A 1 millimeter clear-corneal paracentesis was made at the 1:30 position.  0.5 ml of preservative-free 1% lidocaine was injected into the anterior chamber.  The anterior chamber was filled with Viscoat viscoelastic.  A 2.4 millimeter keratome was used to make a near-clear corneal incision at the 10:30 position.  .  A curvilinear capsulorrhexis was made with a cystotome and capsulorrhexis forceps.  Balanced salt solution was used to hydrodissect and hydrodelineate the nucleus.   Phacoemulsification was then used in stop and chop fashion to remove the lens nucleus and epinucleus.  The remaining cortex was then removed using the irrigation and aspiration handpiece. Provisc was then placed into the capsular bag to distend it for lens placement.  A lens was then injected into the capsular bag.   The remaining viscoelastic was aspirated.   Wounds were hydrated with balanced salt solution.  The anterior chamber was inflated to a physiologic pressure with balanced salt solution.  No wound leaks were noted. Cefuroxime 0.1 ml of a 10mg /ml solution was injected into the anterior chamber for a dose of 1 mg of intracameral antibiotic at the completion of the case.   Timolol and Brimonidine drops were applied to the eye.  The patient was taken to the recovery room in stable condition without complications of anesthesia or surgery.  Shuayb Schepers 10/15/2018, 10:33 AM

## 2018-10-15 NOTE — H&P (Signed)

## 2018-10-15 NOTE — Transfer of Care (Signed)
Immediate Anesthesia Transfer of Care Note  Patient: Jason Blackwell  Procedure(s) Performed: CATARACT EXTRACTION PHACO AND INTRAOCULAR LENS PLACEMENT (IOC) RIGHT  01:16.3  17.9%  13.79 (Left Eye)  Patient Location: PACU  Anesthesia Type: MAC  Level of Consciousness: awake, alert  and patient cooperative  Airway and Oxygen Therapy: Patient Spontanous Breathing and Patient connected to supplemental oxygen  Post-op Assessment: Post-op Vital signs reviewed, Patient's Cardiovascular Status Stable, Respiratory Function Stable, Patent Airway and No signs of Nausea or vomiting  Post-op Vital Signs: Reviewed and stable  Complications: No apparent anesthesia complications

## 2018-11-18 ENCOUNTER — Telehealth: Payer: Self-pay | Admitting: Pharmacist

## 2018-11-18 NOTE — Telephone Encounter (Signed)
Patient called about renewing his SNF application. Advised that it is unlikely he will be approved this year since SNF has not been approving patient with insurance. Instead we can apply for the [place] for patient. Will wait until end of  December as patient is still getting free drug from SNF until the end of the year. Will call patient once we apply for him. Can use all the same info SNF application as it has not changed.

## 2018-11-21 DIAGNOSIS — I2510 Atherosclerotic heart disease of native coronary artery without angina pectoris: Secondary | ICD-10-CM | POA: Diagnosis not present

## 2018-11-21 DIAGNOSIS — I10 Essential (primary) hypertension: Secondary | ICD-10-CM | POA: Diagnosis not present

## 2018-11-21 DIAGNOSIS — E7800 Pure hypercholesterolemia, unspecified: Secondary | ICD-10-CM | POA: Diagnosis not present

## 2018-11-21 DIAGNOSIS — N400 Enlarged prostate without lower urinary tract symptoms: Secondary | ICD-10-CM | POA: Diagnosis not present

## 2018-11-24 DIAGNOSIS — M1711 Unilateral primary osteoarthritis, right knee: Secondary | ICD-10-CM | POA: Diagnosis not present

## 2018-11-27 ENCOUNTER — Telehealth: Payer: Self-pay | Admitting: Interventional Cardiology

## 2018-11-27 NOTE — Telephone Encounter (Signed)
°*  STAT* If patient is at the pharmacy, call can be transferred to refill team.   1. Which medications need to be refilled? (please list name of each medication and dose if known) Evolocumab (REPATHA SURECLICK) [id number] MG/ML SOAJ  2. Which pharmacy/location (including street and city if local pharmacy) is medication to be sent to? [facility] [place], [place]  3. Do they need a 30 day or 90 day supply? Patient is asking for 90 day supply..enough to get him into the next year.

## 2018-11-27 NOTE — Telephone Encounter (Signed)
Patient uses SNF- Rx called into them

## 2018-11-27 NOTE — Telephone Encounter (Signed)
Spoke with patient. He states that he is out of refills with the SNF. I advised that we could send a new Rx to them. Patient wished to go ahead a apply for [facility] and [facility].   Does not qualify for [facility] currently bc he has coverage with SNF. I called in Rx to SNF for 3 month supply. They will ship out to patient within the next week. This should last patient till Feb. Advised he call us come Feb when he runs out of Repatha and we will apply for [employer] at that time.

## 2018-11-27 NOTE — Telephone Encounter (Signed)
Patient states that he has new info in regards to his SNF application that Lenna Sciara was helping him with. He is asking for a call back.

## 2019-01-05 DIAGNOSIS — E7800 Pure hypercholesterolemia, unspecified: Secondary | ICD-10-CM | POA: Diagnosis not present

## 2019-01-05 DIAGNOSIS — I2510 Atherosclerotic heart disease of native coronary artery without angina pectoris: Secondary | ICD-10-CM | POA: Diagnosis not present

## 2019-01-05 DIAGNOSIS — I10 Essential (primary) hypertension: Secondary | ICD-10-CM | POA: Diagnosis not present

## 2019-01-05 DIAGNOSIS — N400 Enlarged prostate without lower urinary tract symptoms: Secondary | ICD-10-CM | POA: Diagnosis not present

## 2019-02-04 ENCOUNTER — Ambulatory Visit: Payer: PPO | Attending: Internal Medicine

## 2019-02-04 DIAGNOSIS — Z23 Encounter for immunization: Secondary | ICD-10-CM

## 2019-02-04 NOTE — Progress Notes (Signed)
   Covid-19 Vaccination Clinic  Name:  CHASEN ERTL    MRN: [id number] DOB: [birth year]  02/04/2019  Mr. Winkle was observed post Covid-19 immunization for 15 minutes without incidence. He was provided with Vaccine Information Sheet and instruction to access the V-Safe system.   Mr. Maile was instructed to call 911 with any severe reactions post vaccine: [name] Difficulty breathing  . Swelling of your face and throat  . A fast heartbeat  . A bad rash all over your body  . Dizziness and weakness    Immunizations Administered    Name Date Dose VIS Date Route   Pfizer COVID-19 Vaccine 02/04/2019  3:50 PM 0.3 mL 12/26/2018 Intramuscular   Manufacturer: [place]   Lot: [id number]   [place]: [id number]

## 2019-02-05 DIAGNOSIS — N400 Enlarged prostate without lower urinary tract symptoms: Secondary | ICD-10-CM | POA: Diagnosis not present

## 2019-02-05 DIAGNOSIS — I10 Essential (primary) hypertension: Secondary | ICD-10-CM | POA: Diagnosis not present

## 2019-02-05 DIAGNOSIS — E7800 Pure hypercholesterolemia, unspecified: Secondary | ICD-10-CM | POA: Diagnosis not present

## 2019-02-05 DIAGNOSIS — I2510 Atherosclerotic heart disease of native coronary artery without angina pectoris: Secondary | ICD-10-CM | POA: Diagnosis not present

## 2019-02-19 DIAGNOSIS — I10 Essential (primary) hypertension: Secondary | ICD-10-CM | POA: Diagnosis not present

## 2019-02-19 DIAGNOSIS — I2510 Atherosclerotic heart disease of native coronary artery without angina pectoris: Secondary | ICD-10-CM | POA: Diagnosis not present

## 2019-02-19 DIAGNOSIS — N400 Enlarged prostate without lower urinary tract symptoms: Secondary | ICD-10-CM | POA: Diagnosis not present

## 2019-02-19 DIAGNOSIS — E7800 Pure hypercholesterolemia, unspecified: Secondary | ICD-10-CM | POA: Diagnosis not present

## 2019-02-23 ENCOUNTER — Ambulatory Visit: Payer: PPO | Attending: Internal Medicine

## 2019-02-23 DIAGNOSIS — Z23 Encounter for immunization: Secondary | ICD-10-CM | POA: Insufficient documentation

## 2019-02-23 NOTE — Progress Notes (Signed)
   Covid-19 Vaccination Clinic  Name:  Jason Blackwell    MRN: [id number] DOB: [birth year]  02/23/2019  [name] was observed post Covid-19 immunization for 15 minutes without incidence. He was provided with Vaccine Information Sheet and instruction to access the V-Safe system.   [name] was instructed to call 911 with any severe reactions post vaccine: [name] Difficulty breathing  . Swelling of your face and throat  . A fast heartbeat  . A bad rash all over your body  . Dizziness and weakness    Immunizations Administered    Name Date Dose VIS Date Route   Pfizer COVID-19 Vaccine 02/23/2019 10:27 AM 0.3 mL 12/26/2018 Intramuscular   Manufacturer: [place]   Lot: [id number]   [place]: [id number]

## 2019-03-05 ENCOUNTER — Telehealth: Payer: Self-pay | Admitting: Interventional Cardiology

## 2019-03-05 MED ORDER — REPATHA SURECLICK 140 MG/ML ~~LOC~~ SOAJ: 140.0000 mg | SUBCUTANEOUS | 3 refills | Status: DC

## 2019-03-05 NOTE — Telephone Encounter (Signed)
Called and spoke w/pt about the [facility] and a grant was created, pt voiced understanding

## 2019-03-05 NOTE — Telephone Encounter (Signed)
  Patient calling about his Evolocumab (REPATHA SURECLICK) [id number] MG/ML SOAJ, stating he needs some forms filled out. So his medication will be covered.  He just took his last Repatha, will need his next dose in two weeks.

## 2019-03-05 NOTE — Telephone Encounter (Signed)
I spoke with patient a few months ago about apply for [employer]. Should have his info on file in the office. Will apply for patient when back in the office tomorrow.

## 2019-03-20 DIAGNOSIS — Z1159 Encounter for screening for other viral diseases: Secondary | ICD-10-CM | POA: Diagnosis not present

## 2019-03-20 DIAGNOSIS — Z6837 Body mass index (BMI) 37.0-37.9, adult: Secondary | ICD-10-CM | POA: Diagnosis not present

## 2019-03-20 DIAGNOSIS — I2510 Atherosclerotic heart disease of native coronary artery without angina pectoris: Secondary | ICD-10-CM | POA: Diagnosis not present

## 2019-03-20 DIAGNOSIS — K219 Gastro-esophageal reflux disease without esophagitis: Secondary | ICD-10-CM | POA: Diagnosis not present

## 2019-03-20 DIAGNOSIS — Z0000 Encounter for general adult medical examination without abnormal findings: Secondary | ICD-10-CM | POA: Diagnosis not present

## 2019-03-20 DIAGNOSIS — E7800 Pure hypercholesterolemia, unspecified: Secondary | ICD-10-CM | POA: Diagnosis not present

## 2019-03-20 DIAGNOSIS — Z1389 Encounter for screening for other disorder: Secondary | ICD-10-CM | POA: Diagnosis not present

## 2019-03-20 DIAGNOSIS — I10 Essential (primary) hypertension: Secondary | ICD-10-CM | POA: Diagnosis not present

## 2019-03-20 DIAGNOSIS — N400 Enlarged prostate without lower urinary tract symptoms: Secondary | ICD-10-CM | POA: Diagnosis not present

## 2019-03-20 DIAGNOSIS — I350 Nonrheumatic aortic (valve) stenosis: Secondary | ICD-10-CM | POA: Diagnosis not present

## 2019-03-30 DIAGNOSIS — M1711 Unilateral primary osteoarthritis, right knee: Secondary | ICD-10-CM | POA: Diagnosis not present

## 2019-04-01 NOTE — Progress Notes (Signed)
Cardiology Office Note:    Date:  04/02/2019   ID:  Jason Blackwell, DOB 1945/04/13, MRN [id number]  PCP:  Lavone Orn, MD  Cardiologist:  Sinclair Grooms, MD   Referring MD: Lavone Orn, MD   Chief Complaint  Patient presents with  . Follow-up    Head fullness  . Coronary Artery Disease  . Cardiac Valve Problem    Aortic stenosis    History of Present Illness:    Jason Blackwell is a 74 y.o. male with a hx of CADwith ostial to proximal LAD DES 3 in 2015, aortic stenosis, hypertension, hyperlipidemia, and diastolic left ventricular dysfunction.  He uses tadalafil because of BPH.  He recently switched to this from other therapy.  Since his medication, he notices that he becomes flushed and it feels his head is swelling when he reaches the climax.  It resolves after 3 to 5 minutes.  He is not particularly short of breath and denies angina.  He denies lower extremity swelling, orthopnea, PND, and syncope.  He has gained weight and decreased exertional tolerance since the COVID-19 pandemic.  Past Medical History:  Diagnosis Date  . Arthritis    right knee  . BPH (benign prostatic hypertrophy)   . Colon adenoma   . Complication of anesthesia    Respiratory issues with Succinylcholine  . Coronary artery disease   . Ejection fraction   . Erectile dysfunction   . Fecal incontinence   . Heart murmur [id number]   LVH, diastolic dysfunction, aortic sclerosis  . Hemorrhoids   . Hyperlipidemia   . Hypertension   . Insomnia   . Kidney stones    "passed them"    Past Surgical History:  Procedure Laterality Date  . CATARACT EXTRACTION W/PHACO Right 09/24/2018   Procedure: CATARACT EXTRACTION PHACO AND INTRAOCULAR LENS PLACEMENT (IOC)COMPLICATED RIGHT;  Surgeon: Leandrew Koyanagi, MD;  Location: [place];  Service: Ophthalmology;  Laterality: Right;  MAYLUGIN  . CATARACT EXTRACTION W/PHACO Left 10/15/2018   Procedure: CATARACT EXTRACTION PHACO AND INTRAOCULAR  LENS PLACEMENT (IOC) RIGHT  01:16.3  17.9%  13.79;  Surgeon: Leandrew Koyanagi, MD;  Location: [place];  Service: Ophthalmology;  Laterality: Left;  . CORONARY ANGIOPLASTY WITH STENT PLACEMENT  12/31/2013   "3"  . LEFT HEART CATHETERIZATION WITH CORONARY ANGIOGRAM N/A 12/31/2013   Procedure: LEFT HEART CATHETERIZATION WITH CORONARY ANGIOGRAM;  Surgeon: Sinclair Grooms, MD;  Location[facility];  Service: Cardiovascular;  Laterality: N/A; Ostial and mid LAD stenting  . PROSTATE BIOPSY  ~ 1991  . SKIN LESION EXCISION Right 2013   precancerous lesion from arm  . TONSILLECTOMY      Current Medications: Current Meds  Medication Sig  . AMLODIPINE BESYLATE PO Take 10 mg by mouth daily.  [name] aspirin EC 81 MG tablet Take 81 mg by mouth daily.  . chlorthalidone (HYGROTON) 25 MG tablet TAKE 1 TABLET (25 MG TOTAL) BY MOUTH DAILY.  [name] Evolocumab (REPATHA SURECLICK) [id number] MG/ML SOAJ Inject 140 mg into the skin every 14 (fourteen) days.  [name] OVER THE COUNTER MEDICATION daily. Super beets  . simethicone (MYLICON) 80 MG chewable tablet Chew 80 mg by mouth as needed.   . tadalafil (CIALIS) 5 MG tablet Take 5 mg by mouth daily as needed for erectile dysfunction.  [name] telmisartan (MICARDIS) 80 MG tablet Take 80 mg by mouth daily.  . tizanidine (ZANAFLEX) 2 MG capsule Take 1 capsule (2 mg total) by mouth 2 (two) times daily  as needed for muscle spasms.  . [DISCONTINUED] losartan (COZAAR) 100 MG tablet Take 100 mg by mouth daily.  . [DISCONTINUED] meloxicam (MOBIC) 7.5 MG tablet Take 7.5 mg by mouth daily.  . [DISCONTINUED] naproxen (NAPROSYN) 250 MG tablet Take 2 tablets (500 mg total) by mouth 2 (two) times daily with a meal.  . [DISCONTINUED] sildenafil (REVATIO) 20 MG tablet Take 20 mg by mouth every 12 (twelve) hours as needed. FOR ED  . [DISCONTINUED] Vitamin D, Cholecalciferol, 50 MCG (2000 UT) CAPS Take by mouth as needed.     Allergies:   Simvastatin, Succinylcholine chloride, and  Sulfamethoxazole   Social History   Socioeconomic History  . Marital status: Married    Spouse name: Not on file  . Number of children: Not on file  . Years of education: Not on file  . Highest education level: Not on file  Occupational History  . Not on file  Tobacco Use  . Smoking status: Never Smoker  . Smokeless tobacco: Never Used  Substance and Sexual Activity  . Alcohol use: Yes    Alcohol/week: 28.0 standard drinks    Types: 28 Glasses of wine per week  . Drug use: No  . Sexual activity: Yes  Other Topics Concern  . Not on file  Social History Narrative  . Not on file   Social Determinants of Health   Financial Resource Strain:   . Difficulty of Paying Living Expenses:   Food Insecurity:   . Worried About Charity fundraiser in the Last Year:   . Arboriculturist in the Last Year:   Transportation Needs:   . Film/video editor (Medical):   [name] Lack of Transportation (Non-Medical):   Physical Activity:   . Days of Exercise per Week:   . Minutes of Exercise per Session:   Stress:   . Feeling of Stress :   Social Connections:   . Frequency of Communication with Friends and Family:   . Frequency of Social Gatherings with Friends and Family:   . Attends Religious Services:   . Active Member of Clubs or Organizations:   . Attends Archivist Meetings:   [name] Marital Status:      Family History: The patient's family history includes Cancer - Colon in his maternal uncle; Diabetes in his sister; Heart attack in his father; Heart disease in his mother.  ROS:   Please see the history of present illness.    Erectile dysfunction for which tadalafil that he uses for prostatic hypertrophy helps.  All other systems reviewed and are negative.  EKGs/Labs/Other Studies Reviewed:    The following studies were reviewed today: Last echocardiogram demonstrated mild aortic stenosis in 2018  EKG:  EKG EKG is not performed today.  Recent Labs: No results found  for requested labs within last 8760 hours.  Recent Lipid Panel    Component Value Date/Time   CHOL 149 05/21/2017 0930   TRIG 89 05/21/2017 0930   HDL 59 05/21/2017 0930   CHOLHDL 2.5 05/21/2017 0930   LDLCALC 72 05/21/2017 0930    Physical Exam:    VS:  BP 112/74   Pulse 67   Ht 5\' 11"  (1.803 m)   Wt 246 lb (111.6 kg)   SpO2 98%   BMI 34.31 kg/m     Wt Readings from Last 3 Encounters:  04/02/19 246 lb (111.6 kg)  10/15/18 240 lb (108.9 kg)  09/24/18 243 lb (110.2 kg)     GEN:  Obese. No acute distress HEENT: Normal NECK: No JVD. LYMPHATICS: No lymphadenopathy CARDIAC: The over 6 right upper sternal crescendo decrescendo systolic murmur RRR without diastolic murmur, gallop, or edema. VASCULAR:  Normal Pulses. No bruits. RESPIRATORY:  Clear to auscultation without rales, wheezing or rhonchi  ABDOMEN: Soft, non-tender, non-distended, No pulsatile mass, MUSCULOSKELETAL: No deformity  SKIN: Warm and dry NEUROLOGIC:  Alert and oriented x 3 PSYCHIATRIC:  Normal affect   ASSESSMENT:    1. Coronary artery disease involving native coronary artery of native heart without angina pectoris   2. Nonrheumatic aortic valve stenosis   3. Essential hypertension   4. Mixed hyperlipidemia   5. Educated about COVID-19 virus infection    PLAN:    In order of problems listed above:  1. Secondary prevention discussed in detail 2. Probable progression of aortic stenosis.  Repeat 2D Doppler echocardiogram 3. Excellent blood pressure control, slightly lower than before.  Could be related to aortic valve. 4. Most recent LDL was very good at 80 on March 20, 2019.  Target is less than 70. 5. Erectile dysfunction with flushing after ejaculation.  Says his face gets real red and he feels that his head is swelling.  This goes away after 5 minutes.  May not of been present before he was switched to generic tadalafil 6. COVID-19 vaccine has been received.  Social distancing is  emphasized.  Overall education and awareness concerning primary/secondary risk prevention was discussed in detail: LDL less than 70, hemoglobin A1c less than 7, blood pressure target less than 130/80 mmHg, >150 minutes of moderate aerobic activity per week, avoidance of smoking, weight control (via diet and exercise), and continued surveillance/management of/for obstructive sleep apnea.    Medication Adjustments/Labs and Tests Ordered: Current medicines are reviewed at length with the patient today.  Concerns regarding medicines are outlined above.  Orders Placed This Encounter  Procedures  . ECHOCARDIOGRAM COMPLETE   No orders of the defined types were placed in this encounter.   Patient Instructions  Medication Instructions:  Your physician recommends that you continue on your current medications as directed. Please refer to the Current Medication list given to you today.  *If you need a refill on your cardiac medications before your next appointment, please call your pharmacy*   Lab Work: None If you have labs (blood work) drawn today and your tests are completely normal, you will receive your results only by: [name] MyChart Message (if you have MyChart) OR . A paper copy in the mail If you have any lab test that is abnormal or we need to change your treatment, we will call you to review the results.   Testing/Procedures: Your physician has requested that you have an echocardiogram. Echocardiography is a painless test that uses sound waves to create images of your heart. It provides your doctor with information about the size and shape of your heart and how well your heart's chambers and valves are working. This procedure takes approximately one hour. There are no restrictions for this procedure.    Follow-Up: At [facility], you and your health needs are our priority.  As part of our continuing mission to provide you with exceptional heart care, we have created designated  Provider Care Teams.  These Care Teams include your primary Cardiologist (physician) and Advanced Practice Providers (APPs -  Physician Assistants and Nurse Practitioners) who all work together to provide you with the care you need, when you need it.  We recommend signing up for the patient  portal called "MyChart".  Sign up information is provided on this After Visit Summary.  MyChart is used to connect with patients for Virtual Visits (Telemedicine).  Patients are able to view lab/test results, encounter notes, upcoming appointments, etc.  Non-urgent messages can be sent to your provider as well.   To learn more about what you can do with MyChart, go to [email or web address].    Your next appointment:   12 month(s)  The format for your next appointment:   In Person  Provider:   You may see Sinclair Grooms, MD or one of the following Advanced Practice Providers on your designated Care Team:    Truitt Merle, NP  Cecilie Kicks, NP  Kathyrn Drown, NP    Other Instructions      Signed, Sinclair Grooms, MD  04/02/2019 4:08 PM    [place]

## 2019-04-02 ENCOUNTER — Encounter: Payer: Self-pay | Admitting: Interventional Cardiology

## 2019-04-02 ENCOUNTER — Ambulatory Visit: Payer: PPO | Admitting: Interventional Cardiology

## 2019-04-02 ENCOUNTER — Other Ambulatory Visit: Payer: Self-pay

## 2019-04-02 VITALS — BP 112/74 | HR 67 | Ht 71.0 in | Wt 246.0 lb

## 2019-04-02 DIAGNOSIS — E782 Mixed hyperlipidemia: Secondary | ICD-10-CM

## 2019-04-02 DIAGNOSIS — Z7189 Other specified counseling: Secondary | ICD-10-CM

## 2019-04-02 DIAGNOSIS — I10 Essential (primary) hypertension: Secondary | ICD-10-CM | POA: Diagnosis not present

## 2019-04-02 DIAGNOSIS — I2510 Atherosclerotic heart disease of native coronary artery without angina pectoris: Secondary | ICD-10-CM

## 2019-04-02 DIAGNOSIS — I350 Nonrheumatic aortic (valve) stenosis: Secondary | ICD-10-CM | POA: Diagnosis not present

## 2019-04-02 NOTE — Patient Instructions (Signed)
Medication Instructions:  °Your physician recommends that you continue on your current medications as directed. Please refer to the Current Medication list given to you today. ° °*If you need a refill on your cardiac medications before your next appointment, please call your pharmacy* ° ° °Lab Work: °None °If you have labs (blood work) drawn today and your tests are completely normal, you will receive your results only by: °• MyChart Message (if you have MyChart) OR °• A paper copy in the mail °If you have any lab test that is abnormal or we need to change your treatment, we will call you to review the results. ° ° °Testing/Procedures: °Your physician has requested that you have an echocardiogram. Echocardiography is a painless test that uses sound waves to create images of your heart. It provides your doctor with information about the size and shape of your heart and how well your heart’s chambers and valves are working. This procedure takes approximately one hour. There are no restrictions for this procedure. ° ° °Follow-Up: °At [facility], you and your health needs are our priority.  As part of our continuing mission to provide you with exceptional heart care, we have created designated Provider Care Teams.  These Care Teams include your primary Cardiologist (physician) and Advanced Practice Providers (APPs -  Physician Assistants and Nurse Practitioners) who all work together to provide you with the care you need, when you need it. ° °We recommend signing up for the patient portal called "MyChart".  Sign up information is provided on this After Visit Summary.  MyChart is used to connect with patients for Virtual Visits (Telemedicine).  Patients are able to view lab/test results, encounter notes, upcoming appointments, etc.  Non-urgent messages can be sent to your provider as well.   °To learn more about what you can do with MyChart, go to [email or web address].   ° °Your next appointment:   °12  month(s) ° °The format for your next appointment:   °In Person ° °Provider:   °You may see Henry W Smith III, MD or one of the following Advanced Practice Providers on your designated Care Team:   °· Lori Gerhardt, NP °· Laura Ingold, NP °· Jill McDaniel, NP ° ° ° °Other Instructions ° ° °

## 2019-04-13 ENCOUNTER — Ambulatory Visit (HOSPITAL_COMMUNITY): Payer: PPO | Attending: Cardiology

## 2019-04-13 ENCOUNTER — Other Ambulatory Visit: Payer: Self-pay

## 2019-04-13 DIAGNOSIS — I350 Nonrheumatic aortic (valve) stenosis: Secondary | ICD-10-CM | POA: Diagnosis not present

## 2019-04-16 DIAGNOSIS — I10 Essential (primary) hypertension: Secondary | ICD-10-CM | POA: Diagnosis not present

## 2019-04-24 DIAGNOSIS — K625 Hemorrhage of anus and rectum: Secondary | ICD-10-CM | POA: Diagnosis not present

## 2019-04-27 DIAGNOSIS — Z86010 Personal history of colonic polyps: Secondary | ICD-10-CM | POA: Diagnosis not present

## 2019-04-27 DIAGNOSIS — K648 Other hemorrhoids: Secondary | ICD-10-CM | POA: Diagnosis not present

## 2019-05-11 ENCOUNTER — Other Ambulatory Visit: Payer: Self-pay | Admitting: Urology

## 2019-05-18 DIAGNOSIS — Z961 Presence of intraocular lens: Secondary | ICD-10-CM | POA: Diagnosis not present

## 2019-05-22 DIAGNOSIS — E7800 Pure hypercholesterolemia, unspecified: Secondary | ICD-10-CM | POA: Diagnosis not present

## 2019-05-22 DIAGNOSIS — I10 Essential (primary) hypertension: Secondary | ICD-10-CM | POA: Diagnosis not present

## 2019-05-22 DIAGNOSIS — I2510 Atherosclerotic heart disease of native coronary artery without angina pectoris: Secondary | ICD-10-CM | POA: Diagnosis not present

## 2019-05-22 DIAGNOSIS — N400 Enlarged prostate without lower urinary tract symptoms: Secondary | ICD-10-CM | POA: Diagnosis not present

## 2019-05-27 DIAGNOSIS — K921 Melena: Secondary | ICD-10-CM | POA: Diagnosis not present

## 2019-05-27 DIAGNOSIS — K649 Unspecified hemorrhoids: Secondary | ICD-10-CM | POA: Diagnosis not present

## 2019-05-27 DIAGNOSIS — L309 Dermatitis, unspecified: Secondary | ICD-10-CM | POA: Diagnosis not present

## 2019-05-27 DIAGNOSIS — K5900 Constipation, unspecified: Secondary | ICD-10-CM | POA: Diagnosis not present

## 2019-05-27 DIAGNOSIS — Z86010 Personal history of colonic polyps: Secondary | ICD-10-CM | POA: Diagnosis not present

## 2019-06-23 NOTE — Patient Instructions (Addendum)
DUE TO COVID-19 ONLY ONE VISITOR IS ALLOWED TO COME WITH YOU AND STAY IN THE WAITING ROOM ONLY DURING PRE OP AND PROCEDURE DAY OF SURGERY. THE 1 VISITOR MAY VISIT WITH YOU AFTER SURGERY IN YOUR PRIVATE ROOM DURING VISITING HOURS ONLY!  YOU NEED TO HAVE A COVID 19 TEST ON: 07/06/19 @ 2:00 pm , THIS TEST MUST BE DONE BEFORE SURGERY, COME  [address], [place] , [id number].  ([place]) ONCE YOUR COVID TEST IS COMPLETED, PLEASE BEGIN THE QUARANTINE INSTRUCTIONS AS OUTLINED IN YOUR HANDOUT.                Barrie Lyme    Your procedure is scheduled on: 07/09/19   Report to [facility] Main  Entrance   Report to short stay at: 5:30 AM     Call this number if you have problems the morning of surgery [phone]    Remember: Do not eat food or drink liquids :After Midnight.   BRUSH YOUR TEETH MORNING OF SURGERY AND RINSE YOUR MOUTH OUT, NO CHEWING GUM CANDY OR MINTS.     Take these medicines the morning of surgery with A SIP OF WATER: amlodipine,famotidine.                                 You may not have any metal on your body including hair pins and              piercings  Do not wear jewelry, lotions, powders or perfumes, deodorant             Men may shave face and neck.   Do not bring valuables to the hospital. [place].  Contacts, dentures or bridgework may not be worn into surgery.  Leave suitcase in the car. After surgery it may be brought to your room.     Patients discharged the day of surgery will not be allowed to drive home. IF YOU ARE HAVING SURGERY AND GOING HOME THE SAME DAY, YOU MUST HAVE AN ADULT TO DRIVE YOU HOME AND BE WITH YOU FOR 24 HOURS. YOU MAY GO HOME BY TAXI OR UBER OR ORTHERWISE, BUT AN ADULT MUST ACCOMPANY YOU HOME AND STAY WITH YOU FOR 24 HOURS.  Name and phone number of your driver:  Special Instructions: N/A              Please read over the following fact sheets you  were given: _____________________________________________________________________ [facility] - Preparing for Surgery Before surgery, you can play an important role.  Because skin is not sterile, your skin needs to be as free of germs as possible.  You can reduce the number of germs on your skin by washing with CHG (chlorahexidine gluconate) soap before surgery.  CHG is an antiseptic cleaner which kills germs and bonds with the skin to continue killing germs even after washing. Please DO NOT use if you have an allergy to CHG or antibacterial soaps.  If your skin becomes reddened/irritated stop using the CHG and inform your nurse when you arrive at Short Stay. Do not shave (including legs and underarms) for at least 48 hours prior to the first CHG shower.  You may shave your face/neck. Please follow these instructions carefully:  1.  Shower with CHG Soap the night before  surgery and the  morning of Surgery.  2.  If you choose to wash your hair, wash your hair first as usual with your  normal  shampoo.  3.  After you shampoo, rinse your hair and body thoroughly to remove the  shampoo.                           4.  Use CHG as you would any other liquid soap.  You can apply chg directly  to the skin and wash                       Gently with a scrungie or clean washcloth.  5.  Apply the CHG Soap to your body ONLY FROM THE NECK DOWN.   Do not use on face/ open                           Wound or open sores. Avoid contact with eyes, ears mouth and genitals (private parts).                       Wash face,  Genitals (private parts) with your normal soap.             6.  Wash thoroughly, paying special attention to the area where your surgery  will be performed.  7.  Thoroughly rinse your body with warm water from the neck down.  8.  DO NOT shower/wash with your normal soap after using and rinsing off  the CHG Soap.                9.  Pat yourself dry with a clean towel.            10.  Wear clean  pajamas.            11.  Place clean sheets on your bed the night of your first shower and do not  sleep with pets. Day of Surgery : Do not apply any lotions/deodorants the morning of surgery.  Please wear clean clothes to the hospital/surgery center.  FAILURE TO FOLLOW THESE INSTRUCTIONS MAY RESULT IN THE CANCELLATION OF YOUR SURGERY PATIENT SIGNATURE_________________________________  NURSE SIGNATURE__________________________________  ________________________________________________________________________

## 2019-06-24 ENCOUNTER — Other Ambulatory Visit: Payer: Self-pay

## 2019-06-24 ENCOUNTER — Encounter (HOSPITAL_COMMUNITY)
Admission: RE | Admit: 2019-06-24 | Discharge: 2019-06-24 | Disposition: A | Discharge status: Home / Self Care | Payer: PPO | Source: Ambulatory Visit | Attending: Urology | Admitting: Urology

## 2019-06-24 ENCOUNTER — Encounter (HOSPITAL_COMMUNITY): Payer: Self-pay

## 2019-06-24 DIAGNOSIS — Z01818 Encounter for other preprocedural examination: Secondary | ICD-10-CM | POA: Insufficient documentation

## 2019-06-24 NOTE — Progress Notes (Signed)
COVID Vaccine Completed:yes Date COVID Vaccine completed:02/23/19 COVID vaccine manufacturer: *Pfizer    [employer]   PCP - Dr. Lavone Orn Cardiologist - Dr. Daneen Schick. LOV: 04/02/19  Chest x-ray -  EKG - 07/23/18 EPIC Stress Test -  ECHO - 04/13/19 EPIC Cardiac Cath -   Sleep Study -  CPAP -   Fasting Blood Sugar -  Checks Blood Sugar _____ times a day  Blood Thinner Instructions:Dr. Dahlstedt. Aspirin Instructions: To stop five days before surgery. Last Dose:  Anesthesia review: Hx: HTN,heart murmur,CAD  Patient denies shortness of breath, fever, cough and chest pain at PAT appointment   Patient verbalized understanding of instructions that were given to them at the PAT appointment. Patient was also instructed that they will need to review over the PAT instructions again at home before surgery.

## 2019-06-25 DIAGNOSIS — K641 Second degree hemorrhoids: Secondary | ICD-10-CM | POA: Diagnosis not present

## 2019-07-01 ENCOUNTER — Encounter (HOSPITAL_COMMUNITY)
Admission: RE | Admit: 2019-07-01 | Discharge: 2019-07-01 | Disposition: A | Discharge status: Home / Self Care | Payer: PPO | Source: Ambulatory Visit | Attending: Urology | Admitting: Urology

## 2019-07-01 ENCOUNTER — Other Ambulatory Visit: Payer: Self-pay

## 2019-07-01 DIAGNOSIS — Z01812 Encounter for preprocedural laboratory examination: Secondary | ICD-10-CM | POA: Diagnosis not present

## 2019-07-01 DIAGNOSIS — R972 Elevated prostate specific antigen [PSA]: Secondary | ICD-10-CM | POA: Diagnosis not present

## 2019-07-01 DIAGNOSIS — N401 Enlarged prostate with lower urinary tract symptoms: Secondary | ICD-10-CM | POA: Diagnosis not present

## 2019-07-01 DIAGNOSIS — R351 Nocturia: Secondary | ICD-10-CM | POA: Diagnosis not present

## 2019-07-01 LAB — BASIC METABOLIC PANEL
Anion gap: 12 (ref 5–15)
BUN: 15 mg/dL (ref 8–23)
CO2: 27 mmol/L (ref 22–32)
Calcium: 8.9 mg/dL (ref 8.9–10.3)
Chloride: 101 mmol/L (ref 98–111)
Creatinine, Ser: 0.61 mg/dL (ref 0.61–1.24)
GFR calc Af Amer: >60 mL/min (ref >60)
GFR calc non Af Amer: >60 mL/min (ref >60)
Glucose, Bld: 105 mg/dL — ABNORMAL HIGH (ref 70–99)
Potassium: 3.2 mmol/L — ABNORMAL LOW (ref 3.5–5.1)
Sodium: 140 mmol/L (ref 135–145)

## 2019-07-01 LAB — CBC
HCT: 42.4 % (ref 39.0–52.0)
Hemoglobin: 13.7 g/dL (ref 13.0–17.0)
MCH: 30.2 pg (ref 26.0–34.0)
MCHC: 32.3 g/dL (ref 30.0–36.0)
MCV: 93.4 fL (ref 80.0–100.0)
Platelets: 336 10*3/uL (ref 150–400)
RBC: 4.54 MIL/uL (ref 4.22–5.81)
RDW: 13.7 % (ref 11.5–15.5)
WBC: 7.9 10*3/uL (ref 4.0–10.5)
nRBC: 0 % (ref 0.0–0.2)

## 2019-07-02 NOTE — Progress Notes (Signed)
Anesthesia Chart Review   Case: [id number] Date/Time: 07/09/19 0715   Procedure: TRANSURETHRAL RESECTION OF THE PROSTATE (TURP) (N/A )   Anesthesia type: General   Pre-op diagnosis: BENIGN PROSTATE HYPERPLASIA   Location: [facility]   Surgeons: Franchot Gallo, MD      DISCUSSION:74 y.o. never smoker with h/o HTN, HLD, CAD (stent to LAD x3 2015), mild AS (mean gradient 15.0 mmHg, valve area 2.07 cm2), BPH scheduled for above procedure 07/09/2019 with Dr. Franchot Gallo.   Pt last seen by cardiology 04/02/2019.  Echo repeated to evaluate progression of AS; stable mild AS.  12 month follow up recommended.   VS: BP (!) 147/85    Pulse 66    Temp 36.4 C (Oral)    Resp 16    Ht 5\' 11"  (1.803 m)    Wt 110.2 kg    SpO2 98%    BMI 33.89 kg/m   PROVIDERS: Lavone Orn, MD is PCP   Daneen Schick, MD is Cardiologist  LABS: Labs reviewed: Acceptable for surgery. (all labs ordered are listed, but only abnormal results are displayed)  Labs Reviewed  BASIC METABOLIC PANEL - Abnormal; Notable for the following components:      Result Value   Potassium 3.2 (*)    Glucose, Bld 105 (*)    All other components within normal limits  CBC     IMAGES:   EKG: 07/23/2018 Rate 58 bpm  Sinus bradycardia with 1st degree AV block  Rightward axis   CV: Echo 04/13/2019 IMPRESSIONS   1. Normal LV systolic function; mild LVH; grade 1 diastolic dysfunction;  calcified aortic valve with reduced excursion of left coronary cusp; mild  AS (mean gradient 15 mmHg).  2. Left ventricular ejection fraction, by estimation, is 60 to 65%. The  left ventricle has normal function. The left ventricle has no regional  wall motion abnormalities. There is mild left ventricular hypertrophy.  Left ventricular diastolic parameters  are consistent with Grade I diastolic dysfunction (impaired relaxation).  3. Right ventricular systolic function is normal. The right ventricular  size is normal.  4.  The mitral valve is normal in structure. No evidence of mitral valve  regurgitation. No evidence of mitral stenosis.  5. The aortic valve is tricuspid. Aortic valve regurgitation is not  visualized. Mild aortic valve stenosis.  6. The inferior vena cava is normal in size with greater than 50%  respiratory variability, suggesting right atrial pressure of 3 mmHg.   Myocardial Perfusion 06/05/2016  Nuclear stress EF: 62%.  Blood pressure demonstrated a normal response to exercise.  There was no ST segment deviation noted during stress.  No T wave inversion was noted during stress.  The study is normal.  This is a low risk study.   Low risk stress nuclear study with normal perfusion and normal left ventricular regional and global systolic function. Anteroapical ischemia seen in 2015 is no longer present.  Past Medical History:  Diagnosis Date   Arthritis    right knee   BPH (benign prostatic hypertrophy)    Colon adenoma    Complication of anesthesia    Respiratory issues with Succinylcholine   Coronary artery disease    3 stent   Ejection fraction    Erectile dysfunction    Fecal incontinence    Heart murmur 3976   LVH, diastolic dysfunction, aortic sclerosis   Hemorrhoids    Hyperlipidemia    Hypertension    Insomnia    Kidney stones    "  passed them"    Past Surgical History:  Procedure Laterality Date   CATARACT EXTRACTION W/PHACO Right 09/24/2018   Procedure: CATARACT EXTRACTION PHACO AND INTRAOCULAR LENS PLACEMENT (IOC)COMPLICATED RIGHT;  Surgeon: Leandrew Koyanagi, MD;  Location: [facility] [facility];  Service: Ophthalmology;  Laterality: Right;  MAYLUGIN   CATARACT EXTRACTION W/PHACO Left 10/15/2018   Procedure: CATARACT EXTRACTION PHACO AND INTRAOCULAR LENS PLACEMENT (IOC) RIGHT  01:16.3  17.9%  13.79;  Surgeon: Leandrew Koyanagi, MD;  Location: [place];  Service: Ophthalmology;  Laterality: Left;   CORONARY ANGIOPLASTY WITH  STENT PLACEMENT  12/31/2013   "3"   LEFT HEART CATHETERIZATION WITH CORONARY ANGIOGRAM N/A 12/31/2013   Procedure: LEFT HEART CATHETERIZATION WITH CORONARY ANGIOGRAM;  Surgeon: Sinclair Grooms, MD;  Location: [facility] CATH LAB;  Service: Cardiovascular;  Laterality: N/A; Ostial and mid LAD stenting   PROSTATE BIOPSY  ~ [place] Right 2013   precancerous lesion from arm   TONSILLECTOMY      MEDICATIONS:  acetaminophen (TYLENOL) 500 MG tablet   amLODipine (NORVASC) 10 MG tablet   aspirin EC 81 MG tablet   chlorthalidone (HYGROTON) 25 MG tablet   Evolocumab (REPATHA SURECLICK) 948 MG/ML SOAJ   famotidine (PEPCID) 20 MG tablet   OVER THE COUNTER MEDICATION   tadalafil (CIALIS) 5 MG tablet   telmisartan (MICARDIS) 80 MG tablet   No current facility-administered medications for this encounter.     Maia Plan [facility] Pre-Surgical Testing [phone] 07/02/19  2:51 PM

## 2019-07-06 ENCOUNTER — Other Ambulatory Visit (HOSPITAL_COMMUNITY)
Admission: RE | Admit: 2019-07-06 | Discharge: 2019-07-06 | Disposition: A | Discharge status: Home / Self Care | Payer: PPO | Source: Ambulatory Visit | Attending: Urology | Admitting: Urology

## 2019-07-06 DIAGNOSIS — Z20822 Contact with and (suspected) exposure to covid-19: Secondary | ICD-10-CM | POA: Insufficient documentation

## 2019-07-06 DIAGNOSIS — Z01812 Encounter for preprocedural laboratory examination: Secondary | ICD-10-CM | POA: Diagnosis not present

## 2019-07-06 LAB — SARS CORONAVIRUS 2 (TAT 6-24 HRS): SARS Coronavirus 2: NEGATIVE

## 2019-07-08 NOTE — H&P (Signed)
H&P  Chief Complaint: BPH  History of Present Illness: 74 year old male presents at this time for elective TURP.  He has BPH with glandular size greater than 100 mL.  He does not qualify for minimally invasive procedure, and at this point has chosen TURP.  I discussed the procedure with the patient, risks, complications, the overnight stay and need for catheterization.  He understands and desires to proceed.  Past Medical History:  Diagnosis Date  . Arthritis    right knee  . BPH (benign prostatic hypertrophy)   . Colon adenoma   . Complication of anesthesia    Respiratory issues with Succinylcholine  . Coronary artery disease    3 stent  . Ejection fraction   . Erectile dysfunction   . Fecal incontinence   . Heart murmur 7124   LVH, diastolic dysfunction, aortic sclerosis  . Hemorrhoids   . Hyperlipidemia   . Hypertension   . Insomnia   . Kidney stones    "passed them"    Past Surgical History:  Procedure Laterality Date  . CATARACT EXTRACTION W/PHACO Right 09/24/2018   Procedure: CATARACT EXTRACTION PHACO AND INTRAOCULAR LENS PLACEMENT (IOC)COMPLICATED RIGHT;  Surgeon: Leandrew Koyanagi, MD;  Location: [place];  Service: Ophthalmology;  Laterality: Right;  MAYLUGIN  . CATARACT EXTRACTION W/PHACO Left 10/15/2018   Procedure: CATARACT EXTRACTION PHACO AND INTRAOCULAR LENS PLACEMENT (IOC) RIGHT  01:16.3  17.9%  13.79;  Surgeon: Leandrew Koyanagi, MD;  Location: [place];  Service: Ophthalmology;  Laterality: Left;  . CORONARY ANGIOPLASTY WITH STENT PLACEMENT  12/31/2013   "3"  . LEFT HEART CATHETERIZATION WITH CORONARY ANGIOGRAM N/A 12/31/2013   Procedure: LEFT HEART CATHETERIZATION WITH CORONARY ANGIOGRAM;  Surgeon: Sinclair Grooms, MD;  Location: [facility] C[facility] LAB;  Service: Cardiovascular;  Laterality: N/A; Ostial and mid LAD stenting  . PROSTATE BIOPSY  ~ 1991  . SKIN LESION EXCISION Right 2013   precancerous lesion from arm  . TONSILLECTOMY       Home Medications:  Allergies as of 07/08/2019      Reactions   Simvastatin Other (See Comments)   Severe muscle fatigue   Succinylcholine Chloride Other (See Comments)   Respiratory problems   Sulfa Antibiotics Itching   Sulfamethoxazole Itching          Medication List    Notice   Cannot display discharge medications because the patient has not yet been admitted.     Allergies:  Allergies  Allergen Reactions  . Simvastatin Other (See Comments)    Severe muscle fatigue   . Succinylcholine Chloride Other (See Comments)    Respiratory problems  . Sulfa Antibiotics Itching  . Sulfamethoxazole Itching          Family History  Problem Relation Age of Onset  . Heart disease Mother   . Heart attack Father   . Diabetes Sister   . Cancer - Colon Maternal Uncle     Social History:  reports that he has never smoked. He has never used smokeless tobacco. He reports current alcohol use of about 28.0 standard drinks of alcohol per week. He reports that he does not use drugs.  ROS: A complete review of systems was performed.  All systems are negative except for pertinent findings as noted.  Physical Exam:  Vital signs in last 24 hours: There were no vitals taken for this visit. Constitutional:  Alert and oriented, No acute distress Cardiovascular: Regular rate  Respiratory: Normal respiratory effort GI: Abdomen is soft, nontender,  nondistended, no abdominal masses. No CVAT.  Genitourinary: Normal male phallus, testes are descended bilaterally and non-tender and without masses, scrotum is normal in appearance without lesions or masses, perineum is normal on inspection. Lymphatic: No lymphadenopathy Neurologic: Grossly intact, no focal deficits Psychiatric: Normal mood and affect  Laboratory Data:  No results for input(s): WBC, HGB, HCT, PLT in the last 72 hours.  No results for input(s): NA, K, CL, GLUCOSE, BUN, CALCIUM, CREATININE in the last 72 hours.  Invalid  input(s): CO3   No results found for this or any previous visit (from the past 24 hour(s)). Recent Results (from the past 240 hour(s))  SARS CORONAVIRUS 2 (TAT 6-24 HRS) Nasopharyngeal Nasopharyngeal Swab     Status: None   Collection Time: 07/06/19  2:11 PM   Specimen: Nasopharyngeal Swab  Result Value Ref Range Status   SARS Coronavirus 2 NEGATIVE NEGATIVE Final    Comment: (NOTE) SARS-CoV-2 target nucleic acids are NOT DETECTED.  The SARS-CoV-2 RNA is generally detectable in upper and lower respiratory specimens during the acute phase of infection. Negative results do not preclude SARS-CoV-2 infection, do not rule out co-infections with other pathogens, and should not be used as the sole basis for treatment or other patient management decisions. Negative results must be combined with clinical observations, patient history, and epidemiological information. The expected result is Negative.  Fact Sheet for Patients: SugarRoll.be  Fact Sheet for Healthcare Providers: https://www.woods-mathews.com/  This test is not yet approved or cleared by the [place] FDA and  has been authorized for detection and/or diagnosis of SARS-CoV-2 by FDA under an Emergency Use Authorization (EUA). This EUA will remain  in effect (meaning this test can be used) for the duration of the COVID-19 declaration under Se ction 564(b)(1) of the Act, 21 U.S.C. section 360bbb-3(b)(1), unless the authorization is terminated or revoked sooner.  Performed at [facility], [place] [address] [place], [place] [address]     Renal Function: No results for input(s): CREATININE in the last 168 hours. Estimated Creatinine Clearance: 102.3 mL/min (by C-G formula based on SCr of 0.61 mg/dL).  Radiologic Imaging: No results found.  Impression/Assessment:  BPH with significant lower urinary tract symptoms  Plan:  TURP

## 2019-07-08 NOTE — Anesthesia Preprocedure Evaluation (Addendum)
Anesthesia Evaluation  Patient identified by MRN, date of birth, ID band Patient awake    Reviewed: Allergy & Precautions, H&P , NPO status , Patient's Chart, lab work & pertinent test results  History of Anesthesia Complications (+) DIFFICULT AIRWAY and history of anesthetic complications (difficult airway and intubation; allergy to succinylcholine)  Airway Mallampati: II  TM Distance: >3 FB Neck ROM: Full    Dental  (+) Dental Advisory Given, Teeth Intact   Pulmonary neg pulmonary ROS,    Pulmonary exam normal breath sounds clear to auscultation       Cardiovascular hypertension, + CAD (s/p stents)  Normal cardiovascular exam+ Valvular Problems/Murmurs (murmur)  Rhythm:Regular Rate:Normal + Systolic murmurs ECG 03/21/32: 1st degree AVB   Neuro/Psych negative neurological ROS     GI/Hepatic negative GI ROS,   Endo/Other  negative endocrine ROS  Renal/GU Renal disease (hx nephrolithiasis)     Musculoskeletal  (+) Arthritis ,   Abdominal   Peds  Hematology negative hematology ROS (+)   Anesthesia Other Findings Cardiology note 07/23/18:  ASSESSMENT:  1. Coronary artery disease involving native coronary artery of native heart without angina pectoris  2. Nonrheumatic aortic valve stenosis  3. Essential hypertension  4. Mixed hyperlipidemia   PLAN:  In order of problems listed above: 1. Stable.  Aggressive lipid-lowering with Repatha.  Most recent LDL was 72.  Secondary prevention discussed. 2. Mild aortic stenosis, 2018.  With exertional dyspnea, will repeat echo cardiogram. 3. Target blood pressure 130/80 mmHg.  Low-salt diet discussed. 4. LDL target less than 70.   Reproductive/Obstetrics                            Anesthesia Physical  Anesthesia Plan  ASA: IV  Anesthesia Plan: General   Post-op Pain Management:    Induction: Intravenous  PONV Risk Score and Plan: 1 and  Treatment may vary due to age or medical condition, Ondansetron and Dexamethasone  Airway Management Planned: LMA  Additional Equipment: None  Intra-op Plan:   Post-operative Plan: Extubation in OR  Informed Consent: I have reviewed the patients History and Physical, chart, labs and discussed the procedure including the risks, benefits and alternatives for the proposed anesthesia with the patient or authorized representative who has indicated his/her understanding and acceptance.     Dental advisory given  Plan Discussed with: CRNA  Anesthesia Plan Comments: (Wears med alert necklace stating pt is difficult airway and intubation, and allergic to succinylcholine.)      Anesthesia Quick Evaluation                                  Anesthesia Evaluation  Patient identified by MRN, date of birth, ID band Patient awake    Reviewed: Allergy & Precautions, NPO status , Patient's Chart, lab work & pertinent test results  History of Anesthesia Complications (+) DIFFICULT AIRWAY and history of anesthetic complications (difficult airway and intubation; allergy to succinylcholine)  Airway Mallampati: IV   Neck ROM: Full    Dental no notable dental hx.    Pulmonary neg pulmonary ROS,    Pulmonary exam normal breath sounds clear to auscultation       Cardiovascular hypertension, + CAD (s/p stents)  + Valvular Problems/Murmurs (murmur)  Rhythm:Regular Rate:Normal + Systolic murmurs ECG 02/22/74: 1st degree AVB   Neuro/Psych negative neurological ROS     GI/Hepatic negative GI ROS,  Endo/Other  negative endocrine ROS  Renal/GU Renal disease (hx nephrolithiasis)     Musculoskeletal   Abdominal   Peds  Hematology negative hematology ROS (+)   Anesthesia Other Findings Cardiology note 07/23/18:  ASSESSMENT:  1. Coronary artery disease involving native coronary artery of native heart without angina pectoris  2. Nonrheumatic aortic valve stenosis  3.  Essential hypertension  4. Mixed hyperlipidemia   PLAN:  In order of problems listed above: 1. Stable.  Aggressive lipid-lowering with Repatha.  Most recent LDL was 72.  Secondary prevention discussed. 2. Mild aortic stenosis, 2018.  With exertional dyspnea, will repeat echo cardiogram. 3. Target blood pressure 130/80 mmHg.  Low-salt diet discussed. 4. LDL target less than 70.   Reproductive/Obstetrics                            Anesthesia Physical Anesthesia Plan  ASA: III  Anesthesia Plan: MAC   Post-op Pain Management:    Induction: Intravenous  PONV Risk Score and Plan: 1 and TIVA and Midazolam  Airway Management Planned: Natural Airway  Additional Equipment:   Intra-op Plan:   Post-operative Plan:   Informed Consent: I have reviewed the patients History and Physical, chart, labs and discussed the procedure including the risks, benefits and alternatives for the proposed anesthesia with the patient or authorized representative who has indicated his/her understanding and acceptance.       Plan Discussed with: CRNA  Anesthesia Plan Comments: (Wears med alert necklace stating pt is difficult airway and intubation, and allergic to succinylcholine.)       Anesthesia Quick Evaluation

## 2019-07-09 ENCOUNTER — Ambulatory Visit (HOSPITAL_COMMUNITY): Payer: PPO | Admitting: Certified Registered"

## 2019-07-09 ENCOUNTER — Observation Stay (HOSPITAL_COMMUNITY)
Admission: RE | Admit: 2019-07-09 | Discharge: 2019-07-11 | Disposition: A | Discharge status: Home / Self Care | Payer: PPO | Attending: Urology | Admitting: Urology

## 2019-07-09 ENCOUNTER — Ambulatory Visit (HOSPITAL_COMMUNITY): Payer: PPO | Admitting: Physician Assistant

## 2019-07-09 ENCOUNTER — Other Ambulatory Visit: Payer: Self-pay

## 2019-07-09 ENCOUNTER — Encounter (HOSPITAL_COMMUNITY)
Admission: RE | Disposition: A | Discharge status: Home / Self Care | Payer: Self-pay | Source: Home / Self Care | Attending: Urology

## 2019-07-09 ENCOUNTER — Encounter (HOSPITAL_COMMUNITY): Payer: Self-pay | Admitting: Urology

## 2019-07-09 DIAGNOSIS — Z955 Presence of coronary angioplasty implant and graft: Secondary | ICD-10-CM | POA: Insufficient documentation

## 2019-07-09 DIAGNOSIS — N320 Bladder-neck obstruction: Secondary | ICD-10-CM | POA: Insufficient documentation

## 2019-07-09 DIAGNOSIS — E785 Hyperlipidemia, unspecified: Secondary | ICD-10-CM | POA: Insufficient documentation

## 2019-07-09 DIAGNOSIS — Z882 Allergy status to sulfonamides status: Secondary | ICD-10-CM | POA: Diagnosis not present

## 2019-07-09 DIAGNOSIS — Z8249 Family history of ischemic heart disease and other diseases of the circulatory system: Secondary | ICD-10-CM | POA: Diagnosis not present

## 2019-07-09 DIAGNOSIS — M1990 Unspecified osteoarthritis, unspecified site: Secondary | ICD-10-CM | POA: Insufficient documentation

## 2019-07-09 DIAGNOSIS — Z87442 Personal history of urinary calculi: Secondary | ICD-10-CM | POA: Diagnosis not present

## 2019-07-09 DIAGNOSIS — Z888 Allergy status to other drugs, medicaments and biological substances status: Secondary | ICD-10-CM | POA: Diagnosis not present

## 2019-07-09 DIAGNOSIS — Z8719 Personal history of other diseases of the digestive system: Secondary | ICD-10-CM | POA: Insufficient documentation

## 2019-07-09 DIAGNOSIS — Z961 Presence of intraocular lens: Secondary | ICD-10-CM | POA: Insufficient documentation

## 2019-07-09 DIAGNOSIS — I10 Essential (primary) hypertension: Secondary | ICD-10-CM | POA: Insufficient documentation

## 2019-07-09 DIAGNOSIS — I2510 Atherosclerotic heart disease of native coronary artery without angina pectoris: Secondary | ICD-10-CM | POA: Diagnosis not present

## 2019-07-09 DIAGNOSIS — Z833 Family history of diabetes mellitus: Secondary | ICD-10-CM | POA: Diagnosis not present

## 2019-07-09 DIAGNOSIS — Z9842 Cataract extraction status, left eye: Secondary | ICD-10-CM | POA: Insufficient documentation

## 2019-07-09 DIAGNOSIS — Z9841 Cataract extraction status, right eye: Secondary | ICD-10-CM | POA: Insufficient documentation

## 2019-07-09 DIAGNOSIS — N138 Other obstructive and reflux uropathy: Secondary | ICD-10-CM | POA: Diagnosis present

## 2019-07-09 DIAGNOSIS — N400 Enlarged prostate without lower urinary tract symptoms: Secondary | ICD-10-CM | POA: Diagnosis not present

## 2019-07-09 DIAGNOSIS — Z7982 Long term (current) use of aspirin: Secondary | ICD-10-CM | POA: Insufficient documentation

## 2019-07-09 DIAGNOSIS — R011 Cardiac murmur, unspecified: Secondary | ICD-10-CM | POA: Insufficient documentation

## 2019-07-09 DIAGNOSIS — N401 Enlarged prostate with lower urinary tract symptoms: Principal | ICD-10-CM | POA: Insufficient documentation

## 2019-07-09 HISTORY — DX: Failed or difficult intubation, initial encounter: T88.4XXA

## 2019-07-09 HISTORY — PX: TRANSURETHRAL RESECTION OF PROSTATE: SHX73

## 2019-07-09 SURGERY — TURP (TRANSURETHRAL RESECTION OF PROSTATE)
Anesthesia: General

## 2019-07-09 MED ORDER — BELLADONNA ALKALOIDS-OPIUM 16.2-60 MG RE SUPP
RECTAL | Status: AC
  Filled 2019-07-09: qty 1

## 2019-07-09 MED ORDER — SODIUM CHLORIDE 0.45 % IV SOLN: INTRAVENOUS | Status: DC

## 2019-07-09 MED ORDER — HYDROCODONE-ACETAMINOPHEN 5-325 MG PO TABS
1.0000 | ORAL_TABLET | ORAL | Status: DC | PRN
  Administered 2019-07-09 – 2019-07-10 (×3): 1 via ORAL
  Filled 2019-07-09 (×3): qty 1

## 2019-07-09 MED ORDER — EPHEDRINE SULFATE-NACL 50-0.9 MG/10ML-% IV SOSY
PREFILLED_SYRINGE | INTRAVENOUS | Status: DC | PRN
  Administered 2019-07-09: 5 mg via INTRAVENOUS
  Administered 2019-07-09 (×2): 10 mg via INTRAVENOUS
  Administered 2019-07-09: 5 mg via INTRAVENOUS
  Administered 2019-07-09 (×2): 10 mg via INTRAVENOUS

## 2019-07-09 MED ORDER — ACETAMINOPHEN 325 MG PO TABS: 650.0000 mg | ORAL_TABLET | ORAL | Status: DC | PRN

## 2019-07-09 MED ORDER — ONDANSETRON HCL 4 MG/2ML IJ SOLN
INTRAMUSCULAR | Status: AC
  Filled 2019-07-09: qty 2

## 2019-07-09 MED ORDER — BELLADONNA ALKALOIDS-OPIUM 16.2-60 MG RE SUPP
1.0000 | Freq: Four times a day (QID) | RECTAL | Status: DC | PRN
  Administered 2019-07-09 – 2019-07-10 (×3): 1 via RECTAL
  Filled 2019-07-09 (×2): qty 1

## 2019-07-09 MED ORDER — PROPOFOL 10 MG/ML IV BOLUS
INTRAVENOUS | Status: AC
  Filled 2019-07-09: qty 20

## 2019-07-09 MED ORDER — LACTATED RINGERS IV SOLN: INTRAVENOUS | Status: DC

## 2019-07-09 MED ORDER — FENTANYL CITRATE (PF) 100 MCG/2ML IJ SOLN
INTRAMUSCULAR | Status: AC
  Filled 2019-07-09: qty 2

## 2019-07-09 MED ORDER — LIDOCAINE 2% (20 MG/ML) 5 ML SYRINGE
INTRAMUSCULAR | Status: AC
  Filled 2019-07-09: qty 5

## 2019-07-09 MED ORDER — ORAL CARE MOUTH RINSE: 15.0000 mL | Freq: Once | OROMUCOSAL | Status: AC

## 2019-07-09 MED ORDER — CHLORTHALIDONE 25 MG PO TABS
25.0000 mg | ORAL_TABLET | Freq: Every day | ORAL | Status: DC
  Administered 2019-07-10 – 2019-07-11 (×2): 25 mg via ORAL
  Filled 2019-07-09 (×2): qty 1

## 2019-07-09 MED ORDER — ZOLPIDEM TARTRATE 5 MG PO TABS
5.0000 mg | ORAL_TABLET | Freq: Every evening | ORAL | Status: DC | PRN
  Administered 2019-07-09 – 2019-07-10 (×2): 5 mg via ORAL
  Filled 2019-07-09 (×2): qty 1

## 2019-07-09 MED ORDER — SODIUM CHLORIDE 0.9 % IR SOLN
3000.0000 mL | Status: DC
  Administered 2019-07-09: 3000 mL

## 2019-07-09 MED ORDER — OXYCODONE HCL 5 MG PO TABS
5.0000 mg | ORAL_TABLET | Freq: Once | ORAL | Status: AC | PRN
  Administered 2019-07-09: 5 mg via ORAL

## 2019-07-09 MED ORDER — FENTANYL CITRATE (PF) 100 MCG/2ML IJ SOLN
INTRAMUSCULAR | Status: AC
  Filled 2019-07-09: qty 4

## 2019-07-09 MED ORDER — 0.9 % SODIUM CHLORIDE (POUR BTL) OPTIME
TOPICAL | Status: DC | PRN
  Administered 2019-07-09: 2000 mL

## 2019-07-09 MED ORDER — FENTANYL CITRATE (PF) 100 MCG/2ML IJ SOLN
INTRAMUSCULAR | Status: DC | PRN
  Administered 2019-07-09: 50 ug via INTRAVENOUS
  Administered 2019-07-09: 25 ug via INTRAVENOUS
  Administered 2019-07-09 (×2): 50 ug via INTRAVENOUS
  Administered 2019-07-09: 25 ug via INTRAVENOUS
  Administered 2019-07-09: 50 ug via INTRAVENOUS

## 2019-07-09 MED ORDER — CEPHALEXIN 500 MG PO CAPS
500.0000 mg | ORAL_CAPSULE | Freq: Two times a day (BID) | ORAL | Status: DC
  Administered 2019-07-09 – 2019-07-11 (×5): 500 mg via ORAL
  Filled 2019-07-09 (×5): qty 1

## 2019-07-09 MED ORDER — ONDANSETRON HCL 4 MG/2ML IJ SOLN
INTRAMUSCULAR | Status: DC | PRN
  Administered 2019-07-09: 4 mg via INTRAVENOUS

## 2019-07-09 MED ORDER — OXYCODONE HCL 5 MG/5ML PO SOLN: 5.0000 mg | Freq: Once | ORAL | Status: AC | PRN

## 2019-07-09 MED ORDER — SODIUM CHLORIDE 0.9 % IR SOLN
Status: DC | PRN
  Administered 2019-07-09 (×2): 3000 mL via INTRAVESICAL
  Administered 2019-07-09: 24000 mL via INTRAVESICAL
  Administered 2019-07-09: 3000 mL via INTRAVESICAL

## 2019-07-09 MED ORDER — FAMOTIDINE 20 MG PO TABS
20.0000 mg | ORAL_TABLET | Freq: Every evening | ORAL | Status: DC
  Administered 2019-07-09 – 2019-07-10 (×2): 20 mg via ORAL
  Filled 2019-07-09 (×2): qty 1

## 2019-07-09 MED ORDER — OXYCODONE HCL 5 MG PO TABS
ORAL_TABLET | ORAL | Status: AC
  Filled 2019-07-09: qty 1

## 2019-07-09 MED ORDER — CHLORHEXIDINE GLUCONATE CLOTH 2 % EX PADS: 6.0000 | MEDICATED_PAD | Freq: Every day | CUTANEOUS | Status: DC

## 2019-07-09 MED ORDER — CEFAZOLIN SODIUM-DEXTROSE 2-4 GM/100ML-% IV SOLN
2.0000 g | INTRAVENOUS | Status: AC
  Administered 2019-07-09: 2 g via INTRAVENOUS
  Filled 2019-07-09: qty 100

## 2019-07-09 MED ORDER — FENTANYL CITRATE (PF) 100 MCG/2ML IJ SOLN
25.0000 ug | INTRAMUSCULAR | Status: DC | PRN
  Administered 2019-07-09 (×3): 50 ug via INTRAVENOUS

## 2019-07-09 MED ORDER — AMLODIPINE BESYLATE 10 MG PO TABS
10.0000 mg | ORAL_TABLET | Freq: Every day | ORAL | Status: DC
  Administered 2019-07-10 – 2019-07-11 (×2): 10 mg via ORAL
  Filled 2019-07-09 (×2): qty 1

## 2019-07-09 MED ORDER — SENNA 8.6 MG PO TABS
1.0000 | ORAL_TABLET | Freq: Two times a day (BID) | ORAL | Status: DC
  Administered 2019-07-09 – 2019-07-11 (×3): 8.6 mg via ORAL
  Filled 2019-07-09 (×4): qty 1

## 2019-07-09 MED ORDER — ONDANSETRON HCL 4 MG/2ML IJ SOLN: 4.0000 mg | Freq: Once | INTRAMUSCULAR | Status: DC | PRN

## 2019-07-09 MED ORDER — CHLORHEXIDINE GLUCONATE 0.12 % MT SOLN
15.0000 mL | Freq: Once | OROMUCOSAL | Status: AC
  Administered 2019-07-09: 15 mL via OROMUCOSAL

## 2019-07-09 MED ORDER — STERILE WATER FOR IRRIGATION IR SOLN
Status: DC | PRN
  Administered 2019-07-09: 500 mL

## 2019-07-09 MED ORDER — PROPOFOL 10 MG/ML IV BOLUS
INTRAVENOUS | Status: DC | PRN
  Administered 2019-07-09: 50 mg via INTRAVENOUS
  Administered 2019-07-09: 150 mg via INTRAVENOUS

## 2019-07-09 MED ORDER — IRBESARTAN 300 MG PO TABS
300.0000 mg | ORAL_TABLET | Freq: Every day | ORAL | Status: DC
  Administered 2019-07-10 – 2019-07-11 (×2): 300 mg via ORAL
  Filled 2019-07-09 (×2): qty 1

## 2019-07-09 MED ORDER — LIDOCAINE 2% (20 MG/ML) 5 ML SYRINGE
INTRAMUSCULAR | Status: DC | PRN
  Administered 2019-07-09: 60 mg via INTRAVENOUS

## 2019-07-09 MED ORDER — TADALAFIL 5 MG PO TABS
5.0000 mg | ORAL_TABLET | Freq: Every day | ORAL | Status: DC
  Administered 2019-07-10 – 2019-07-11 (×2): 5 mg via ORAL
  Filled 2019-07-09 (×2): qty 1

## 2019-07-09 SURGICAL SUPPLY — 20 items
BAG DRN RND TRDRP ANRFLXCHMBR (UROLOGICAL SUPPLIES) ×1
BAG URINE DRAIN 2000ML AR STRL (UROLOGICAL SUPPLIES) ×2 IMPLANT
BAG URO CATCHER STRL LF (MISCELLANEOUS) ×2 IMPLANT
BLADE SURG 15 STRL LF DISP TIS (BLADE) IMPLANT
BLADE SURG 15 STRL SS (BLADE)
CATH FOLEY 3WAY 30CC 22FR (CATHETERS) ×2 IMPLANT
CATH HEMA 3WAY 30CC 22FR COUDE (CATHETERS) ×1 IMPLANT
EVACUATOR MICROVAS BLADDER (UROLOGICAL SUPPLIES) ×2 IMPLANT
GLOVE BIOGEL M 8.0 STRL (GLOVE) ×2 IMPLANT
GOWN STRL REUS W/TWL XL LVL3 (GOWN DISPOSABLE) ×2 IMPLANT
HOLDER FOLEY CATH W/STRAP (MISCELLANEOUS) IMPLANT
KIT TURNOVER KIT A (KITS) IMPLANT
LOOP CUT BIPOLAR 24F LRG (ELECTROSURGICAL) ×3 IMPLANT
MANIFOLD NEPTUNE II (INSTRUMENTS) ×2 IMPLANT
PACK CYSTO (CUSTOM PROCEDURE TRAY) ×1 IMPLANT
PENCIL SMOKE EVACUATOR (MISCELLANEOUS) IMPLANT
SUT ETHILON 3 0 PS 1 (SUTURE) IMPLANT
SYR 30ML LL (SYRINGE) IMPLANT
TUBING CONNECTING 10 (TUBING) ×2 IMPLANT
TUBING UROLOGY SET (TUBING) ×2 IMPLANT

## 2019-07-09 NOTE — Interval H&P Note (Signed)
History and Physical Interval Note:  07/09/2019 7:24 AM  Jason Blackwell  has presented today for surgery, with the diagnosis of BENIGN PROSTATE HYPERPLASIA.  The various methods of treatment have been discussed with the patient and family. After consideration of risks, benefits and other options for treatment, the patient has consented to  Procedure(s): TRANSURETHRAL RESECTION OF THE PROSTATE (TURP) (N/A) as a surgical intervention.  The patient's history has been reviewed, patient examined, no change in status, stable for surgery.  I have reviewed the patient's chart and labs.  Questions were answered to the patient's satisfaction.     Lillette Boxer Tanmay Halteman

## 2019-07-09 NOTE — Discharge Instructions (Signed)
Transurethral Resection of the Prostate ° °Care After ° °Refer to this sheet in the next few weeks. These discharge instructions provide you with general information on caring for yourself after you leave the hospital. Your caregiver may also give you specific instructions. Your treatment has been planned according to the most current medical practices available, but unavoidable complications sometimes occur. If you have any problems or questions after discharge, please call your caregiver. ° °HOME CARE INSTRUCTIONS  ° °Medications °· You may receive medicine for pain management. As your level of discomfort decreases, adjustments in your pain medicines may be made.  °· Take all medicines as directed.  °· You may be given a medicine (antibiotic) to kill germs following surgery. Finish all medicines. Let your caregiver know if you have any side effects or problems from the medicine.  °· If you are on aspirin, it would be best not to restart the aspirin until the blood in the urine clears °Hygiene °· You can take a shower after surgery.  °· You should not take a bath while you still have the urethral catheter. °Activity °· You will be encouraged to get out of bed as much as possible and increase your activity level as tolerated.  °· Spend the first week in and around your home. For 3 weeks, avoid the following:  °· Straining.  °· Running.  °· Strenuous work.  °· Walks longer than a few blocks.  °· Riding for extended periods.  °· Sexual relations.  °· Do not lift heavy objects (more than 20 pounds) for at least 1 month. When lifting, use your arms instead of your abdominal muscles.  °· You will be encouraged to walk as tolerated. Do not exert yourself. Increase your activity level slowly. Remember that it is important to keep moving after an operation of any type. This cuts down on the possibility of developing blood clots.  °· Your caregiver will tell you when you can resume driving and light housework. Discuss this  at your first office visit after discharge. °Diet °· No special diet is ordered after a TURP. However, if you are on a special diet for another medical problem, it should be continued.  °· Normal fluid intake is usually recommended.  °· Avoid alcohol and caffeinated drinks for 2 weeks. They irritate the bladder. Decaffeinated drinks are okay.  °· Avoid spicy foods.  °Bladder Function °· For the first 10 days, empty the bladder whenever you feel a definite desire. Do not try to hold the urine for long periods of time.  °· Urinating once or twice a night even after you are healed is not uncommon.  °· You may see some recurrence of blood in the urine after discharge from the hospital. This usually happens within 2 weeks after the procedure.If this occurs, force fluids again as you did in the hospital and reduce your activity.  °Bowel Function °· You may experience some constipation after surgery. This can be minimized by increasing fluids and fiber in your diet. Drink enough water and fluids to keep your urine clear or pale yellow.  °· A stool softener may be prescribed for use at home. Do not strain to move your bowels.  °· If you are requiring increased pain medicine, it is important that you take stool softeners to prevent constipation. This will help to promote proper healing by reducing the need to strain to move your bowels.  °Sexual Activity °· Semen movement in the opposite direction and into the bladder (  retrograde ejaculation) may occur. Since the semen passes into the bladder, cloudy urine can occur the first time you urinate after intercourse. Or, you may not have an ejaculation during erection. Ask your caregiver when you can resume sexual activity. Retrograde ejaculation and reduced semen discharge should not reduce one's pleasure of intercourse.  °Postoperative Visit °· Arrange the date and time of your after surgery visit with your caregiver.  °Return to Work °· After your recovery is complete, you will  be able to return to work and resume all activities. Your caregiver will inform you when you can return to work.  °Foley Catheter Care °A soft, flexible tube (Foley catheter) may have been placed in your bladder to drain urine and fluid. Follow these instructions: °Taking Care of the Catheter °· Keep the area where the catheter leaves your body clean.  °· Attach the catheter to the leg so there is no tension on the catheter.  °· Keep the drainage bag below the level of the bladder, but keep it OFF the floor.  °· Do not take long soaking baths. Your caregiver will give instructions about showering.  °· Wash your hands before touching ANYTHING related to the catheter or bag.  °· Using mild soap and warm water on a washcloth:  °· Clean the area closest to the catheter insertion site using a circular motion around the catheter.  °· Clean the catheter itself by wiping AWAY from the insertion site for several inches down the tube.  °· NEVER wipe upward as this could sweep bacteria up into the urethra (tube in your body that normally drains the bladder) and cause infection.  °· Place a small amount of sterile lubricant at the tip of the penis where the catheter is entering.  °Taking Care of the Drainage Bags °· Two drainage bags may be taken home: a large overnight drainage bag, and a smaller leg bag which fits underneath clothing.  °· It is okay to wear the overnight bag at any time, but NEVER wear the smaller leg bag at night.  °· Keep the drainage bag well below the level of your bladder. This prevents backflow of urine into the bladder and allows the urine to drain freely.  °· Anchor the tubing to your leg to prevent pulling or tension on the catheter. Use tape or a leg strap provided by the hospital.  °· Empty the drainage bag when it is 1/2 to 3/4 full. Wash your hands before and after touching the bag.  °· Periodically check the tubing for kinks to make sure there is no pressure on the tubing which could restrict  the flow of urine.  °Changing the Drainage Bags °· Cleanse both ends of the clean bag with alcohol before changing.  °· Pinch off the rubber catheter to avoid urine spillage during the disconnection.  °· Disconnect the dirty bag and connect the clean one.  °· Empty the dirty bag carefully to avoid a urine spill.  °· Attach the new bag to the leg with tape or a leg strap.  °Cleaning the Drainage Bags °· Whenever a drainage bag is disconnected, it must be cleaned quickly so it is ready for the next use.  °· Wash the bag in warm, soapy water.  °· Rinse the bag thoroughly with warm water.  °· Soak the bag for 30 minutes in a solution of white vinegar and water (1 cup vinegar to 1 quart warm water).  °· Rinse with warm water.  °SEEK MEDICAL   CARE IF:  °· You have chills or night sweats.  °· You are leaking around your catheter or have problems with your catheter. It is not uncommon to have sporadic leakage around your catheter as a result of bladder spasms. If the leakage stops, there is not much need for concern. If you are uncertain, call your caregiver.  °· You develop side effects that you think are coming from your medicines.  °SEEK IMMEDIATE MEDICAL CARE IF:  °· You are suddenly unable to urinate. Check to see if there are any kinks in the drainage tubing that may cause this. If you cannot find any kinks, call your caregiver immediately. This is an emergency.  °· You develop shortness of breath or chest pains.  °· Bleeding persists or clots develop in your urine.  °· You have a fever.  °· You develop pain in your back or over your lower belly (abdomen).  °· You develop pain or swelling in your legs.  °· Any problems you are having get worse rather than better.  °MAKE SURE YOU:  °· Understand these instructions.  °· Will watch your condition.  °· Will get help right away if you are not doing well or get worse.  °Document Released: 01/01/2005 Document Revised: 09/13/2010 Document Reviewed: 08/25/2008 °[employer]®  Patient Information ©2012 [employer].Transurethral Resection of the Prostate °Care After °Refer to this sheet in the next few weeks. These discharge instructions provide you with general information on caring for yourself after you leave the hospital. Your caregiver may also give you specific instructions. Your treatment has been planned according to the most current medical practices available, but unavoidable complications sometimes occur. If you have any problems or questions after discharge, please call your caregiver. °

## 2019-07-09 NOTE — Anesthesia Procedure Notes (Signed)
Procedure Name: LMA Insertion Date/Time: 07/09/2019 7:40 AM Performed by: Niel Hummer, CRNA Pre-anesthesia Checklist: Patient identified, Emergency Drugs available, Suction available and Patient being monitored Patient Re-evaluated:Patient Re-evaluated prior to induction Oxygen Delivery Method: Circle system utilized Preoxygenation: Pre-oxygenation with 100% oxygen Induction Type: IV induction Ventilation: Mask ventilation without difficulty LMA: LMA with gastric port inserted LMA Size: 5.0 Dental Injury: Teeth and Oropharynx as per pre-operative assessment  Comments: Easy mask ventilation.

## 2019-07-09 NOTE — Anesthesia Postprocedure Evaluation (Signed)
Anesthesia Post Note  Patient: Jason Blackwell  Procedure(s) Performed: TRANSURETHRAL RESECTION OF THE PROSTATE (TURP) (N/A )     Patient location during evaluation: PACU Anesthesia Type: General Level of consciousness: sedated and patient cooperative Pain management: pain level controlled Vital Signs Assessment: post-procedure vital signs reviewed and stable Respiratory status: spontaneous breathing Cardiovascular status: stable Anesthetic complications: no   No complications documented.  Last Vitals:  Vitals:   07/09/19 1200 07/09/19 1314  BP: (!) 112/58 (!) 148/84  Pulse: (!) 57 65  Resp: 12 20  Temp:  36.6 C  SpO2: 100% 100%    Last Pain:  Vitals:   07/09/19 1355  TempSrc:   PainSc: [place]

## 2019-07-09 NOTE — Op Note (Signed)
Preoperative diagnosis: 1. Bladder outlet obstruction secondary to BPH  Postoperative diagnosis:  1. Bladder outlet obstruction secondary to BPH  Procedure:  1. Cystoscopy 2. Transurethral resection of the prostate  Surgeon: Lillette Boxer. Luke Falero, M.D.  Anesthesia: General  Complications: None  Drain: Foley catheter  EBL: Minimal  Specimens: 1. 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 His prostate size is >150 ml. After reviewing the management options for treatment, he elected to proceed with the above surgical procedure(s). We have discussed the potential benefits and risks of the procedure, side effects of the proposed treatment, the likelihood of the patient achieving the goals of the procedure, and any potential problems that might occur during the procedure or recuperation. Informed consent has been obtained.  Description of procedure:  The patient was identified in the holding area. He received preoperative antibiotics. He was then taken to the operating room. General anesthetic was administered.  The patient was then placed in the dorsal lithotomy position, prepped and draped in the usual sterile fashion. Timeout was then performed.  A resectoscope sheath was placed using the visual obturator.   The bladder was then systematically examined in its entirety. There was no evidence of  tumors, stones, or other mucosal pathology.The resectoscope, loop and telescope were then placed.  The ureteral orifices were identified so as to be avoided during the procedure.  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  The prostate adenoma from the bladder neck back to the verumontanum was resected beginning at the six o'clock position and then extended to include the right and left lobes of the  prostate and anterior prostate, respectively. Care was taken not to resect distal to the verumontanum.  Hemostasis was then achieved with the cautery and the bladder was emptied and reinspected with no significant bleeding noted at the end of the procedure.  Resected chips were irrigated from the bladder with the evacuator and sent to pathology.  A 3 way catheter was then placed into the bladder and placed on continuous bladder irrigation.  The patient appeared to tolerate the procedure well and without complications. The patient was able to be awakened and transferred to the recovery unit in satisfactory condition. He tolerated the procedure well.

## 2019-07-09 NOTE — Transfer of Care (Signed)
Immediate Anesthesia Transfer of Care Note  Patient: Jason Blackwell  Procedure(s) Performed: TRANSURETHRAL RESECTION OF THE PROSTATE (TURP) (N/A )  Patient Location: PACU  Anesthesia Type:General  Level of Consciousness: awake, alert  and oriented  Airway & Oxygen Therapy: Patient Spontanous Breathing and Patient connected to face mask oxygen  Post-op Assessment: Report given to RN, Post -op Vital signs reviewed and stable and Patient moving all extremities X 4  Post vital signs: Reviewed and stable  Last Vitals:  Vitals Value Taken Time  BP    Temp    Pulse 69 07/09/19 0938  Resp 20 07/09/19 0938  SpO2 100 % 07/09/19 0938  Vitals shown include unvalidated device data.  Last Pain:  Vitals:   07/09/19 2992  TempSrc:   PainSc: 0-No pain         Complications: No complications documented.

## 2019-07-10 ENCOUNTER — Encounter (HOSPITAL_COMMUNITY): Payer: Self-pay | Admitting: Urology

## 2019-07-10 DIAGNOSIS — N401 Enlarged prostate with lower urinary tract symptoms: Secondary | ICD-10-CM | POA: Diagnosis not present

## 2019-07-10 LAB — SURGICAL PATHOLOGY

## 2019-07-10 MED ORDER — CEPHALEXIN 500 MG PO CAPS: 500.0000 mg | ORAL_CAPSULE | Freq: Two times a day (BID) | ORAL | 0 refills | Status: DC

## 2019-07-10 MED ORDER — CHLORHEXIDINE GLUCONATE CLOTH 2 % EX PADS
6.0000 | MEDICATED_PAD | Freq: Every day | CUTANEOUS | Status: DC
  Administered 2019-07-10 – 2019-07-11 (×2): 6 via TOPICAL

## 2019-07-10 NOTE — Progress Notes (Signed)
1 Day Post-Op Subjective: Patient reports no problems overnight.  He has voided a little bit already.  No discomfort.  Objective: Vital signs in last 24 hours: Temp:  [97.5 F (36.4 C)-98.4 F (36.9 C)] 98.1 F (36.7 C) (06/25 0514) Pulse Rate:  [57-73] 73 (06/25 0514) Resp:  [11-20] 12 (06/25 0514) BP: (102-148)/(54-84) 116/80 (06/25 0514) SpO2:  [97 %-100 %] 97 % (06/25 0514) Weight:  [440 kg] 109 kg (06/24 1747)  Intake/Output from previous day: 06/24 0701 - 06/25 0700 In: 9625.9 [P.O.:120; I.V.:1805.9; IV Piggyback:100] Out: 34742 [Urine:12925; Blood:300] Intake/Output this shift: No intake/output data recorded.  Physical Exam:  Constitutional: Vital signs reviewed. WD WN in NAD   Eyes: PERRL, No scleral icterus.   Cardiovascular: RRR Pulmonary/Chest: Normal effort Extremities: No cyanosis or edema   Lab Results: No results for input(s): HGB, HCT in the last 72 hours. BMET No results for input(s): NA, K, CL, CO2, GLUCOSE, BUN, CREATININE, CALCIUM in the last 72 hours. No results for input(s): LABPT, INR in the last 72 hours. No results for input(s): LABURIN in the last 72 hours. Results for orders placed or performed during the hospital encounter of 07/06/19  SARS CORONAVIRUS 2 (TAT 6-24 HRS) Nasopharyngeal Nasopharyngeal Swab     Status: None   Collection Time: 07/06/19  2:11 PM   Specimen: Nasopharyngeal Swab  Result Value Ref Range Status   SARS Coronavirus 2 NEGATIVE NEGATIVE Final    Comment: (NOTE) SARS-CoV-2 target nucleic acids are NOT DETECTED.  The SARS-CoV-2 RNA is generally detectable in upper and lower respiratory specimens during the acute phase of infection. Negative results do not preclude SARS-CoV-2 infection, do not rule out co-infections with other pathogens, and should not be used as the sole basis for treatment or other patient management decisions. Negative results must be combined with clinical observations, patient history, and  epidemiological information. The expected result is Negative.  Fact Sheet for Patients: SugarRoll.be  Fact Sheet for Healthcare Providers: https://www.woods-mathews.com/  This test is not yet approved or cleared by the [place] FDA and  has been authorized for detection and/or diagnosis of SARS-CoV-2 by FDA under an Emergency Use Authorization (EUA). This EUA will remain  in effect (meaning this test can be used) for the duration of the COVID-19 declaration under Se ction 564(b)(1) of the Act, 21 U.S.C. section 360bbb-3(b)(1), unless the authorization is terminated or revoked sooner.  Performed at [facility], [place] [address], [place], [place] [address]     Studies/Results: No results found.  Assessment/Plan:   Postoperative morning #1.  He is voided a little bit.  We will wait until another void to declare whether to let him go without catheter or to replace.    Patient was given discharge instructions.   LOS: 0 days   Jorja Loa 07/10/2019, 7:59 AM

## 2019-07-11 DIAGNOSIS — N401 Enlarged prostate with lower urinary tract symptoms: Secondary | ICD-10-CM | POA: Diagnosis not present

## 2019-07-11 MED ORDER — HYDROCODONE-ACETAMINOPHEN 5-325 MG PO TABS: 1.0000 | ORAL_TABLET | ORAL | 0 refills | Status: DC | PRN

## 2019-07-11 NOTE — Plan of Care (Signed)

## 2019-07-11 NOTE — Progress Notes (Signed)
Patient and his spouse given discharge, follow up, foley care, and medication instructions, verbalized understanding, IV removed, Foley supplies and personal belongings with patient, family to transport home

## 2019-07-11 NOTE — Discharge Summary (Signed)
Physician Discharge Summary  Patient ID: Jason Blackwell MRN: [id number] DOB/AGE: 02/19/1945 74 y.o.  Admit date: 07/09/2019 Discharge date: 07/11/2019  Admission Diagnoses:  Discharge Diagnoses:  Active Problems:   Enlarged prostate with urinary obstruction   Discharged Condition: good  Hospital Course: 74 year old male underwent TURP on 07/09/2019.  He was weaned off CBI but failed his voiding trial.  He remained under observation for another day.  The following day, Foley catheter was draining clear yellow urine.  He was stable for discharge with Foley catheter.  Consults: None  Significant Diagnostic Studies: None  Treatments: surgery: TURP  Discharge Exam: Blood pressure 118/73, pulse 65, temperature 98.4 F (36.9 C), temperature source Oral, resp. rate 18, height 5\' 11"  (1.803 m), weight 109 kg, SpO2 98 %. General appearance: alert, no acute distress Adequate perfusion of extremities Nonlabored respiration Abdomen soft, nontender, nondistended Foley catheter draining clear yellow urine  Disposition: Discharge disposition: 01-Home or Self Care       Discharge Instructions    No wound care   Complete by: As directed      Allergies as of 07/11/2019      Reactions   Simvastatin Other (See Comments)   Severe muscle fatigue ALL STATINS   Succinylcholine Chloride Other (See Comments)   Respiratory problems   Sulfa Antibiotics Itching   Sulfamethoxazole Itching          Medication List    STOP taking these medications   aspirin EC 81 MG tablet     TAKE these medications   acetaminophen 500 MG tablet Commonly known as: TYLENOL Take 1,000 mg by mouth See admin instructions. Take 2 tablets (1000 mg) by mouth in the morning (scheduled) & may take an additional 2 tablets (1000 mg) by mouth if needed for pain.   amLODipine 10 MG tablet Commonly known as: NORVASC Take 10 mg by mouth daily.   cephALEXin 500 MG capsule Commonly known as: Keflex Take 1  capsule (500 mg total) by mouth 2 (two) times daily.   chlorthalidone 25 MG tablet Commonly known as: HYGROTON TAKE 1 TABLET (25 MG TOTAL) BY MOUTH DAILY. What changed: See the new instructions.   famotidine 20 MG tablet Commonly known as: PEPCID Take 20 mg by mouth every evening.   HYDROcodone-acetaminophen 5-325 MG tablet Commonly known as: NORCO/VICODIN Take 1-2 tablets by mouth every 4 (four) hours as needed for moderate pain.   OVER THE COUNTER MEDICATION Take 1 Scoop by mouth daily. Super beets   Repatha SureClick 086 MG/ML Soaj Generic drug: Evolocumab Inject 140 mg into the skin every 14 (fourteen) days.   tadalafil 5 MG tablet Commonly known as: CIALIS Take 5 mg by mouth daily.   telmisartan 80 MG tablet Commonly known as: MICARDIS Take 80 mg by mouth daily.       Follow-up Information    Karen Kays, NP Follow up.   Specialty: Nurse Practitioner Why: 08/20/2019 @ 10:30 Contact information: [address] [place] [address] [phone]               Signed: Marton Redwood, III 07/11/2019, 11:54 AM

## 2019-07-15 DIAGNOSIS — I10 Essential (primary) hypertension: Secondary | ICD-10-CM | POA: Diagnosis not present

## 2019-07-15 DIAGNOSIS — I2510 Atherosclerotic heart disease of native coronary artery without angina pectoris: Secondary | ICD-10-CM | POA: Diagnosis not present

## 2019-07-15 DIAGNOSIS — E7800 Pure hypercholesterolemia, unspecified: Secondary | ICD-10-CM | POA: Diagnosis not present

## 2019-07-15 DIAGNOSIS — N400 Enlarged prostate without lower urinary tract symptoms: Secondary | ICD-10-CM | POA: Diagnosis not present

## 2019-08-05 DIAGNOSIS — I2510 Atherosclerotic heart disease of native coronary artery without angina pectoris: Secondary | ICD-10-CM | POA: Diagnosis not present

## 2019-08-05 DIAGNOSIS — N400 Enlarged prostate without lower urinary tract symptoms: Secondary | ICD-10-CM | POA: Diagnosis not present

## 2019-08-05 DIAGNOSIS — I10 Essential (primary) hypertension: Secondary | ICD-10-CM | POA: Diagnosis not present

## 2019-08-05 DIAGNOSIS — E7800 Pure hypercholesterolemia, unspecified: Secondary | ICD-10-CM | POA: Diagnosis not present

## 2019-08-10 DIAGNOSIS — R300 Dysuria: Secondary | ICD-10-CM | POA: Diagnosis not present

## 2019-08-10 DIAGNOSIS — R351 Nocturia: Secondary | ICD-10-CM | POA: Diagnosis not present

## 2019-08-10 DIAGNOSIS — N401 Enlarged prostate with lower urinary tract symptoms: Secondary | ICD-10-CM | POA: Diagnosis not present

## 2019-08-20 DIAGNOSIS — R3915 Urgency of urination: Secondary | ICD-10-CM | POA: Diagnosis not present

## 2019-08-20 DIAGNOSIS — R350 Frequency of micturition: Secondary | ICD-10-CM | POA: Diagnosis not present

## 2019-09-11 DIAGNOSIS — N400 Enlarged prostate without lower urinary tract symptoms: Secondary | ICD-10-CM | POA: Diagnosis not present

## 2019-09-11 DIAGNOSIS — I10 Essential (primary) hypertension: Secondary | ICD-10-CM | POA: Diagnosis not present

## 2019-09-11 DIAGNOSIS — I2510 Atherosclerotic heart disease of native coronary artery without angina pectoris: Secondary | ICD-10-CM | POA: Diagnosis not present

## 2019-09-11 DIAGNOSIS — E7800 Pure hypercholesterolemia, unspecified: Secondary | ICD-10-CM | POA: Diagnosis not present

## 2019-09-17 DIAGNOSIS — E7800 Pure hypercholesterolemia, unspecified: Secondary | ICD-10-CM | POA: Diagnosis not present

## 2019-09-17 DIAGNOSIS — N400 Enlarged prostate without lower urinary tract symptoms: Secondary | ICD-10-CM | POA: Diagnosis not present

## 2019-09-17 DIAGNOSIS — I10 Essential (primary) hypertension: Secondary | ICD-10-CM | POA: Diagnosis not present

## 2019-09-17 DIAGNOSIS — I2510 Atherosclerotic heart disease of native coronary artery without angina pectoris: Secondary | ICD-10-CM | POA: Diagnosis not present

## 2019-09-22 DIAGNOSIS — R3915 Urgency of urination: Secondary | ICD-10-CM | POA: Diagnosis not present

## 2019-09-22 DIAGNOSIS — N401 Enlarged prostate with lower urinary tract symptoms: Secondary | ICD-10-CM | POA: Diagnosis not present

## 2019-09-22 DIAGNOSIS — R350 Frequency of micturition: Secondary | ICD-10-CM | POA: Diagnosis not present

## 2019-09-24 DIAGNOSIS — Z23 Encounter for immunization: Secondary | ICD-10-CM | POA: Diagnosis not present

## 2019-09-24 DIAGNOSIS — I10 Essential (primary) hypertension: Secondary | ICD-10-CM | POA: Diagnosis not present

## 2019-10-20 DIAGNOSIS — M1711 Unilateral primary osteoarthritis, right knee: Secondary | ICD-10-CM | POA: Diagnosis not present

## 2019-10-23 ENCOUNTER — Ambulatory Visit: Payer: PPO | Attending: Internal Medicine

## 2019-10-23 DIAGNOSIS — Z23 Encounter for immunization: Secondary | ICD-10-CM

## 2019-10-23 NOTE — Progress Notes (Signed)
° °  Covid-19 Vaccination Clinic  Name:  [name]    MRN: [id number] DOB: [birth year]  10/23/2019  Jason Blackwell was observed post Covid-19 immunization for 15 minutes without incident. He was provided with Vaccine Information Sheet and instruction to access the V-Safe system.   Mr. Lupinacci was instructed to call 911 with any severe reactions post vaccine:  Difficulty breathing   Swelling of face and throat   A fast heartbeat   A bad rash all over body   Dizziness and weakness

## 2019-11-03 DIAGNOSIS — Z85828 Personal history of other malignant neoplasm of skin: Secondary | ICD-10-CM | POA: Diagnosis not present

## 2019-11-03 DIAGNOSIS — C44212 Basal cell carcinoma of skin of right ear and external auricular canal: Secondary | ICD-10-CM | POA: Diagnosis not present

## 2019-11-03 DIAGNOSIS — D2271 Melanocytic nevi of right lower limb, including hip: Secondary | ICD-10-CM | POA: Diagnosis not present

## 2019-11-03 DIAGNOSIS — B078 Other viral warts: Secondary | ICD-10-CM | POA: Diagnosis not present

## 2019-11-03 DIAGNOSIS — D485 Neoplasm of uncertain behavior of skin: Secondary | ICD-10-CM | POA: Diagnosis not present

## 2019-11-03 DIAGNOSIS — D2262 Melanocytic nevi of left upper limb, including shoulder: Secondary | ICD-10-CM | POA: Diagnosis not present

## 2019-11-03 DIAGNOSIS — D2261 Melanocytic nevi of right upper limb, including shoulder: Secondary | ICD-10-CM | POA: Diagnosis not present

## 2019-11-03 DIAGNOSIS — D225 Melanocytic nevi of trunk: Secondary | ICD-10-CM | POA: Diagnosis not present

## 2019-11-03 DIAGNOSIS — L728 Other follicular cysts of the skin and subcutaneous tissue: Secondary | ICD-10-CM | POA: Diagnosis not present

## 2019-11-03 DIAGNOSIS — X32XXXA Exposure to sunlight, initial encounter: Secondary | ICD-10-CM | POA: Diagnosis not present

## 2019-11-03 DIAGNOSIS — C44319 Basal cell carcinoma of skin of other parts of face: Secondary | ICD-10-CM | POA: Diagnosis not present

## 2019-11-03 DIAGNOSIS — L570 Actinic keratosis: Secondary | ICD-10-CM | POA: Diagnosis not present

## 2019-11-03 DIAGNOSIS — C434 Malignant melanoma of scalp and neck: Secondary | ICD-10-CM | POA: Diagnosis not present

## 2019-11-11 DIAGNOSIS — C434 Malignant melanoma of scalp and neck: Secondary | ICD-10-CM | POA: Insufficient documentation

## 2019-11-13 DIAGNOSIS — N400 Enlarged prostate without lower urinary tract symptoms: Secondary | ICD-10-CM | POA: Diagnosis not present

## 2019-11-13 DIAGNOSIS — I2510 Atherosclerotic heart disease of native coronary artery without angina pectoris: Secondary | ICD-10-CM | POA: Diagnosis not present

## 2019-11-13 DIAGNOSIS — I10 Essential (primary) hypertension: Secondary | ICD-10-CM | POA: Diagnosis not present

## 2019-11-13 DIAGNOSIS — D485 Neoplasm of uncertain behavior of skin: Secondary | ICD-10-CM | POA: Diagnosis not present

## 2019-11-13 DIAGNOSIS — E7800 Pure hypercholesterolemia, unspecified: Secondary | ICD-10-CM | POA: Diagnosis not present

## 2019-11-19 DIAGNOSIS — C434 Malignant melanoma of scalp and neck: Secondary | ICD-10-CM | POA: Diagnosis not present

## 2019-11-19 DIAGNOSIS — G720 Drug-induced myopathy: Secondary | ICD-10-CM | POA: Diagnosis not present

## 2019-11-19 DIAGNOSIS — Z789 Other specified health status: Secondary | ICD-10-CM | POA: Diagnosis not present

## 2019-11-19 DIAGNOSIS — I10 Essential (primary) hypertension: Secondary | ICD-10-CM | POA: Diagnosis not present

## 2019-11-23 DIAGNOSIS — L728 Other follicular cysts of the skin and subcutaneous tissue: Secondary | ICD-10-CM | POA: Diagnosis not present

## 2019-11-23 DIAGNOSIS — L720 Epidermal cyst: Secondary | ICD-10-CM | POA: Diagnosis not present

## 2019-11-23 DIAGNOSIS — R208 Other disturbances of skin sensation: Secondary | ICD-10-CM | POA: Diagnosis not present

## 2019-11-23 DIAGNOSIS — L538 Other specified erythematous conditions: Secondary | ICD-10-CM | POA: Diagnosis not present

## 2019-12-09 DIAGNOSIS — I10 Essential (primary) hypertension: Secondary | ICD-10-CM | POA: Diagnosis not present

## 2019-12-09 DIAGNOSIS — I2510 Atherosclerotic heart disease of native coronary artery without angina pectoris: Secondary | ICD-10-CM | POA: Diagnosis not present

## 2019-12-09 DIAGNOSIS — N400 Enlarged prostate without lower urinary tract symptoms: Secondary | ICD-10-CM | POA: Diagnosis not present

## 2019-12-09 DIAGNOSIS — K219 Gastro-esophageal reflux disease without esophagitis: Secondary | ICD-10-CM | POA: Diagnosis not present

## 2019-12-09 DIAGNOSIS — E7800 Pure hypercholesterolemia, unspecified: Secondary | ICD-10-CM | POA: Diagnosis not present

## 2019-12-12 DIAGNOSIS — Z20822 Contact with and (suspected) exposure to covid-19: Secondary | ICD-10-CM | POA: Diagnosis not present

## 2019-12-12 DIAGNOSIS — Z01812 Encounter for preprocedural laboratory examination: Secondary | ICD-10-CM | POA: Diagnosis not present

## 2019-12-15 DIAGNOSIS — I10 Essential (primary) hypertension: Secondary | ICD-10-CM | POA: Diagnosis not present

## 2019-12-15 DIAGNOSIS — N400 Enlarged prostate without lower urinary tract symptoms: Secondary | ICD-10-CM | POA: Diagnosis not present

## 2019-12-15 DIAGNOSIS — E7800 Pure hypercholesterolemia, unspecified: Secondary | ICD-10-CM | POA: Diagnosis not present

## 2019-12-15 DIAGNOSIS — E785 Hyperlipidemia, unspecified: Secondary | ICD-10-CM | POA: Diagnosis not present

## 2019-12-15 DIAGNOSIS — Z882 Allergy status to sulfonamides status: Secondary | ICD-10-CM | POA: Diagnosis not present

## 2019-12-15 DIAGNOSIS — Z01818 Encounter for other preprocedural examination: Secondary | ICD-10-CM | POA: Diagnosis not present

## 2019-12-15 DIAGNOSIS — Z955 Presence of coronary angioplasty implant and graft: Secondary | ICD-10-CM | POA: Diagnosis not present

## 2019-12-15 DIAGNOSIS — C434 Malignant melanoma of scalp and neck: Secondary | ICD-10-CM | POA: Diagnosis not present

## 2019-12-15 DIAGNOSIS — Z7982 Long term (current) use of aspirin: Secondary | ICD-10-CM | POA: Diagnosis not present

## 2019-12-15 DIAGNOSIS — Z85828 Personal history of other malignant neoplasm of skin: Secondary | ICD-10-CM | POA: Diagnosis not present

## 2019-12-23 DIAGNOSIS — C434 Malignant melanoma of scalp and neck: Secondary | ICD-10-CM | POA: Diagnosis not present

## 2019-12-28 IMAGING — CR RIGHT KNEE - COMPLETE 4+ VIEW
4 series · 4 of 4 positions shown · non-contrast
Comparison: None.

CLINICAL DATA: Right knee pain.

EXAM:
RIGHT KNEE - COMPLETE 4+ VIEW

[knee ap]
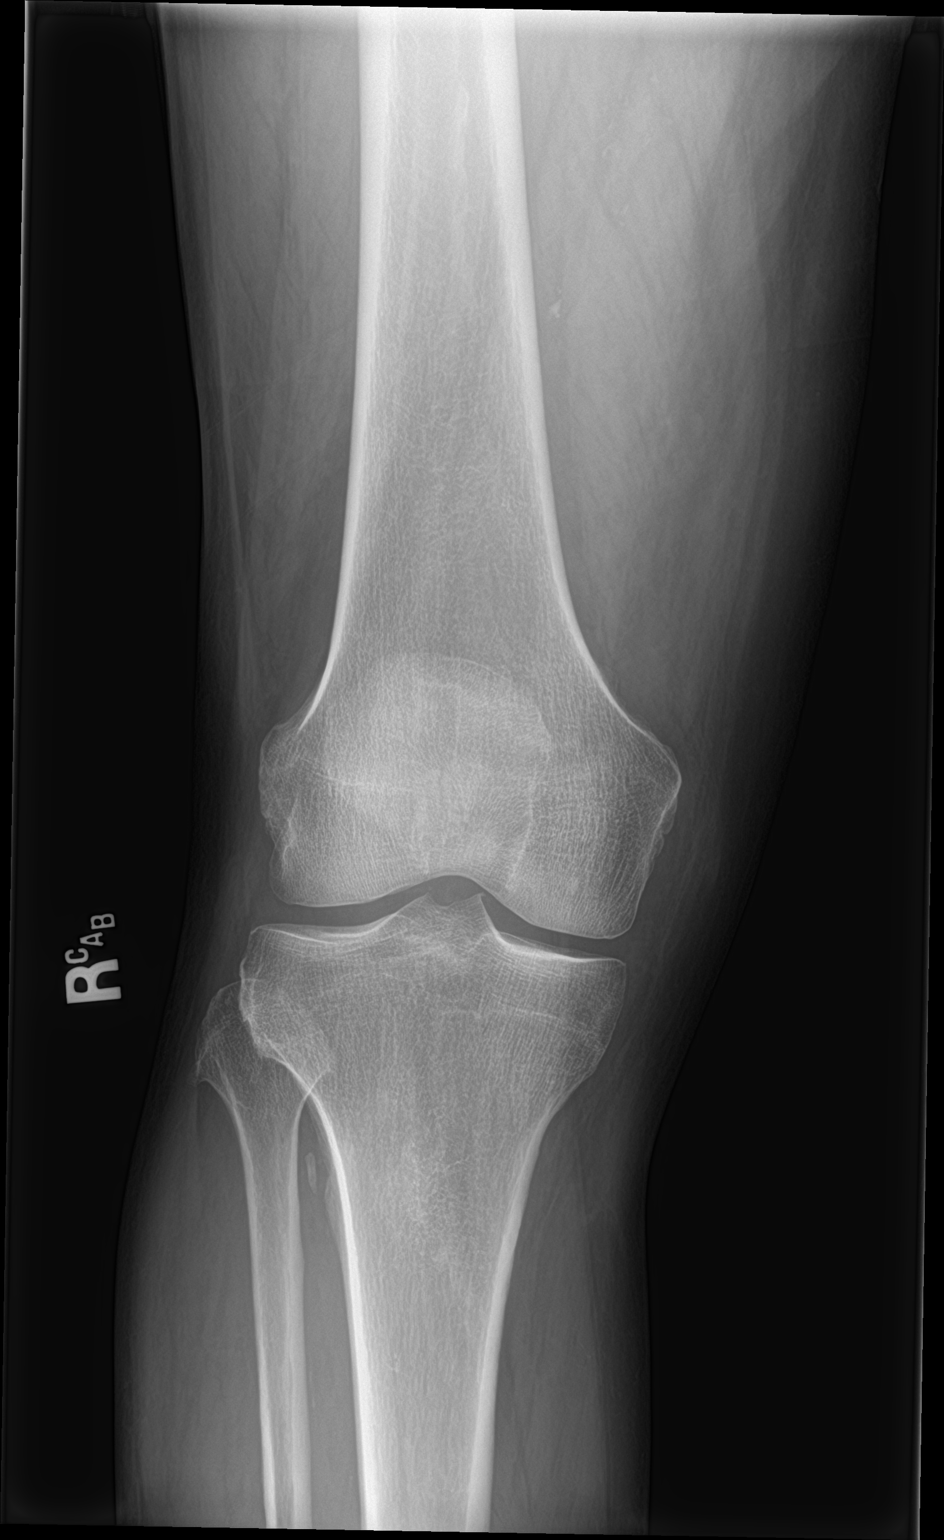

[knee obl (1 of 2)]
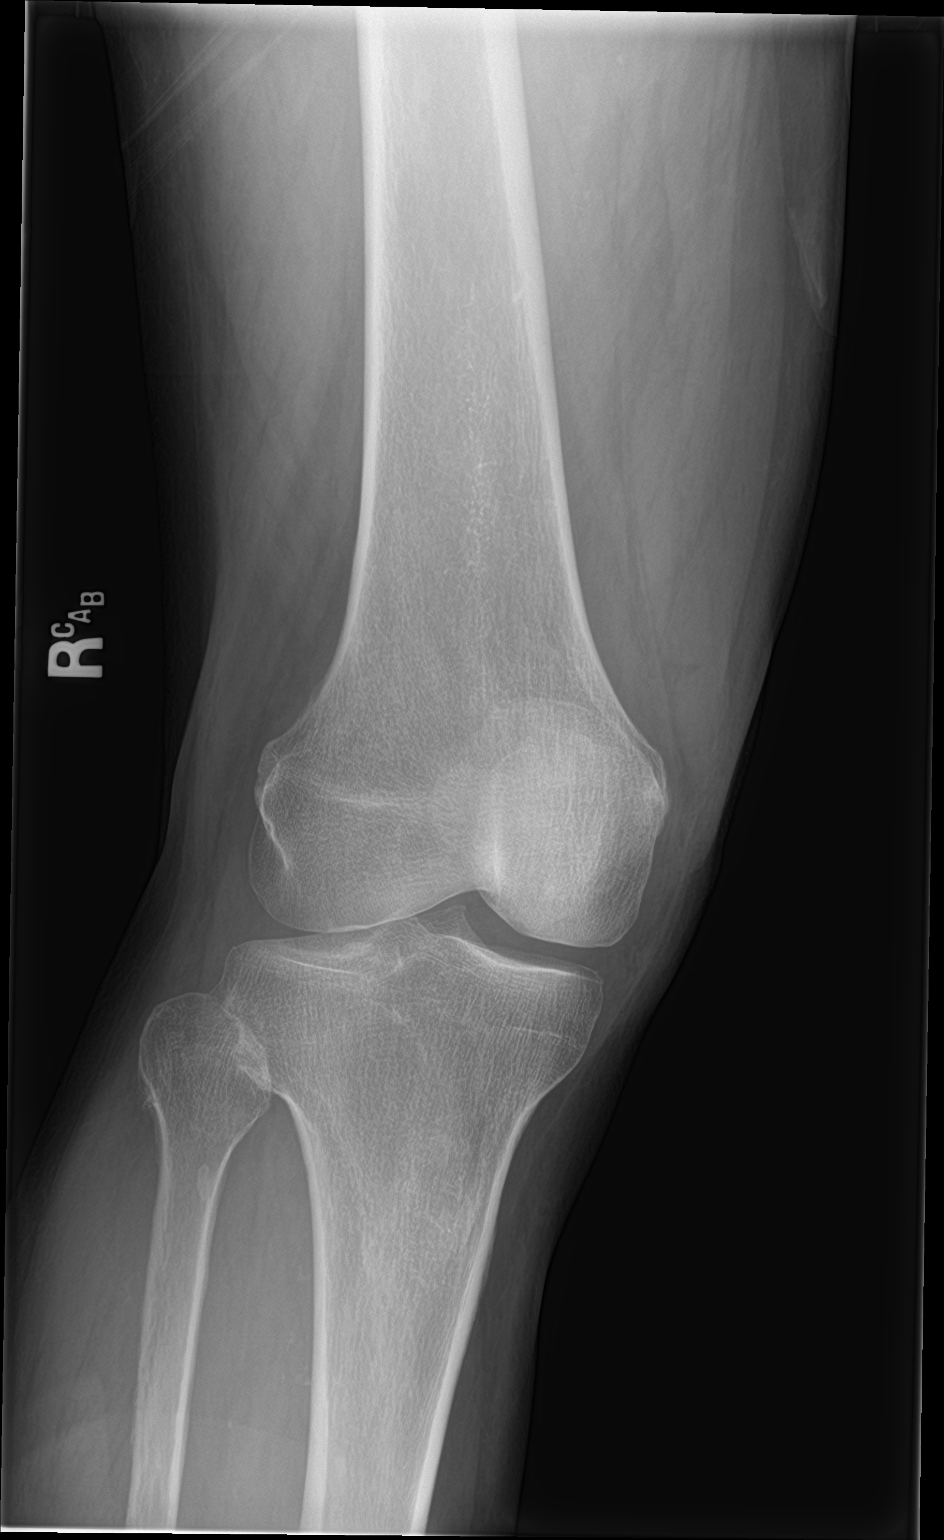

[knee obl (2 of 2)]
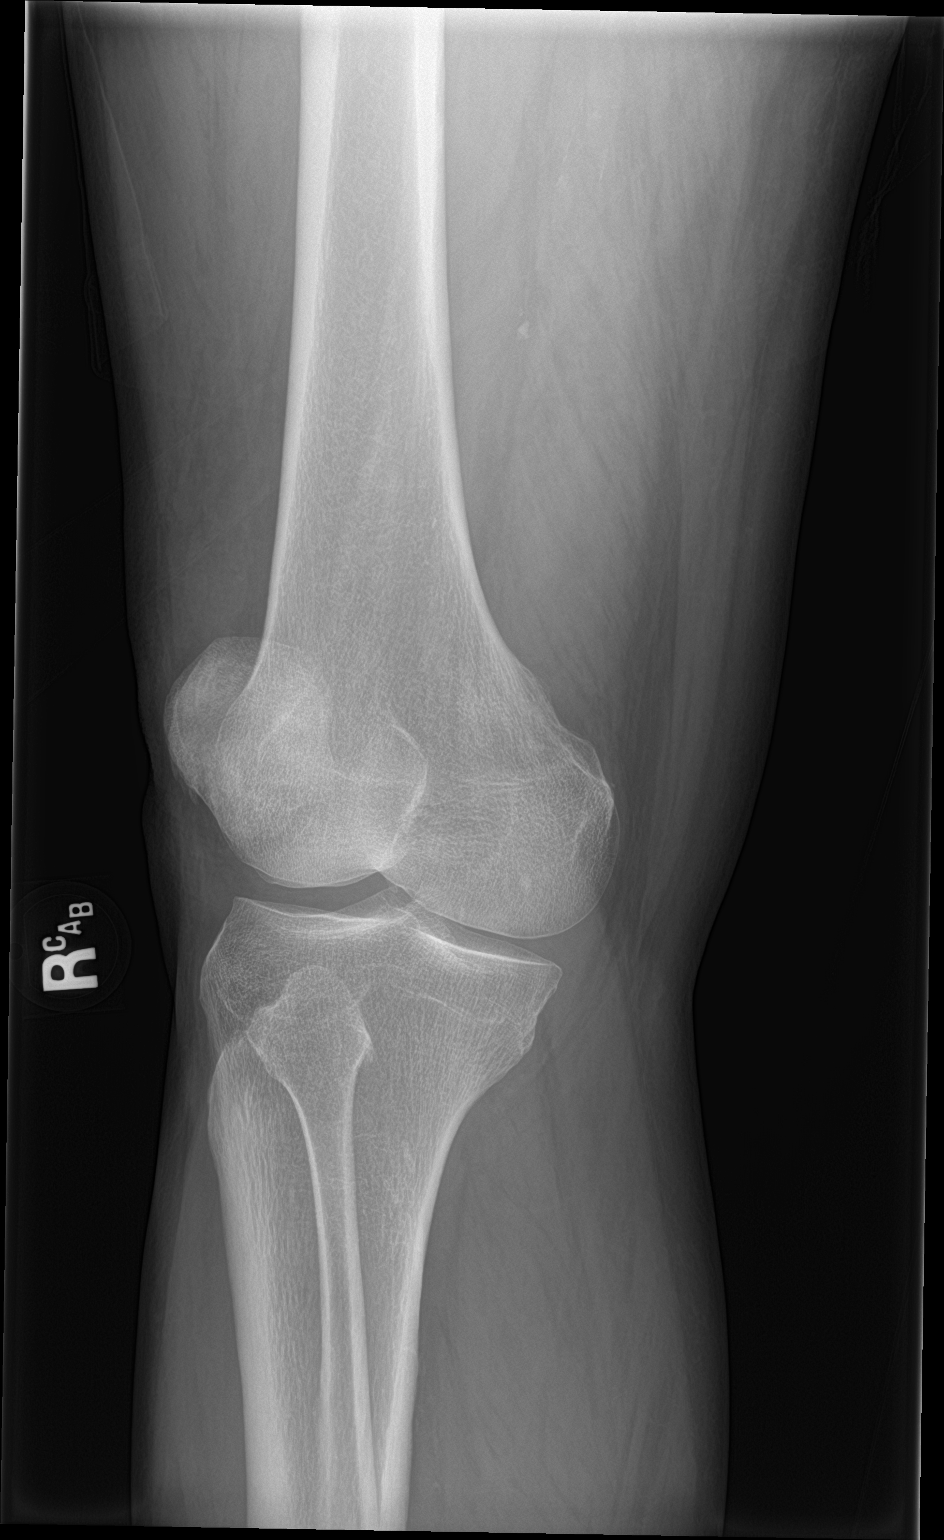

[knee lat]
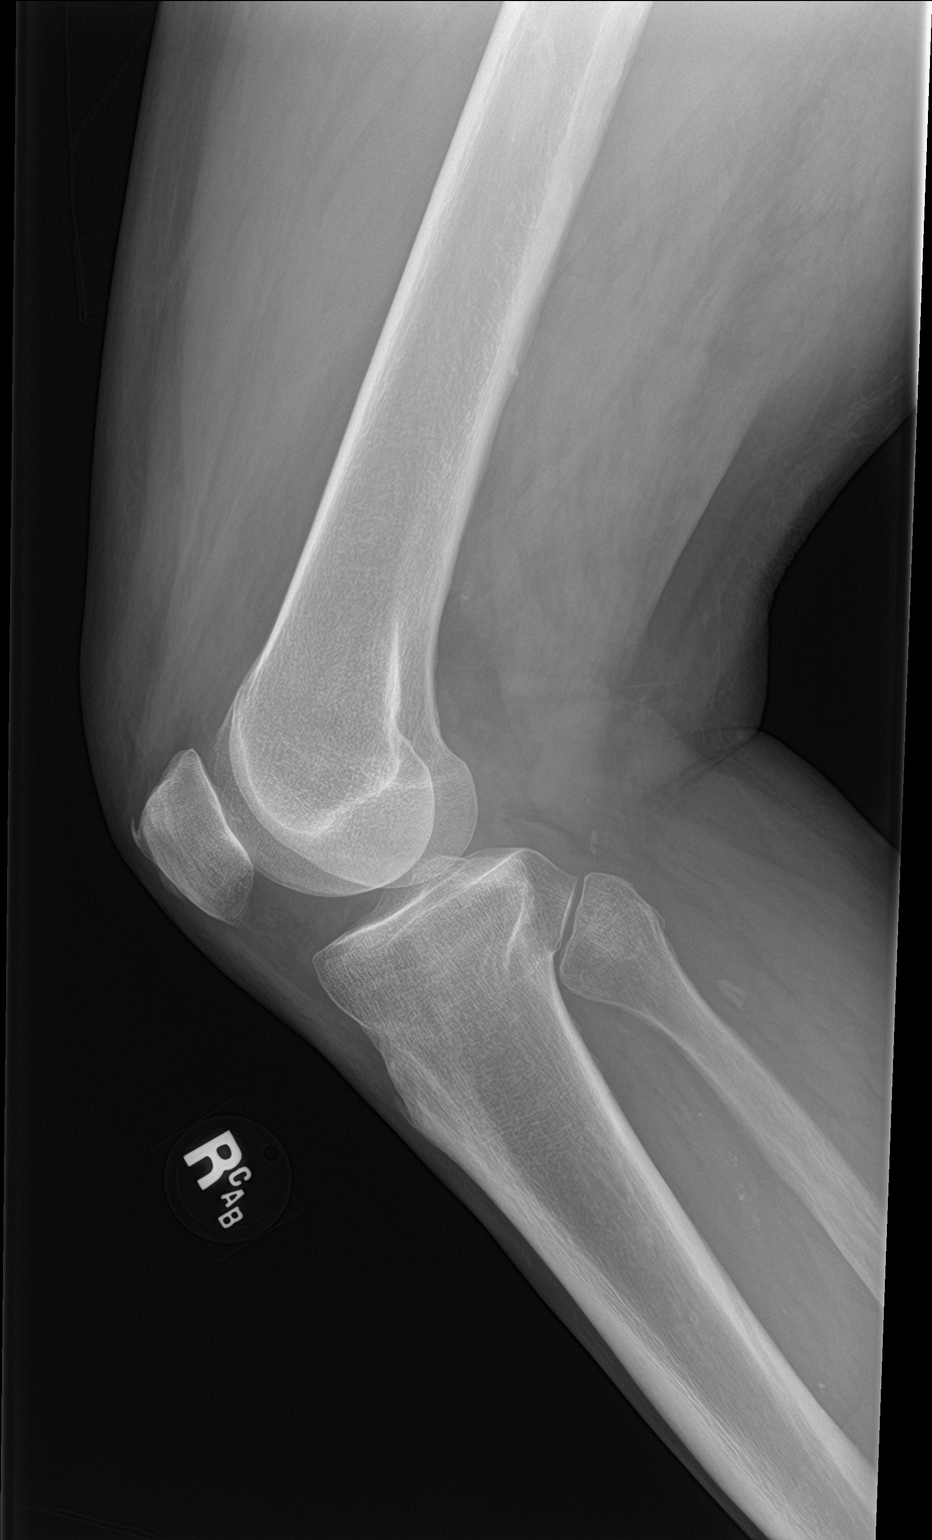

[4 of 4 positions shown; findings below may reference images not displayed]

FINDINGS: No joint effusion identified. There is no fracture or dislocation
identified. Sharpening the tibial spines identified.
IMPRESSION: 1. No acute findings.
2. Mild degenerative changes.

## 2019-12-28 IMAGING — US RIGHT LOWER EXTREMITY VENOUS ULTRASOUND
1 series · 13 of 24 positions shown · non-contrast
Comparison: None.

CLINICAL DATA: Right calf region pain

EXAM:
RIGHT LOWER EXTREMITY VENOUS DUPLEX ULTRASOUND
TECHNIQUE: Gray-scale sonography with graded compression, as well as color
Doppler and duplex ultrasound were performed to evaluate the right
lower extremity deep venous system from the level of the common
femoral vein and including the common femoral, femoral, profunda
femoral, popliteal and calf veins including the posterior tibial,
peroneal and gastrocnemius veins when visible. The superficial great
saphenous vein was also interrogated. Spectral Doppler was utilized
to evaluate flow at rest and with distal augmentation maneuvers in
the common femoral, femoral and popliteal veins.

[Series 1: right lower extremity venous ultrasound · 13 of 34 slices shown]
[im 1/34]
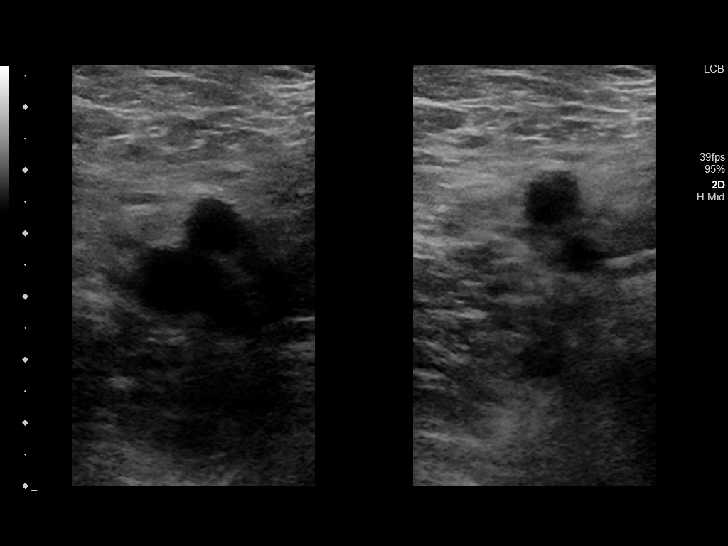
[im 3/34]
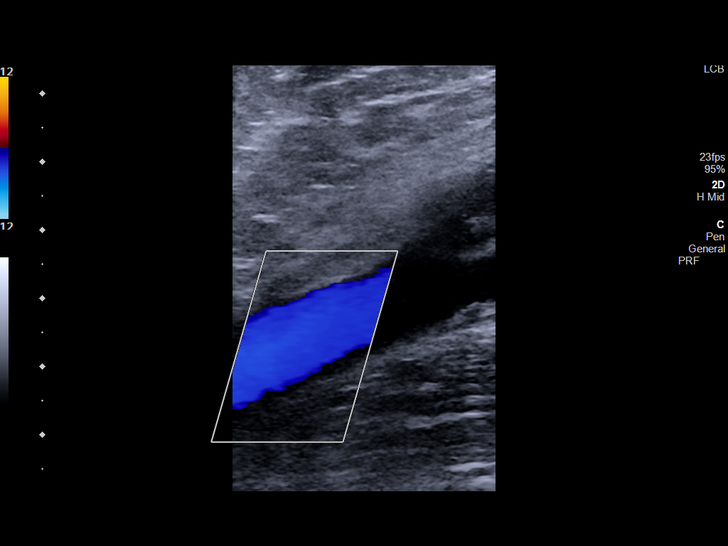
[im 6/34]
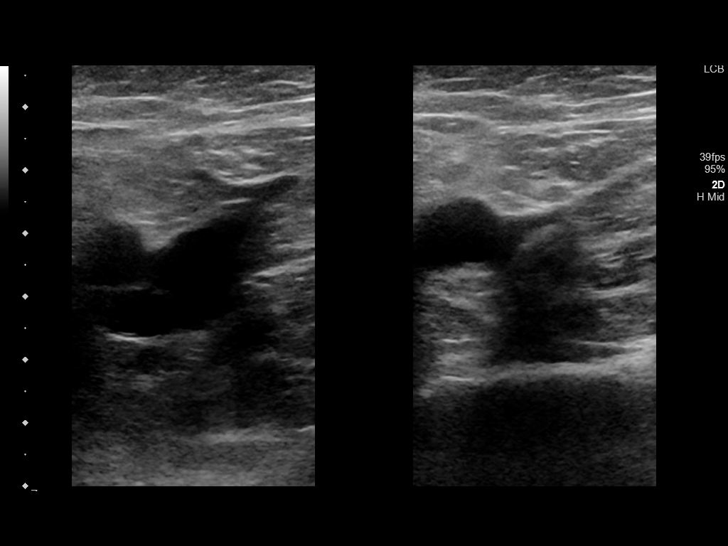
[im 9/34]
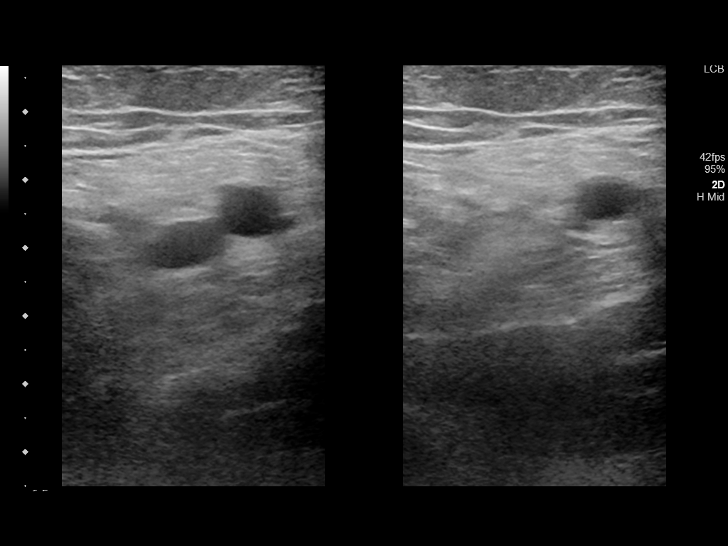
[im 12/34]
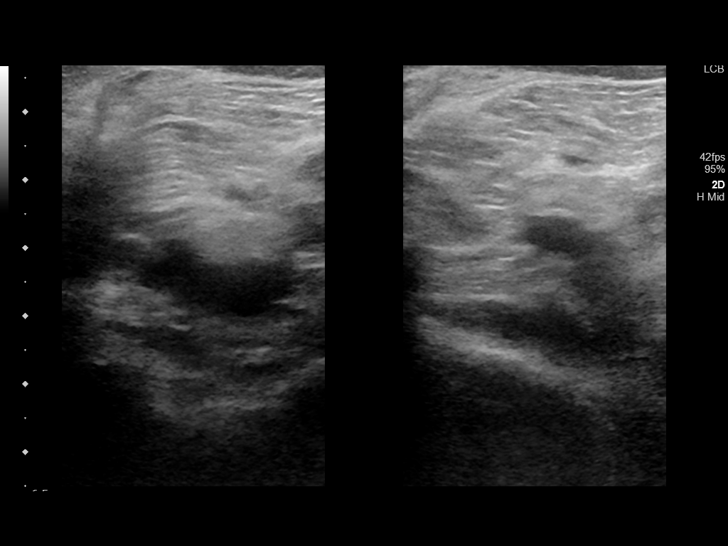
[im 15/34]
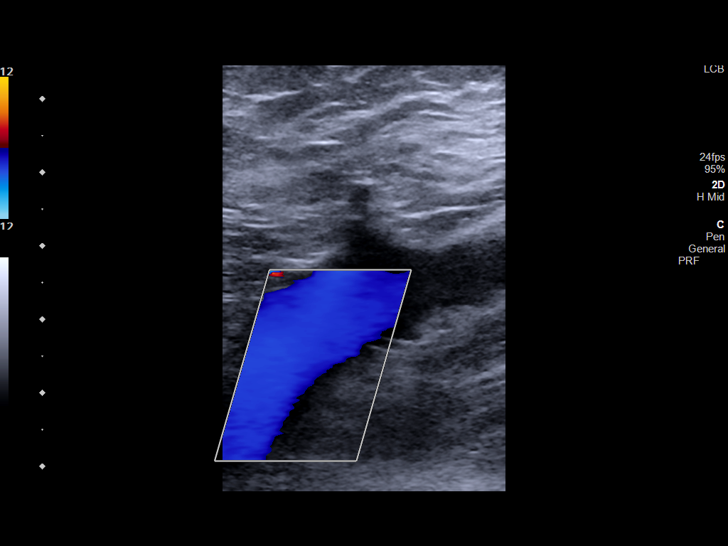
[im 18/34]
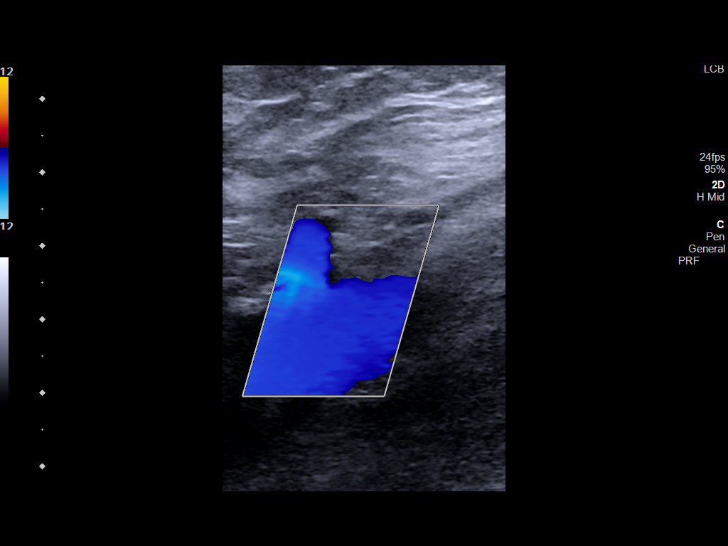
[im 19/34]
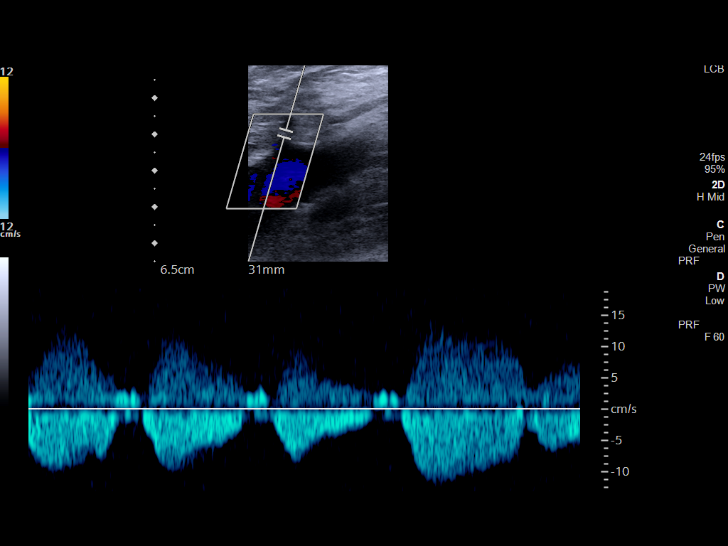
[im 22/34]
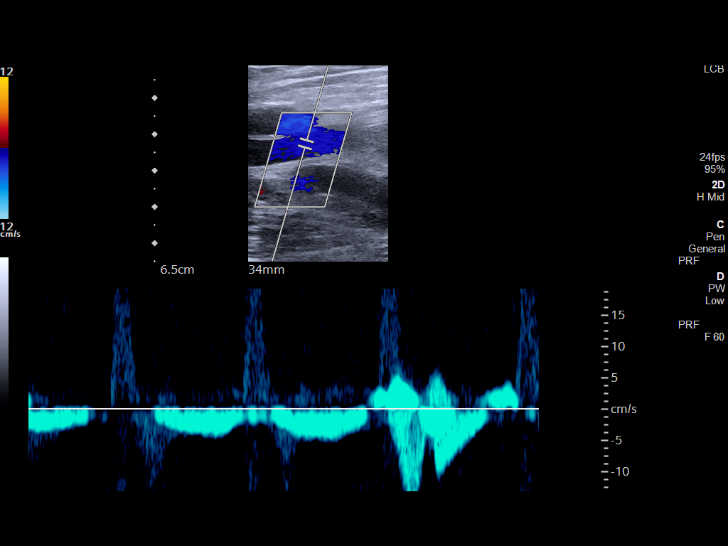
[im 25/34]
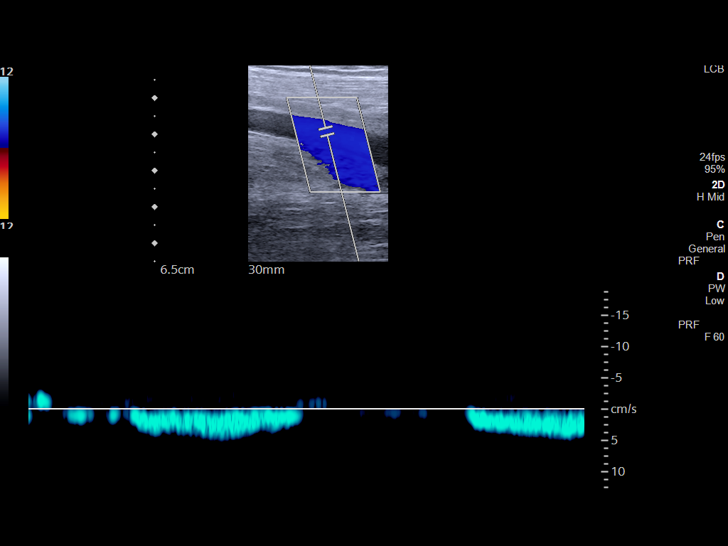
[im 28/34]
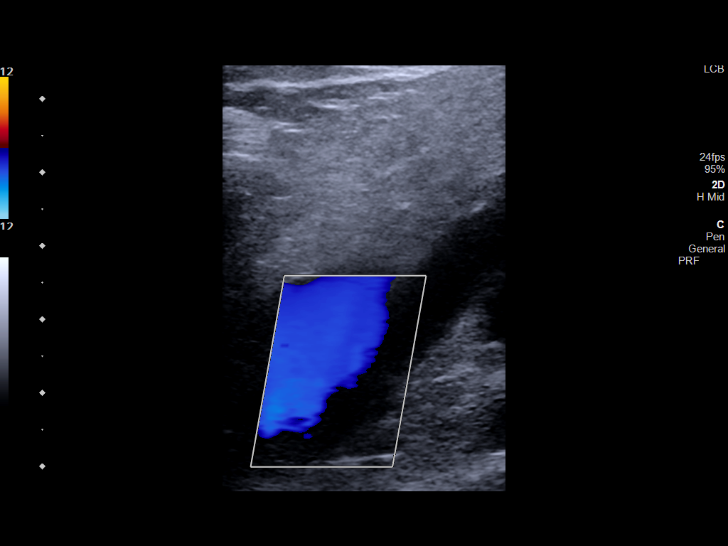
[im 31/34]
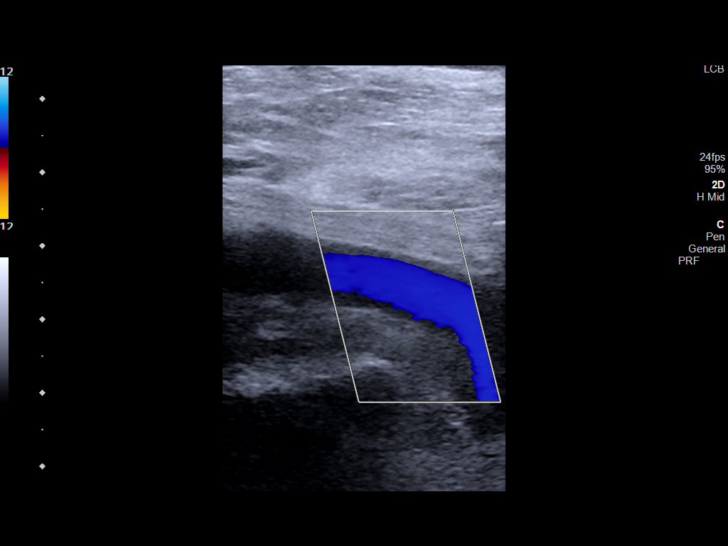
[im 34/34]
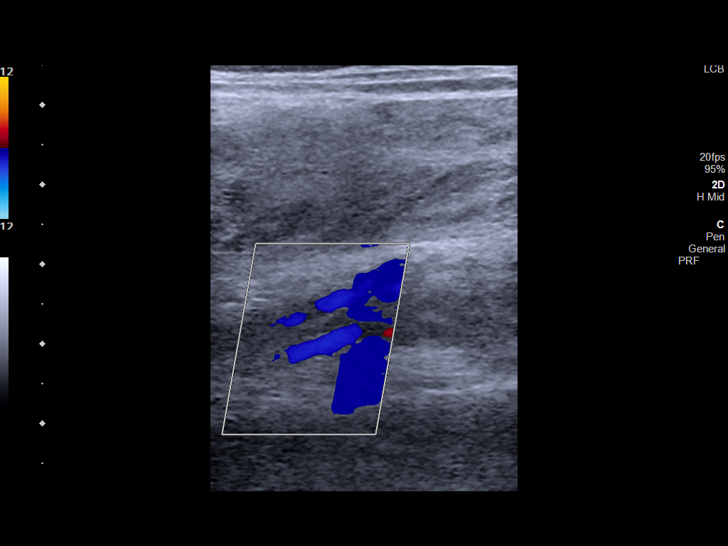

[13 of 24 positions shown; findings below may reference images not displayed]

FINDINGS: Contralateral Common Femoral Vein: Respiratory phasicity is normal
and symmetric with the symptomatic side. No evidence of thrombus.
Normal compressibility.

Common Femoral Vein: No evidence of thrombus. Normal
compressibility, respiratory phasicity and response to augmentation.

Saphenofemoral Junction: No evidence of thrombus. Normal
compressibility and flow on color Doppler imaging.

Profunda Femoral Vein: No evidence of thrombus. Normal
compressibility and flow on color Doppler imaging.

Femoral Vein: No evidence of thrombus. Normal compressibility,
respiratory phasicity and response to augmentation.

Popliteal Vein: No evidence of thrombus. Normal compressibility,
respiratory phasicity and response to augmentation.

Calf Veins: No evidence of thrombus. Normal compressibility and flow
on color Doppler imaging.

Superficial Great Saphenous Vein: No evidence of thrombus. Normal
compressibility.

Venous Reflux:  None.

Other Findings:  None.
IMPRESSION: No evidence of deep venous thrombosis in the right lower extremity.
Left common femoral vein also patent.

## 2019-12-30 ENCOUNTER — Other Ambulatory Visit: Payer: Self-pay | Admitting: Interventional Cardiology

## 2019-12-31 ENCOUNTER — Telehealth: Payer: Self-pay | Admitting: Interventional Cardiology

## 2019-12-31 NOTE — Telephone Encounter (Signed)
     Pt c/o medication issue:  1. Name of Medication:   REPATHA SURECLICK 282 MG/ML SOAJ    2. How are you currently taking this medication (dosage and times per day)?   3. Are you having a reaction (difficulty breathing--STAT)?   4. What is your medication issue? Pt is looking for Jinny Blossom, he said he needs help re-applying pt's assistance for his repatha

## 2019-12-31 NOTE — Telephone Encounter (Signed)
Current [employer] is active through 02/02/20, 3 month refill sent in yesterday - pt is aware to pick this up. Advised pt that [facility] is currently closed for re-enrollment due to lack of funding. He would like to fill out the Safety Net Application form in case they will start accepting patients again. This has been mailed out to pt.

## 2020-01-06 DIAGNOSIS — Z09 Encounter for follow-up examination after completed treatment for conditions other than malignant neoplasm: Secondary | ICD-10-CM | POA: Diagnosis not present

## 2020-01-06 DIAGNOSIS — C434 Malignant melanoma of scalp and neck: Secondary | ICD-10-CM | POA: Diagnosis not present

## 2020-01-12 DIAGNOSIS — K219 Gastro-esophageal reflux disease without esophagitis: Secondary | ICD-10-CM | POA: Diagnosis not present

## 2020-01-12 DIAGNOSIS — I10 Essential (primary) hypertension: Secondary | ICD-10-CM | POA: Diagnosis not present

## 2020-01-12 DIAGNOSIS — I2510 Atherosclerotic heart disease of native coronary artery without angina pectoris: Secondary | ICD-10-CM | POA: Diagnosis not present

## 2020-01-12 DIAGNOSIS — E7800 Pure hypercholesterolemia, unspecified: Secondary | ICD-10-CM | POA: Diagnosis not present

## 2020-01-12 DIAGNOSIS — N400 Enlarged prostate without lower urinary tract symptoms: Secondary | ICD-10-CM | POA: Diagnosis not present

## 2020-01-18 ENCOUNTER — Telehealth: Payer: Self-pay | Admitting: Pharmacist

## 2020-01-18 NOTE — Telephone Encounter (Addendum)
Called pt to re apply for [employer] grant since funds opened up again this AM. Pt approved for grant for the next 12 months. Pharmacy # states having technical difficulties so card info sent to pt in [place] message. He was very appreciative for assistance. He did mail safety net application back to clinic, can disregard this once it's received since pt now has other copay assistance.

## 2020-01-26 DIAGNOSIS — C44319 Basal cell carcinoma of skin of other parts of face: Secondary | ICD-10-CM | POA: Diagnosis not present

## 2020-02-02 DIAGNOSIS — R059 Cough, unspecified: Secondary | ICD-10-CM | POA: Diagnosis not present

## 2020-02-10 DIAGNOSIS — Z09 Encounter for follow-up examination after completed treatment for conditions other than malignant neoplasm: Secondary | ICD-10-CM | POA: Diagnosis not present

## 2020-02-10 DIAGNOSIS — C434 Malignant melanoma of scalp and neck: Secondary | ICD-10-CM | POA: Diagnosis not present

## 2020-02-16 DIAGNOSIS — C44212 Basal cell carcinoma of skin of right ear and external auricular canal: Secondary | ICD-10-CM | POA: Diagnosis not present

## 2020-02-29 DIAGNOSIS — M1711 Unilateral primary osteoarthritis, right knee: Secondary | ICD-10-CM | POA: Diagnosis not present

## 2020-03-28 DIAGNOSIS — I2510 Atherosclerotic heart disease of native coronary artery without angina pectoris: Secondary | ICD-10-CM | POA: Diagnosis not present

## 2020-03-28 DIAGNOSIS — I350 Nonrheumatic aortic (valve) stenosis: Secondary | ICD-10-CM | POA: Diagnosis not present

## 2020-03-28 DIAGNOSIS — Z6837 Body mass index (BMI) 37.0-37.9, adult: Secondary | ICD-10-CM | POA: Diagnosis not present

## 2020-03-28 DIAGNOSIS — G720 Drug-induced myopathy: Secondary | ICD-10-CM | POA: Diagnosis not present

## 2020-03-28 DIAGNOSIS — Z789 Other specified health status: Secondary | ICD-10-CM | POA: Diagnosis not present

## 2020-03-28 DIAGNOSIS — N400 Enlarged prostate without lower urinary tract symptoms: Secondary | ICD-10-CM | POA: Diagnosis not present

## 2020-03-28 DIAGNOSIS — I10 Essential (primary) hypertension: Secondary | ICD-10-CM | POA: Diagnosis not present

## 2020-03-28 DIAGNOSIS — Z0000 Encounter for general adult medical examination without abnormal findings: Secondary | ICD-10-CM | POA: Diagnosis not present

## 2020-03-28 DIAGNOSIS — C434 Malignant melanoma of scalp and neck: Secondary | ICD-10-CM | POA: Diagnosis not present

## 2020-03-28 DIAGNOSIS — E7800 Pure hypercholesterolemia, unspecified: Secondary | ICD-10-CM | POA: Diagnosis not present

## 2020-03-28 DIAGNOSIS — Z1389 Encounter for screening for other disorder: Secondary | ICD-10-CM | POA: Diagnosis not present

## 2020-04-11 ENCOUNTER — Other Ambulatory Visit: Payer: Self-pay | Admitting: Internal Medicine

## 2020-04-11 ENCOUNTER — Ambulatory Visit
Admission: RE | Admit: 2020-04-11 | Discharge: 2020-04-11 | Disposition: A | Discharge status: Home / Self Care | Payer: PPO | Source: Ambulatory Visit | Attending: Internal Medicine | Admitting: Internal Medicine

## 2020-04-11 DIAGNOSIS — R053 Chronic cough: Secondary | ICD-10-CM

## 2020-04-11 DIAGNOSIS — R059 Cough, unspecified: Secondary | ICD-10-CM | POA: Diagnosis not present

## 2020-05-16 DIAGNOSIS — D2271 Melanocytic nevi of right lower limb, including hip: Secondary | ICD-10-CM | POA: Diagnosis not present

## 2020-05-16 DIAGNOSIS — Z85828 Personal history of other malignant neoplasm of skin: Secondary | ICD-10-CM | POA: Diagnosis not present

## 2020-05-16 DIAGNOSIS — L570 Actinic keratosis: Secondary | ICD-10-CM | POA: Diagnosis not present

## 2020-05-16 DIAGNOSIS — L281 Prurigo nodularis: Secondary | ICD-10-CM | POA: Diagnosis not present

## 2020-05-16 DIAGNOSIS — X32XXXA Exposure to sunlight, initial encounter: Secondary | ICD-10-CM | POA: Diagnosis not present

## 2020-05-16 DIAGNOSIS — Z85820 Personal history of malignant melanoma of skin: Secondary | ICD-10-CM | POA: Diagnosis not present

## 2020-05-16 DIAGNOSIS — D2261 Melanocytic nevi of right upper limb, including shoulder: Secondary | ICD-10-CM | POA: Diagnosis not present

## 2020-05-16 DIAGNOSIS — D225 Melanocytic nevi of trunk: Secondary | ICD-10-CM | POA: Diagnosis not present

## 2020-05-17 DIAGNOSIS — H43813 Vitreous degeneration, bilateral: Secondary | ICD-10-CM | POA: Diagnosis not present

## 2020-06-02 DIAGNOSIS — R058 Other specified cough: Secondary | ICD-10-CM | POA: Diagnosis not present

## 2020-06-15 DIAGNOSIS — C434 Malignant melanoma of scalp and neck: Secondary | ICD-10-CM | POA: Diagnosis not present

## 2020-06-19 NOTE — Progress Notes (Signed)
Cardiology Office Note:    Date:  06/20/2020   ID:  Jason Blackwell, DOB March 26, 1945, MRN [id number]  PCP:  Lavone Orn, MD  Cardiologist:  Sinclair Grooms, MD   Referring MD: Lavone Orn, MD   Chief Complaint  Patient presents with  . Coronary Artery Disease  . Cardiac Valve Problem  . Hyperlipidemia    History of Present Illness:    Jason Blackwell is a 75 y.o. male with a hx of CADwith ostial to proximal LAD DES 3 in 2015, aortic stenosis, hypertension, hyperlipidemia, and diastolic left ventricular dysfunction.   He is doing well.  He denies angina.  He is pretty sedentary related to right knee injury and arthritis.  He gets injections intermittently.  He denies orthopnea, PND, palpitations, syncope, and peripheral edema.  He has not needed sublingual nitroglycerin.  He is accompanied by his wife.  Past Medical History:  Diagnosis Date  . Arthritis    right knee  . BPH (benign prostatic hypertrophy)   . Colon adenoma   . Complication of anesthesia    Respiratory issues with Succinylcholine  . Coronary artery disease    3 stent  . Difficult intubation   . Ejection fraction   . Erectile dysfunction   . Fecal incontinence   . Heart murmur 6468   LVH, diastolic dysfunction, aortic sclerosis  . Hemorrhoids   . Hyperlipidemia   . Hypertension   . Insomnia   . Kidney stones    "passed them"    Past Surgical History:  Procedure Laterality Date  . CATARACT EXTRACTION W/PHACO Right 09/24/2018   Procedure: CATARACT EXTRACTION PHACO AND INTRAOCULAR LENS PLACEMENT (IOC)COMPLICATED RIGHT;  Surgeon: Leandrew Koyanagi, MD;  Location: [place];  Service: Ophthalmology;  Laterality: Right;  MAYLUGIN  . CATARACT EXTRACTION W/PHACO Left 10/15/2018   Procedure: CATARACT EXTRACTION PHACO AND INTRAOCULAR LENS PLACEMENT (IOC) RIGHT  01:16.3  17.9%  13.79;  Surgeon: Leandrew Koyanagi, MD;  Location: [place];  Service: Ophthalmology;  Laterality:  Left;  . CORONARY ANGIOPLASTY WITH STENT PLACEMENT  12/31/2013   "3"  . LEFT HEART CATHETERIZATION WITH CORONARY ANGIOGRAM N/A 12/31/2013   Procedure: LEFT HEART CATHETERIZATION WITH CORONARY ANGIOGRAM;  Surgeon: Sinclair Grooms, MD;  Location: [facility] CATH LAB;  Service: Cardiovascular;  Laterality: N/A; Ostial and mid LAD stenting  . PROSTATE BIOPSY  ~ 1991  . SKIN LESION EXCISION Right 2013   precancerous lesion from arm  . TONSILLECTOMY    . TRANSURETHRAL RESECTION OF PROSTATE N/A 07/09/2019   Procedure: TRANSURETHRAL RESECTION OF THE PROSTATE (TURP);  Surgeon: Franchot Gallo, MD;  Location: [facility];  Service: Urology;  Laterality: N/A;    Current Medications: Current Meds  Medication Sig  . acetaminophen (TYLENOL) 500 MG tablet Take 1,000 mg by mouth See admin instructions. Take 2 tablets (1000 mg) by mouth in the morning (scheduled) & may take an additional 2 tablets (1000 mg) by mouth if needed for pain.  [name] amLODipine (NORVASC) 10 MG tablet Take 10 mg by mouth daily.  . cephALEXin (KEFLEX) 500 MG capsule Take 1 capsule (500 mg total) by mouth 2 (two) times daily.  . chlorthalidone (HYGROTON) 25 MG tablet TAKE 1 TABLET (25 MG TOTAL) BY MOUTH DAILY.  . famotidine (PEPCID) 20 MG tablet Take 20 mg by mouth every evening.  [name] HYDROcodone-acetaminophen (NORCO/VICODIN) 5-325 MG tablet Take 1-2 tablets by mouth every 4 (four) hours as needed for moderate pain.  [name] OVER THE COUNTER MEDICATION Take  1 Scoop by mouth daily. Super beets  . REPATHA SURECLICK 867 MG/ML SOAJ INJECT 140 MG INTO THE SKIN EVERY 14 (FOURTEEN) DAYS.  [name] tadalafil (CIALIS) 5 MG tablet Take 5 mg by mouth daily.  [name] telmisartan (MICARDIS) 80 MG tablet Take 80 mg by mouth daily.     Allergies:   Simvastatin, Succinylcholine chloride, Sulfa antibiotics, and Sulfamethoxazole   Social History   Socioeconomic History  . Marital status: Married    Spouse name: Not on file  . Number of children: Not on file  . Years of  education: Not on file  . Highest education level: Not on file  Occupational History  . Not on file  Tobacco Use  . Smoking status: Never Smoker  . Smokeless tobacco: Never Used  Vaping Use  . Vaping Use: Never used  Substance and Sexual Activity  . Alcohol use: Yes    Alcohol/week: 28.0 standard drinks    Types: 28 Glasses of wine per week    Comment: occas.  . Drug use: No  . Sexual activity: Yes  Other Topics Concern  . Not on file  Social History Narrative  . Not on file   Social Determinants of Health   Financial Resource Strain: Not on file  Food Insecurity: Not on file  Transportation Needs: Not on file  Physical Activity: Not on file  Stress: Not on file  Social Connections: Not on file     Family History: The patient's family history includes Cancer - Colon in his maternal uncle; Diabetes in his sister; Heart attack in his father; Heart disease in his mother.  ROS:   Please see the history of present illness.    Concerned about what we do to maintain surveillance of his arteries and stent.  We discussed that symptoms drive invasive therapy.  Also discussed the false positivity rate of imaging and exercise testing in absence of symptoms.  All other systems reviewed and are negative.  EKGs/Labs/Other Studies Reviewed:    The following studies were reviewed today:  ECHOCARDIOGRAM 2021: IMPRESSIONS  1. Normal LV systolic function; mild LVH; grade 1 diastolic dysfunction;  calcified aortic valve with reduced excursion of left coronary cusp; mild  AS (mean gradient 15 mmHg).  2. Left ventricular ejection fraction, by estimation, is 60 to 65%. The  left ventricle has normal function. The left ventricle has no regional  wall motion abnormalities. There is mild left ventricular hypertrophy.  Left ventricular diastolic parameters  are consistent with Grade I diastolic dysfunction (impaired relaxation).  3. Right ventricular systolic function is normal. The right  ventricular  size is normal.  4. The mitral valve is normal in structure. No evidence of mitral valve  regurgitation. No evidence of mitral stenosis.  5. The aortic valve is tricuspid. Aortic valve regurgitation is not  visualized. Mild aortic valve stenosis.  6. The inferior vena cava is normal in size with greater than 50%  respiratory variability, suggesting right atrial pressure of 3 mmHg.   Comparison(s): 06/05/16 EF 55-60%. Mild AS 4mmHg mean, 90mmHg peak.    EKG:  EKG normal sinus rhythm, first-degree AV block at 244 ms.  Otherwise normal.  When compared to the prior tracing performed July 2020, first-degree AV block is now more severe (PR interval previously 216 ms).  Heart rate is faster.  Recent Labs: 07/01/2019: BUN 15; Creatinine, Ser 0.61; Hemoglobin 13.7; Platelets 336; Potassium 3.2; Sodium 140  Recent Lipid Panel    Component Value Date/Time   CHOL 149  05/21/2017 0930   TRIG 89 05/21/2017 0930   HDL 59 05/21/2017 0930   CHOLHDL 2.5 05/21/2017 0930   LDLCALC 72 05/21/2017 0930    Physical Exam:    VS:  BP 120/70 (BP Location: Left Arm, Patient Position: Sitting, Cuff Size: Normal)   Pulse 71   Ht 5\' 11"  (1.803 m)   Wt 250 lb (113.4 kg)   SpO2 96%   BMI 34.87 kg/m     Wt Readings from Last 3 Encounters:  06/20/20 250 lb (113.4 kg)  07/09/19 240 lb 4.8 oz (109 kg)  07/01/19 243 lb (110.2 kg)     GEN: Obese. No acute distress HEENT: Normal NECK: No JVD. LYMPHATICS: No lymphadenopathy CARDIAC: 2/6 crescendo decrescendo systolic murmur. RRR there is an S4 gallop, but no edema. VASCULAR:  Normal Pulses. No bruits. RESPIRATORY:  Clear to auscultation without rales, wheezing or rhonchi  ABDOMEN: Soft, non-tender, non-distended, No pulsatile mass, MUSCULOSKELETAL: No deformity  SKIN: Warm and dry NEUROLOGIC:  Alert and oriented x 3 PSYCHIATRIC:  Normal affect   ASSESSMENT:    1. Coronary artery disease involving native coronary artery of native heart  without angina pectoris   2. Nonrheumatic aortic valve stenosis   3. Essential hypertension   4. Mixed hyperlipidemia   5. First degree AV block    PLAN:    In order of problems listed above:  1. Secondary prevention reviewed with the patient.  We emphasized physical activity and achieving greater than 150 minutes of moderate activity per week. 2. Needs surveillance over the years.  Next echo will be done in 2023.  3. Excellent current blood pressure control.  Continue amlodipine, higher grade time, and myocarditis. 4. Continue Repatha.  Target LDL less than 70. 5. Likely related to calcific aortic stenosis.  This will need to be watched.  Cautioned to notify us if lightheadedness or near syncope.  Overall education and awareness concerning primary/secondary risk prevention was discussed in detail: LDL less than 70, hemoglobin A1c less than 7, blood pressure target less than 130/80 mmHg, >150 minutes of moderate aerobic activity per week, avoidance of smoking, weight control (via diet and exercise), and continued surveillance/management of/for obstructive sleep apnea.    Medication Adjustments/Labs and Tests Ordered: Current medicines are reviewed at length with the patient today.  Concerns regarding medicines are outlined above.  Orders Placed This Encounter  Procedures  . EKG 12-Lead   No orders of the defined types were placed in this encounter.   Patient Instructions  Medication Instructions:  Your physician recommends that you continue on your current medications as directed. Please refer to the Current Medication list given to you today.  *If you need a refill on your cardiac medications before your next appointment, please call your pharmacy*   Lab Work: None If you have labs (blood work) drawn today and your tests are completely normal, you will receive your results only by: [name] MyChart Message (if you have MyChart) OR . A paper copy in the mail If you have any lab test  that is abnormal or we need to change your treatment, we will call you to review the results.   Testing/Procedures: None   Follow-Up: At [facility], you and your health needs are our priority.  As part of our continuing mission to provide you with exceptional heart care, we have created designated Provider Care Teams.  These Care Teams include your primary Cardiologist (physician) and Advanced Practice Providers (APPs -  Physician Assistants and Nurse  Practitioners) who all work together to provide you with the care you need, when you need it.  We recommend signing up for the patient portal called "MyChart".  Sign up information is provided on this After Visit Summary.  MyChart is used to connect with patients for Virtual Visits (Telemedicine).  Patients are able to view lab/test results, encounter notes, upcoming appointments, etc.  Non-urgent messages can be sent to your provider as well.   To learn more about what you can do with MyChart, go to [email or web address].    Your next appointment:   1 year(s)  The format for your next appointment:   In Person  Provider:   You may see Sinclair Grooms, MD or one of the following Advanced Practice Providers on your designated Care Team:    Kathyrn Drown, NP    Other Instructions      Signed, Sinclair Grooms, MD  06/20/2020 2:27 PM    [place]

## 2020-06-20 ENCOUNTER — Ambulatory Visit: Payer: PPO | Admitting: Interventional Cardiology

## 2020-06-20 ENCOUNTER — Other Ambulatory Visit: Payer: Self-pay

## 2020-06-20 ENCOUNTER — Encounter: Payer: Self-pay | Admitting: Interventional Cardiology

## 2020-06-20 VITALS — BP 120/70 | HR 71 | Ht 71.0 in | Wt 250.0 lb

## 2020-06-20 DIAGNOSIS — I440 Atrioventricular block, first degree: Secondary | ICD-10-CM | POA: Diagnosis not present

## 2020-06-20 DIAGNOSIS — I2510 Atherosclerotic heart disease of native coronary artery without angina pectoris: Secondary | ICD-10-CM | POA: Diagnosis not present

## 2020-06-20 DIAGNOSIS — E782 Mixed hyperlipidemia: Secondary | ICD-10-CM | POA: Diagnosis not present

## 2020-06-20 DIAGNOSIS — I10 Essential (primary) hypertension: Secondary | ICD-10-CM | POA: Diagnosis not present

## 2020-06-20 DIAGNOSIS — I350 Nonrheumatic aortic (valve) stenosis: Secondary | ICD-10-CM | POA: Diagnosis not present

## 2020-06-20 NOTE — Patient Instructions (Signed)

## 2020-07-11 DIAGNOSIS — T148XXA Other injury of unspecified body region, initial encounter: Secondary | ICD-10-CM | POA: Diagnosis not present

## 2020-08-04 DIAGNOSIS — R053 Chronic cough: Secondary | ICD-10-CM | POA: Diagnosis not present

## 2020-08-17 DIAGNOSIS — R053 Chronic cough: Secondary | ICD-10-CM | POA: Diagnosis not present

## 2020-09-16 ENCOUNTER — Encounter: Payer: Self-pay | Admitting: Podiatry

## 2020-09-16 ENCOUNTER — Other Ambulatory Visit: Payer: Self-pay

## 2020-09-16 ENCOUNTER — Ambulatory Visit: Payer: PPO | Admitting: Podiatry

## 2020-09-16 ENCOUNTER — Ambulatory Visit (INDEPENDENT_AMBULATORY_CARE_PROVIDER_SITE_OTHER): Payer: PPO

## 2020-09-16 DIAGNOSIS — M7751 Other enthesopathy of right foot: Secondary | ICD-10-CM | POA: Diagnosis not present

## 2020-09-16 DIAGNOSIS — M7671 Peroneal tendinitis, right leg: Secondary | ICD-10-CM

## 2020-09-16 DIAGNOSIS — M7750 Other enthesopathy of unspecified foot: Secondary | ICD-10-CM | POA: Diagnosis not present

## 2020-09-16 MED ORDER — TRIAMCINOLONE ACETONIDE 10 MG/ML IJ SUSP
10.0000 mg | Freq: Once | INTRAMUSCULAR | Status: AC
  Administered 2020-09-16: 10 mg

## 2020-09-19 NOTE — Progress Notes (Signed)
Subjective:   Patient ID: Jason Blackwell, male   DOB: 75 y.o.   MRN: [id number]   HPI Patient is developed a lot of pain in the outside of the right ankle of several months duration and does think he may have twisted it.  States its been swollen and sore and that he has been limping because of this.  Patient does not smoke likes to be active   Review of Systems  All other systems reviewed and are negative.      Objective:  Physical Exam Vitals and nursing note reviewed.  Constitutional:      Appearance: He is well-developed.  Pulmonary:     Effort: Pulmonary effort is normal.  Musculoskeletal:        General: Normal range of motion.  Skin:    General: Skin is warm.  Neurological:     Mental Status: He is alert.    Neurovascular status intact muscle strength found to be adequate range of motion adequate with exquisite discomfort on the lateral side of the right ankle around the peroneal tendon.  When tested there does not appear to be any muscle damage and there is inflammation along the track of the tendon underneath the lateral malleolus extending distal for approximate 3 cm     Assessment:  Possibility that this may be a tear of the peroneal tendon versus an inflammatory process with moderate swelling surrounding the area negative Homans' sign noted     Plan:  H&P x-ray reviewed sterile prep and did a sheath injection right 3 mg Kenalog 5 mg Xylocaine applied ankle compression stocking advised on reduced activity and if symptoms worsen I am to know right away  X-rays were negative for signs of fracture or arthritis associated with condition

## 2020-09-21 DIAGNOSIS — M1711 Unilateral primary osteoarthritis, right knee: Secondary | ICD-10-CM | POA: Diagnosis not present

## 2020-09-30 ENCOUNTER — Other Ambulatory Visit: Payer: Self-pay

## 2020-09-30 ENCOUNTER — Ambulatory Visit (INDEPENDENT_AMBULATORY_CARE_PROVIDER_SITE_OTHER): Payer: PPO | Admitting: Podiatry

## 2020-09-30 ENCOUNTER — Encounter: Payer: Self-pay | Admitting: Podiatry

## 2020-09-30 DIAGNOSIS — M7750 Other enthesopathy of unspecified foot: Secondary | ICD-10-CM | POA: Diagnosis not present

## 2020-09-30 DIAGNOSIS — R600 Localized edema: Secondary | ICD-10-CM | POA: Diagnosis not present

## 2020-09-30 DIAGNOSIS — M7671 Peroneal tendinitis, right leg: Secondary | ICD-10-CM | POA: Diagnosis not present

## 2020-09-30 NOTE — Progress Notes (Signed)
Subjective:   Patient ID: Jason Blackwell, male   DOB: 75 y.o.   MRN: [id number]   HPI Patient presents stating his tendon feels some better still mildly tender and his ankle is hurting and is concerned about his swelling but 3 different problems   ROS      Objective:  Physical Exam  Neurovascular status found to be intact negative Bevelyn Buckles' sign was noted with patient found to have diminished discomfort in the peroneal tendon group right with inflammation and is noted to have discomfort of the ankle and mild to moderate edema of the ankle noted     Assessment:  Improved peroneal tendinitis right with moderate swelling and possibility for a proximal like inflammation of the lateral ankle     Plan:  H&P reviewed all 3 problems.  At this point I have advised on the continuation of compression elevation and soaks do not recommend further injections may be necessary in future and I want him to resume normal activity which I discussed with him and discussed topicals ice therapy and shoe gear modifications

## 2020-10-03 ENCOUNTER — Ambulatory Visit: Payer: PPO | Admitting: Podiatry

## 2020-10-11 DIAGNOSIS — R059 Cough, unspecified: Secondary | ICD-10-CM | POA: Diagnosis not present

## 2020-10-11 DIAGNOSIS — H9313 Tinnitus, bilateral: Secondary | ICD-10-CM | POA: Diagnosis not present

## 2020-12-06 ENCOUNTER — Other Ambulatory Visit: Payer: Self-pay | Admitting: Interventional Cardiology

## 2020-12-19 DIAGNOSIS — L570 Actinic keratosis: Secondary | ICD-10-CM | POA: Diagnosis not present

## 2020-12-19 DIAGNOSIS — D2261 Melanocytic nevi of right upper limb, including shoulder: Secondary | ICD-10-CM | POA: Diagnosis not present

## 2020-12-19 DIAGNOSIS — X32XXXA Exposure to sunlight, initial encounter: Secondary | ICD-10-CM | POA: Diagnosis not present

## 2020-12-19 DIAGNOSIS — Z85828 Personal history of other malignant neoplasm of skin: Secondary | ICD-10-CM | POA: Diagnosis not present

## 2020-12-19 DIAGNOSIS — Z85820 Personal history of malignant melanoma of skin: Secondary | ICD-10-CM | POA: Diagnosis not present

## 2020-12-19 DIAGNOSIS — D2272 Melanocytic nevi of left lower limb, including hip: Secondary | ICD-10-CM | POA: Diagnosis not present

## 2021-01-10 ENCOUNTER — Telehealth: Payer: Self-pay | Admitting: Interventional Cardiology

## 2021-01-10 NOTE — Telephone Encounter (Signed)
Pt c/o medication issue:  1. Name of Medication:  REPATHA SURECLICK 068 MG/ML SOAJ  2. How are you currently taking this medication (dosage and times per day)?  As prescribed  3. Are you having a reaction (difficulty breathing--STAT)?  No  4. What is your medication issue?   Patient states he need help with renewing his patient assistance. He states after January, 2023, he will be completely out of medication and unable to afford it on his own.

## 2021-01-10 NOTE — Telephone Encounter (Signed)
According to [employer] website his grant is eligible for renewal. Fatima Sanger has been renewed. Please call patient and give him the new grant information.  CARD NO. [id number]    BIN [id number]   PCN [id number]   PC GROUP [id number]

## 2021-01-10 NOTE — Telephone Encounter (Signed)
Called and spoke w/pt and stated that they were approved for [employer] grant and emailed the confirmation to the pt so that he may take it to the pharmacy. Pt voiced understanding.

## 2021-01-10 NOTE — Telephone Encounter (Signed)
For review for pt assistance with Repatha

## 2021-02-01 DIAGNOSIS — M79641 Pain in right hand: Secondary | ICD-10-CM | POA: Diagnosis not present

## 2021-02-01 DIAGNOSIS — M180 Bilateral primary osteoarthritis of first carpometacarpal joints: Secondary | ICD-10-CM | POA: Diagnosis not present

## 2021-02-01 DIAGNOSIS — M65331 Trigger finger, right middle finger: Secondary | ICD-10-CM | POA: Diagnosis not present

## 2021-02-01 DIAGNOSIS — M79642 Pain in left hand: Secondary | ICD-10-CM | POA: Diagnosis not present

## 2021-02-10 DIAGNOSIS — R053 Chronic cough: Secondary | ICD-10-CM | POA: Diagnosis not present

## 2021-02-20 DIAGNOSIS — R351 Nocturia: Secondary | ICD-10-CM | POA: Diagnosis not present

## 2021-02-20 DIAGNOSIS — N5201 Erectile dysfunction due to arterial insufficiency: Secondary | ICD-10-CM | POA: Diagnosis not present

## 2021-02-20 DIAGNOSIS — N401 Enlarged prostate with lower urinary tract symptoms: Secondary | ICD-10-CM | POA: Diagnosis not present

## 2021-03-28 DIAGNOSIS — R053 Chronic cough: Secondary | ICD-10-CM | POA: Diagnosis not present

## 2021-04-14 ENCOUNTER — Ambulatory Visit (INDEPENDENT_AMBULATORY_CARE_PROVIDER_SITE_OTHER): Payer: PPO

## 2021-04-14 ENCOUNTER — Encounter: Payer: Self-pay | Admitting: Emergency Medicine

## 2021-04-14 ENCOUNTER — Ambulatory Visit: Payer: PPO | Admitting: Emergency Medicine

## 2021-04-14 DIAGNOSIS — R059 Cough, unspecified: Secondary | ICD-10-CM | POA: Diagnosis not present

## 2021-04-14 DIAGNOSIS — R053 Chronic cough: Secondary | ICD-10-CM | POA: Diagnosis not present

## 2021-04-14 MED ORDER — LORATADINE 10 MG PO TABS: 10.0000 mg | ORAL_TABLET | Freq: Every day | ORAL | 11 refills | Status: DC

## 2021-04-14 NOTE — Patient Instructions (Addendum)
We will repeat your chest x-ray today ?We will perform pulmonary function testing in next office visit. ?Please continue your omeprazole 40 mg twice a day.  Take this medication 1 hour around food. ?Continue your Pepcid as you have been taking it ?Hold off on the apple cider vinegar for now ?Try starting loratadine 10 mg (generic Claritin) once daily.  You can get this inexpensively at [facility] or [facility] outpatient pharmacies ?Usual Tussionex as needed to suppress your cough, especially at night ?We may decide to pursue either laryngoscopy or bronchoscopy at some point in the future depending on how the cough progresses ?Follow Dr. Lamonte Sakai next available after your pulmonary function testing to review together.   ?

## 2021-04-14 NOTE — Progress Notes (Signed)
? ?Subjective:  ? ? Patient ID: Jason Blackwell, male    DOB: 05/25/1945, 76 y.o.   MRN: [id number] ? ? ?HPI ?76 year old never smoker with a history of CAD/PTCA, hypertension, renal calculi, hyperlipidemia, BPH, osteoarthritis, malignant melanoma of the scalp.  He had COVID-19 in 01/2019.  He is referred today for chronic cough. ?He reports that he was well, no cough over 1.5 yrs ago. Flu-like sx. He has had progressive cough since then, became noticeably bothersome about 3-4 months later. Cough has been dry. Worst at night and seemed positional. Seemed to get some relief from PPI, was increased, also added pepcid. He has gotten relief from tussionex. He also added apple cider vinegar, vegetable pills. He coughs more w dust exposure.  ? ?He was seen by Dr. Redmond Baseman with ENT 10/11/2020, some of the history suggest that there may have been a connection to GERD, chronic rhinitis.  His PPI was increased to twice daily.  I do not see that he had a laryngoscopy ? ?CXR 04/11/20 reviewed, no abnormality.  ? ? ?Review of Systems ?As per HPI ? ?Past Medical History:  ?Diagnosis Date  ? Arthritis   ? right knee  ? BPH (benign prostatic hypertrophy)   ? Colon adenoma   ? Complication of anesthesia   ? Respiratory issues with Succinylcholine  ? Coronary artery disease   ? 3 stent  ? Difficult intubation   ? Ejection fraction   ? Erectile dysfunction   ? Fecal incontinence   ? Heart murmur 2008  ? LVH, diastolic dysfunction, aortic sclerosis  ? Hemorrhoids   ? Hyperlipidemia   ? Hypertension   ? Insomnia   ? Kidney stones   ? "passed them"  ?  ? ?Family History  ?Problem Relation Age of Onset  ? Heart disease Mother   ? Heart attack Father   ? Diabetes Sister   ? Cancer - Colon Maternal Uncle   ?  ? ?Social History  ? ?Socioeconomic History  ? Marital status: Married  ?  Spouse name: Not on file  ? Number of children: Not on file  ? Years of education: Not on file  ? Highest education level: Not on file  ?Occupational History  ? Not  on file  ?Tobacco Use  ? Smoking status: Never  ? Smokeless tobacco: Never  ?Vaping Use  ? Vaping Use: Never used  ?Substance and Sexual Activity  ? Alcohol use: Yes  ?  Alcohol/week: 28.0 standard drinks  ?  Types: 28 Glasses of wine per week  ?  Comment: occas.  ? Drug use: No  ? Sexual activity: Yes  ?Other Topics Concern  ? Not on file  ?Social History Narrative  ? Not on file  ? ?Social Determinants of Health  ? ?Financial Resource Strain: Not on file  ?Food Insecurity: Not on file  ?Transportation Needs: Not on file  ?Physical Activity: Not on file  ?Stress: Not on file  ?Social Connections: Not on file  ?Intimate Partner Violence: Not on file  ?  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 ?Allergies  ?Allergen Reactions  ? Simvastatin Other (See Comments)  ?  Severe muscle fatigue ? ?ALL STATINS  ? Succinylcholine Chloride Other (See Comments)  ?  Respiratory problems  ? Sulfa Antibiotics Itching  ? Sulfamethoxazole Itching  ?    ?  ?  ? ?Outpatient Medications Prior to Visit  ?Medication Sig Dispense Refill  ? acetaminophen (  TYLENOL) 500 MG tablet Take 1,000 mg by mouth See admin instructions. Take 2 tablets (1000 mg) by mouth in the morning (scheduled) & may take an additional 2 tablets (1000 mg) by mouth if needed for pain.    ? amLODipine (NORVASC) 10 MG tablet Take 10 mg by mouth daily.    ? cephALEXin (KEFLEX) 500 MG capsule Take 1 capsule (500 mg total) by mouth 2 (two) times daily. 10 capsule 0  ? chlorpheniramine-HYDROcodone (TUSSIONEX) 10-8 MG/5ML SUER SMARTSIG:5 Milliliter(s) By Mouth Every 12 Hours PRN    ? chlorthalidone (HYGROTON) 25 MG tablet TAKE 1 TABLET (25 MG TOTAL) BY MOUTH DAILY. 90 tablet 0  ? famotidine (PEPCID) 20 MG tablet Take 20 mg by mouth every evening.    ? HYDROcodone-acetaminophen (NORCO/VICODIN) 5-325 MG tablet Take 1-2 tablets by mouth every 4 (four) hours as needed for moderate pain. 10 tablet 0  ? omeprazole (PRILOSEC) 40 MG capsule Take  40 mg by mouth in the morning and at bedtime.    ? OVER THE COUNTER MEDICATION Take 1 Scoop by mouth daily. Super beets    ? REPATHA SURECLICK 557 MG/ML SOAJ INJECT 140 MG INTO THE SKIN EVERY 14 (FOURTEEN) DAYS. 6 mL 3  ? tadalafil (CIALIS) 5 MG tablet Take 5 mg by mouth daily.    ? telmisartan (MICARDIS) 80 MG tablet Take 80 mg by mouth daily.    ? ?No facility-administered medications prior to visit.  ? ? ? ?   ?Objective:  ? Physical Exam ?Vitals:  ? 04/14/21 1533  ?BP: 130/80  ?Pulse: 71  ?Temp: 98.2 ?F (36.8 ?C)  ?TempSrc: Oral  ?SpO2: 97%  ?Weight: 240 lb (108.9 kg)  ?Height: '5\' 10"'$  (1.778 m)  ? ?Gen: Pleasant, well-nourished, in no distress,  normal affect ? ?ENT: No lesions,  mouth clear,  oropharynx clear, no postnasal drip ? ?Neck: No JVD, no stridor ? ?Lungs: No use of accessory muscles, no crackles or wheezing on normal respiration, no wheeze on forced expiration ? ?Cardiovascular: RRR, heart sounds normal, no murmur or gallops, no peripheral edema ? ?Musculoskeletal: No deformities, no cyanosis or clubbing ? ?Neuro: alert, awake, non focal ? ?Skin: Warm, no lesions or rash ? ? ?   ?Assessment & Plan:  ? ?Chronic cough ?We will repeat your chest x-ray today ?We will perform pulmonary function testing in next office visit. ?Please continue your omeprazole 40 mg twice a day.  Take this medication 1 hour around food. ?Continue your Pepcid as you have been taking it ?Hold off on the apple cider vinegar for now ?Try starting loratadine 10 mg (generic Claritin) once daily.  You can get this inexpensively at [facility] or [facility] outpatient pharmacies ?Usual Tussionex as needed to suppress your cough, especially at night ?We may decide to pursue either laryngoscopy or bronchoscopy at some point in the future depending on how the cough progresses ?Follow Dr. Lamonte Sakai next available after your pulmonary function testing to review together.   ? ? ?Baltazar Apo, MD, PhD ?04/14/2021, 4:13 PM ?[facility] ?[phone] or if no answer before 7:00PM call [phone] ?For any issues after 7:00PM please call eLink [phone] ? ?

## 2021-04-14 NOTE — Assessment & Plan Note (Signed)
We will repeat your chest x-ray today ?We will perform pulmonary function testing in next office visit. ?Please continue your omeprazole 40 mg twice a day.  Take this medication 1 hour around food. ?Continue your Pepcid as you have been taking it ?Hold off on the apple cider vinegar for now ?Try starting loratadine 10 mg (generic Claritin) once daily.  You can get this inexpensively at [facility] or [facility] outpatient pharmacies ?Usual Tussionex as needed to suppress your cough, especially at night ?We may decide to pursue either laryngoscopy or bronchoscopy at some point in the future depending on how the cough progresses ?Follow Dr. Lamonte Sakai next available after your pulmonary function testing to review together.   ?

## 2021-04-25 DIAGNOSIS — R053 Chronic cough: Secondary | ICD-10-CM | POA: Diagnosis not present

## 2021-05-05 DIAGNOSIS — I2510 Atherosclerotic heart disease of native coronary artery without angina pectoris: Secondary | ICD-10-CM | POA: Diagnosis not present

## 2021-05-05 DIAGNOSIS — M1711 Unilateral primary osteoarthritis, right knee: Secondary | ICD-10-CM | POA: Diagnosis not present

## 2021-05-05 DIAGNOSIS — R053 Chronic cough: Secondary | ICD-10-CM | POA: Diagnosis not present

## 2021-05-05 DIAGNOSIS — Z0000 Encounter for general adult medical examination without abnormal findings: Secondary | ICD-10-CM | POA: Diagnosis not present

## 2021-05-05 DIAGNOSIS — D649 Anemia, unspecified: Secondary | ICD-10-CM | POA: Diagnosis not present

## 2021-05-05 DIAGNOSIS — Z1389 Encounter for screening for other disorder: Secondary | ICD-10-CM | POA: Diagnosis not present

## 2021-05-05 DIAGNOSIS — Z6836 Body mass index (BMI) 36.0-36.9, adult: Secondary | ICD-10-CM | POA: Diagnosis not present

## 2021-05-05 DIAGNOSIS — Z85820 Personal history of malignant melanoma of skin: Secondary | ICD-10-CM | POA: Diagnosis not present

## 2021-05-05 DIAGNOSIS — E7800 Pure hypercholesterolemia, unspecified: Secondary | ICD-10-CM | POA: Diagnosis not present

## 2021-05-05 DIAGNOSIS — I10 Essential (primary) hypertension: Secondary | ICD-10-CM | POA: Diagnosis not present

## 2021-05-15 ENCOUNTER — Ambulatory Visit: Payer: PPO | Admitting: Orthopaedic Surgery

## 2021-05-15 ENCOUNTER — Encounter: Payer: Self-pay | Admitting: Orthopaedic Surgery

## 2021-05-15 ENCOUNTER — Ambulatory Visit (INDEPENDENT_AMBULATORY_CARE_PROVIDER_SITE_OTHER): Payer: PPO

## 2021-05-15 VITALS — Ht 70.0 in | Wt 238.0 lb

## 2021-05-15 DIAGNOSIS — G8929 Other chronic pain: Secondary | ICD-10-CM | POA: Diagnosis not present

## 2021-05-15 DIAGNOSIS — M1711 Unilateral primary osteoarthritis, right knee: Secondary | ICD-10-CM

## 2021-05-15 DIAGNOSIS — M25561 Pain in right knee: Secondary | ICD-10-CM | POA: Diagnosis not present

## 2021-05-15 NOTE — Progress Notes (Signed)
? ?Office Visit Note ?  ?Patient: Jason Blackwell           ?Date of Birth: 1945/09/01           ?MRN: [id number] ?Visit Date: 05/15/2021 ?             ?Requested by: Lavone Orn, MD ?[place] ?[address] ?[place],  [place] [address] ?PCP: Lavone Orn, MD ? ? ?Assessment & Plan: ?Visit Diagnoses:  ?1. Chronic pain of right knee   ?2. Unilateral primary osteoarthritis, right knee   ? ? ?Plan: We spoke in length in detail about knee replacement surgery.  I showed him a knee replacement model and went over his x-rays with him.  We talked about what to expect from an intraoperative and postoperative course.  I discussed the risks and benefits of this type of surgery.  Given the failure of conservative treatment for 2 years now and given his clinical exam findings combined with his x-ray findings of his right knee osteoarthritis, I agree that he is a candidate for knee replacement surgery.  We will work on getting this scheduled in the near future hopefully. ? ?Follow-Up Instructions: Return for 2 weeks post-op.  ? ?Orders:  ?Orders Placed This Encounter  ?Procedures  ? XR Knee 1-2 Views Right  ? ?No orders of the defined types were placed in this encounter. ? ? ? ? Procedures: ?No procedures performed ? ? ?Clinical Data: ?No additional findings. ? ? ?Subjective: ?Chief Complaint  ?Patient presents with  ? Right Knee - Pain  ?The patient comes in today hoping to discuss knee replacement surgery.  He is a very active 76 year old gentleman and does like to exercise walk a lot.  He injured his right knee 2 years ago and is been hurting quite a bit since then along the medial joint line.  He has tried steroid injections for his right knee which helped a little bit.  He has also had hyaluronic acid but only helped a little bit.  He does take Tylenol in the morning he does wake up with a very stiff knee.  At this point his right knee pain is getting worse.  It is detrimentally affecting his mobility, his quality of  life and his actives daily living.  His BMI is 34.15.  He is not on blood thinning medication and is not diabetic.  At this point he is interested in knee replacement surgery given the failure of conservative treatment and the pain that seems to be slowly worsening with his knee as well as stiffness.  He feels that he has put on weight given the fact that he is less mobile as he is dealt with his painful right knee. ? ?HPI ? ?Review of Systems ?There is currently listed no fever, chills, nausea, vomiting ? ?Objective: ?Vital Signs: Ht '5\' 10"'$  (1.778 m)   Wt 238 lb (108 kg)   BMI 34.15 kg/m?  ? ?Physical Exam ?He is alert and orient x3 and in no acute distress ?Ortho Exam ?His right knee is examined today and shows no effusion.  There is varus malalignment that is correctable.  He has good range of motion of the knee but there is patellofemoral crepitation and significant medial joint line tenderness. ?Specialty Comments:  ?No specialty comments available. ? ?Imaging: ?XR Knee 1-2 Views Right ? ?Result Date: 05/15/2021 ?2 views of the right knee show tricompartment osteoarthritis.  There is valgus malalignment.  There is significant medial joint space narrowing and  patellofemoral narrowing.  There are small osteophytes in all 3 compartments.  ? ? ?PMFS History: ?Patient Active Problem List  ? Diagnosis Date Noted  ? Unilateral primary osteoarthritis, right knee 05/15/2021  ? Chronic cough 04/14/2021  ? 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 Enlarged prostate with urinary obstruction 07/09/2019  ? Lesion of external ear, left 08/23/2015  ? Hemorrhoids 10/11/2014  ? CAD (coronary artery disease), native coronary artery 01/17/2014  ? Erectile dysfunction 01/17/2014  ? Allergy to statin medication 12/30/2013  ? Elevated PSA 12/30/2013  ? Aortic stenosis 12/30/2013  ? Ejection fraction   ? Hyperlipidemia 07/29/2006  ? Essential hypertension 07/29/2006  ? BENIGN PROSTATIC HYPERTROPHY 07/29/2006  ? NEPHROLITHIASIS,  HX OF 07/29/2006  ? ?Past Medical History:  ?Diagnosis Date  ? Arthritis   ? right knee  ? BPH (benign prostatic hypertrophy)   ? Colon adenoma   ? Complication of anesthesia   ? Respiratory issues with Succinylcholine  ? Coronary artery disease   ? 3 stent  ? Difficult intubation   ? Ejection fraction   ? Erectile dysfunction   ? Fecal incontinence   ? Heart murmur 2008  ? LVH, diastolic dysfunction, aortic sclerosis  ? Hemorrhoids   ? Hyperlipidemia   ? Hypertension   ? Insomnia   ? Kidney stones   ? "passed them"  ?  ?Family History  ?Problem Relation Age of Onset  ? Heart disease Mother   ? Heart attack Father   ? Diabetes Sister   ? Cancer - Colon Maternal Uncle   ?  ?Past Surgical History:  ?Procedure Laterality Date  ? CATARACT EXTRACTION W/PHACO Right 09/24/2018  ? Procedure: CATARACT EXTRACTION PHACO AND INTRAOCULAR LENS PLACEMENT (IOC)COMPLICATED RIGHT;  Surgeon: Leandrew Koyanagi, MD;  Location: [facility];  Service: Ophthalmology;  Laterality: Right;  MAYLUGIN  ? CATARACT EXTRACTION W/PHACO Left 10/15/2018  ? Procedure: CATARACT EXTRACTION PHACO AND INTRAOCULAR LENS PLACEMENT (IOC) RIGHT  01:16.3  17.9%  13.79;  Surgeon: Leandrew Koyanagi, MD;  Location: [facility];  Service: Ophthalmology;  Laterality: Left;  ? CORONARY ANGIOPLASTY WITH STENT PLACEMENT  12/31/2013  ? "3"  ? LEFT HEART CATHETERIZATION WITH CORONARY ANGIOGRAM N/A 12/31/2013  ? Procedure: LEFT HEART CATHETERIZATION WITH CORONARY ANGIOGRAM;  Surgeon: Sinclair Grooms, MD;  Location: [facility] CATH LAB;  Service: Cardiovascular;  Laterality: N/A; Ostial and mid LAD stenting  ? PROSTATE BIOPSY  ~ 1991  ? SKIN LESION EXCISION Right 2013  ? precancerous lesion from arm  ? TONSILLECTOMY    ? TRANSURETHRAL RESECTION OF PROSTATE N/A 07/09/2019  ? Procedure: TRANSURETHRAL RESECTION OF THE PROSTATE (TURP);  Surgeon: Franchot Gallo, MD;  Location: [facility];  Service: Urology;  Laterality: N/A;  ? ?Social History  ? ?Occupational  History  ? Not on file  ?Tobacco Use  ? Smoking status: Never  ? Smokeless tobacco: Never  ?Vaping Use  ? Vaping Use: Never used  ?Substance and Sexual Activity  ? Alcohol use: Yes  ?  Alcohol/week: 28.0 standard drinks  ?  Types: 28 Glasses of wine per week  ?  Comment: occas.  ? Drug use: No  ? Sexual activity: Yes  ? ? ? ? ? ? ?

## 2021-05-19 DIAGNOSIS — Z961 Presence of intraocular lens: Secondary | ICD-10-CM | POA: Diagnosis not present

## 2021-06-01 DIAGNOSIS — R053 Chronic cough: Secondary | ICD-10-CM | POA: Diagnosis not present

## 2021-06-05 ENCOUNTER — Other Ambulatory Visit: Payer: Self-pay

## 2021-06-19 DIAGNOSIS — D485 Neoplasm of uncertain behavior of skin: Secondary | ICD-10-CM | POA: Diagnosis not present

## 2021-06-19 DIAGNOSIS — Z85820 Personal history of malignant melanoma of skin: Secondary | ICD-10-CM | POA: Diagnosis not present

## 2021-06-19 DIAGNOSIS — Z85828 Personal history of other malignant neoplasm of skin: Secondary | ICD-10-CM | POA: Diagnosis not present

## 2021-06-19 DIAGNOSIS — X32XXXA Exposure to sunlight, initial encounter: Secondary | ICD-10-CM | POA: Diagnosis not present

## 2021-06-19 DIAGNOSIS — D2261 Melanocytic nevi of right upper limb, including shoulder: Secondary | ICD-10-CM | POA: Diagnosis not present

## 2021-06-19 DIAGNOSIS — L820 Inflamed seborrheic keratosis: Secondary | ICD-10-CM | POA: Diagnosis not present

## 2021-06-19 DIAGNOSIS — L570 Actinic keratosis: Secondary | ICD-10-CM | POA: Diagnosis not present

## 2021-06-19 DIAGNOSIS — D2271 Melanocytic nevi of right lower limb, including hip: Secondary | ICD-10-CM | POA: Diagnosis not present

## 2021-06-19 DIAGNOSIS — D0439 Carcinoma in situ of skin of other parts of face: Secondary | ICD-10-CM | POA: Diagnosis not present

## 2021-06-19 DIAGNOSIS — D0359 Melanoma in situ of other part of trunk: Secondary | ICD-10-CM | POA: Diagnosis not present

## 2021-06-19 DIAGNOSIS — D225 Melanocytic nevi of trunk: Secondary | ICD-10-CM | POA: Diagnosis not present

## 2021-06-20 DIAGNOSIS — D500 Iron deficiency anemia secondary to blood loss (chronic): Secondary | ICD-10-CM | POA: Diagnosis not present

## 2021-06-27 ENCOUNTER — Ambulatory Visit (INDEPENDENT_AMBULATORY_CARE_PROVIDER_SITE_OTHER): Payer: PPO | Admitting: Emergency Medicine

## 2021-06-27 ENCOUNTER — Encounter: Payer: Self-pay | Admitting: Emergency Medicine

## 2021-06-27 DIAGNOSIS — R053 Chronic cough: Secondary | ICD-10-CM

## 2021-06-27 LAB — PULMONARY FUNCTION TEST
DL/VA % pred: 150 %
DL/VA: 5.95 ml/min/mmHg/L
DLCO cor % pred: 129 %
DLCO cor: 32.38 ml/min/mmHg
DLCO unc % pred: 129 %
DLCO unc: 32.38 ml/min/mmHg
FEF 25-75 Post: 2.35 L/sec
FEF 25-75 Pre: 2.59 L/sec
FEF2575-%Change-Post: -9 %
FEF2575-%Pred-Post: 108 %
FEF2575-%Pred-Pre: 118 %
FEV1-%Change-Post: 0 %
FEV1-%Pred-Post: 94 %
FEV1-%Pred-Pre: 95 %
FEV1-Post: 2.87 L
FEV1-Pre: 2.88 L
FEV1FVC-%Change-Post: -2 %
FEV1FVC-%Pred-Pre: 109 %
FEV6-%Change-Post: 1 %
FEV6-%Pred-Post: 94 %
FEV6-%Pred-Pre: 92 %
FEV6-Post: 3.7 L
FEV6-Pre: 3.64 L
FEV6FVC-%Change-Post: 0 %
FEV6FVC-%Pred-Post: 106 %
FEV6FVC-%Pred-Pre: 106 %
FVC-%Change-Post: 1 %
FVC-%Pred-Post: 88 %
FVC-%Pred-Pre: 86 %
FVC-Post: 3.72 L
FVC-Pre: 3.65 L
Post FEV1/FVC ratio: 77 %
Post FEV6/FVC ratio: 100 %
Pre FEV1/FVC ratio: 79 %
Pre FEV6/FVC Ratio: 100 %
RV % pred: 88 %
RV: 2.29 L
TLC % pred: 91 %
TLC: 6.41 L

## 2021-06-27 NOTE — Progress Notes (Signed)
Subjective:    Patient ID: Jason Blackwell, male    DOB: July 08, 1945, 76 y.o.   MRN: [id number]  HPI 76 year old never smoker with a history of CAD/PTCA, hypertension, renal calculi, hyperlipidemia, BPH, osteoarthritis, malignant melanoma of the scalp.  He had COVID-19 in 01/2019.  He is referred today for chronic cough. He reports that he was well, no cough over 1.5 yrs ago. Flu-like sx. He has had progressive cough since then, became noticeably bothersome about 3-4 months later. Cough has been dry. Worst at night and seemed positional. Seemed to get some relief from PPI, was increased, also added pepcid. He has gotten relief from tussionex. He also added apple cider vinegar, vegetable pills. He coughs more w dust exposure.   He was seen by Dr. Redmond Baseman with ENT 10/11/2020, some of the history suggest that there may have been a connection to GERD, chronic rhinitis.  His PPI was increased to twice daily.  I do not see that he had a laryngoscopy  CXR 04/11/20 reviewed, no abnormality.    ROV 06/27/2021 --Jason Blackwell is 76, never smoker with hypertension, CAD, hyperlipidemia, OA, scalp melanoma.  I have seen him for chronic cough that began approximately 2 years ago.  He had a reassuring chest x-ray at our last visit.  He went for pulmonary function testing today. I asked him to continue omeprazole twice daily and Pepcid, stop apple cider vinegar.  Also started loratadine.  He underwent pulmonary function testing today Today he reports that overall his cough is better - he is not using the tussionex. He is still having it at night > when he gets up at night he will often have a spell. Loratadine didn't really change anything. He has given up chocolate, red sauce, vinegar.   Pulmonary function testing performed today and reviewed by me, shows normal airflows without a bronchodilator response, normal flow volume loop, normal lung volumes, normal diffusion capacity.   Review of Systems As per HPI  Past  Medical History:  Diagnosis Date   Arthritis    right knee   BPH (benign prostatic hypertrophy)    Colon adenoma    Complication of anesthesia    Respiratory issues with Succinylcholine   Coronary artery disease    3 stent   Difficult intubation    Ejection fraction    Erectile dysfunction    Fecal incontinence    Heart murmur 7846   LVH, diastolic dysfunction, aortic sclerosis   Hemorrhoids    Hyperlipidemia    Hypertension    Insomnia    Kidney stones    "passed them"     Family History  Problem Relation Age of Onset   Heart disease Mother    Heart attack Father    Diabetes Sister    Cancer - Colon Maternal Uncle      Social History   Socioeconomic History   Marital status: Married    Spouse name: Not on file   Number of children: Not on file   Years of education: Not on file   Highest education level: Not on file  Occupational History   Not on file  Tobacco Use   Smoking status: Never   Smokeless tobacco: Never  Vaping Use   Vaping Use: Never used  Substance and Sexual Activity   Alcohol use: Yes    Alcohol/week: 28.0 standard drinks of alcohol    Types: 28 Glasses of wine per week    Comment: occas.   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 Take 2 tablets (1000 mg) by mouth in the morning (scheduled) & may take an additional 2 tablets (1000 mg) by mouth if needed for pain.     amLODipine (NORVASC) 10 MG tablet Take 10 mg by mouth daily.     cephALEXin (KEFLEX) 500 MG capsule Take 1 capsule (500 mg total) by mouth 2 (two) times daily. 10 capsule 0   chlorpheniramine-HYDROcodone (TUSSIONEX) 10-8 MG/5ML SUER SMARTSIG:5 Milliliter(s) By Mouth Every 12 Hours PRN     chlorthalidone (HYGROTON) 25 MG tablet TAKE 1 TABLET (25 MG TOTAL) BY MOUTH DAILY. 90 tablet 0   famotidine (PEPCID) 20 MG tablet Take 20 mg by mouth every evening.     omeprazole (PRILOSEC) 40 MG capsule Take 40 mg by mouth in the morning and at bedtime.     OVER THE COUNTER MEDICATION Take 1 Scoop by mouth daily. Super beets     REPATHA SURECLICK 338 MG/ML SOAJ INJECT 140 MG INTO THE SKIN EVERY 14 (FOURTEEN) DAYS. 6 mL 3   tadalafil (CIALIS) 5 MG tablet Take 5 mg by mouth daily.     telmisartan (MICARDIS) 80 MG tablet Take 80 mg by mouth daily.     HYDROcodone-acetaminophen (NORCO/VICODIN) 5-325 MG tablet Take 1-2 tablets by mouth every 4 (four) hours as needed for moderate pain. 10 tablet 0   loratadine (CLARITIN) 10 MG tablet Take 1 tablet (10 mg total) by mouth daily. 30 tablet 11   No facility-administered medications prior to visit.        Objective:   Physical Exam Vitals:   06/27/21 1111  BP: 118/72  Pulse: 72  Temp: 98.7 F (37.1 C)  TempSrc: Oral  SpO2: 97%  Weight: 239 lb 12.8 oz (108.8 kg)  Height: '5\' 10"'$  (1.778 m)   Gen: Pleasant, overweight gentleman, in no distress,  normal affect  ENT: No lesions,  mouth clear,  oropharynx clear, no postnasal drip  Neck: No JVD, mild stridor on forced expiration  Lungs: No use of accessory muscles, no crackles or wheezing on normal respiration, some referred upper airway noise on forced  expiration  Cardiovascular: RRR, heart sounds normal, no murmur or gallops, no peripheral edema  Musculoskeletal: No deformities, no cyanosis or clubbing  Neuro: alert, awake, non focal  Skin: Warm, no lesions or rash      Assessment & Plan:   Chronic cough Normal PFTs.  Reassuring chest x-ray.  He is slowly improving, suspect that GERD is the principal driving factor for upper airway irritation and cough.  No longer requiring  Tussionex.  He does still cough at night.  We reviewed your pulmonary function testing and your chest x-ray today.  These are both normal.  Good news. Please continue your omeprazole as you have been taking them Continue Pepcid each evening Congratulations on modifying your diet to avoid foods that will cause esophageal reflux.  I do believe that this will be helpful.  Avoid eating after 8 PM Try to sleep with the head of your bed slightly elevated If your cough begins to flare then we should consider other evaluation including possible CT scan of the chest, possible bronchoscopy and airway inspection.  We can hold off on these tests for now. Follow Dr. Lamonte Sakai if you have any flaring of your cough or any new respiratory issues.   Baltazar Apo, MD, PhD 06/27/2021, 11:47 AM [facility] [phone] or if no answer before 7:00PM call [phone] For any issues after 7:00PM please call eLink [phone]

## 2021-06-27 NOTE — Progress Notes (Signed)
Full PFT Performed Today  

## 2021-06-27 NOTE — Assessment & Plan Note (Signed)
Normal PFTs.  Reassuring chest x-ray.  He is slowly improving, suspect that GERD is the principal driving factor for upper airway irritation and cough.  No longer requiring Tussionex.  He does still cough at night.  We reviewed your pulmonary function testing and your chest x-ray today.  These are both normal.  Good news. Please continue your omeprazole as you have been taking them Continue Pepcid each evening Congratulations on modifying your diet to avoid foods that will cause esophageal reflux.  I do believe that this will be helpful.  Avoid eating after 8 PM Try to sleep with the head of your bed slightly elevated If your cough begins to flare then we should consider other evaluation including possible CT scan of the chest, possible bronchoscopy and airway inspection.  We can hold off on these tests for now. Follow Dr. Lamonte Sakai if you have any flaring of your cough or any new respiratory issues.

## 2021-06-27 NOTE — Patient Instructions (Signed)
We reviewed your pulmonary function testing and your chest x-ray today.  These are both normal.  Good news. Please continue your omeprazole as you have been taking them Continue Pepcid each evening Congratulations on modifying your diet to avoid foods that will cause esophageal reflux.  I do believe that this will be helpful.  Avoid eating after 8 PM Try to sleep with the head of your bed slightly elevated If your cough begins to flare then we should consider other evaluation including possible CT scan of the chest, possible bronchoscopy and airway inspection.  We can hold off on these tests for now. Follow Dr. Lamonte Sakai if you have any flaring of your cough or any new respiratory issues.

## 2021-06-27 NOTE — Patient Instructions (Signed)
Full PFT Performed Today  

## 2021-06-28 ENCOUNTER — Other Ambulatory Visit: Payer: Self-pay | Admitting: Physician Assistant

## 2021-06-28 DIAGNOSIS — M1711 Unilateral primary osteoarthritis, right knee: Secondary | ICD-10-CM

## 2021-07-11 NOTE — Patient Instructions (Addendum)
DUE TO COVID-19 ONLY TWO VISITORS  (aged 76 and older)  ARE ALLOWED TO COME WITH YOU AND STAY IN THE WAITING ROOM ONLY DURING PRE OP AND PROCEDURE.   **NO VISITORS ARE ALLOWED IN THE SHORT STAY AREA OR RECOVERY ROOM!!**  IF YOU WILL BE ADMITTED INTO THE HOSPITAL YOU ARE ALLOWED ONLY FOUR SUPPORT PEOPLE DURING VISITATION HOURS ONLY (7 AM -8PM)   The support person(s) must pass our screening, gel in and out, and wear a mask at all times, including in the patient's room. Patients must also wear a mask when staff or their support person are in the room. Visitors GUEST BADGE MUST BE WORN VISIBLY  One adult visitor may remain with you overnight and MUST be in the room by 8 P.M.     Your procedure is scheduled on: 07/21/21   Report to [facility] Main Entrance    Report to admitting at   7:45 AM   Call this number if you have problems the morning of surgery [phone]   Do not eat food :After Midnight.   After Midnight you may have the following liquids until _5:00_AM DAY OF SURGERY  Water Black Coffee (sugar ok, NO MILK/CREAM OR CREAMERS)  Tea (sugar ok, NO MILK/CREAM OR CREAMERS) regular and decaf                             Plain Jell-O (NO RED)                                           Fruit ices (not with fruit pulp, NO RED)                                     Popsicles (NO RED)                                                                  Juice: apple, WHITE grape, WHITE cranberry Sports drinks like Gatorade (NO RED) Clear broth(vegetable,chicken,beef)                    The day of surgery:  Drink ONE (1) Pre-Surgery Clear Ensure at 4:45 AM the morning of surgery. Drink in one sitting. Do not sip.  This drink was given to you during your hospital  pre-op appointment visit. Nothing else to drink after completing the  Pre-Surgery Clear Ensure at 5:00 am          If you have questions, please contact your surgeon's office.       Oral Hygiene is also important  to reduce your risk of infection.                                    Remember - BRUSH YOUR TEETH THE MORNING OF SURGERY WITH YOUR REGULAR TOOTHPASTE   Do NOT smoke after Midnight   Take these medicines the morning of surgery with A SIP OF WATER: Amlodipine, Omeprazole  Before surgery.Stop taking _ASA 81__________on __________as instructed by _____________.    Contact your Surgeon/Cardiologist for instructions on Anticoagulant Therapy prior to surgery.    Bring CPAP mask and tubing day of surgery.                              You may not have any metal on your body including  jewelry, and body piercing             Do not wear lotions, powders, perfumes/cologne, or deodorant                Men may shave face and neck.   Do not bring valuables to the hospital. [place].   Contacts, dentures or bridgework may not be worn into surgery.   Bring small overnight bag day of surgery.   DO NOT [place]. PHARMACY WILL DISPENSE MEDICATIONS LISTED ON YOUR MEDICATION LIST TO YOU DURING YOUR ADMISSION [facility]!      Special Instructions: Bring a copy of your healthcare power of attorney and living will documents the day of surgery if you haven't scanned them before.              Please read over the following fact sheets you were given: IF YOU HAVE QUESTIONS ABOUT YOUR PRE-OP INSTRUCTIONS PLEASE CALL [phone]     [facility] - Preparing for Surgery Before surgery, you can play an important role.  Because skin is not sterile, your skin needs to be as free of germs as possible.  You can reduce the number of germs on your skin by washing with CHG (chlorahexidine gluconate) soap before surgery.  CHG is an antiseptic cleaner which kills germs and bonds with the skin to continue killing germs even after washing. Please DO NOT use if you have an allergy to CHG or antibacterial soaps.  If your skin  becomes reddened/irritated stop using the CHG and inform your nurse when you arrive at Short Stay. You may shave your face/neck. Please follow these instructions carefully:  1.  Shower with CHG Soap the night before surgery and the  morning of Surgery.  2.  If you choose to wash your hair, wash your hair first as usual with your  normal  shampoo.  3.  After you shampoo, rinse your hair and body thoroughly to remove the  shampoo.                            4.  Use CHG as you would any other liquid soap.  You can apply chg directly  to the skin and wash                       Gently with a scrungie or clean washcloth.  5.  Apply the CHG Soap to your body ONLY FROM THE NECK DOWN.   Do not use on face/ open                           Wound or open sores. Avoid contact with eyes, ears mouth and genitals (private parts).  Wash face,  Genitals (private parts) with your normal soap.             6.  Wash thoroughly, paying special attention to the area where your surgery  will be performed.  7.  Thoroughly rinse your body with warm water from the neck down.  8.  DO NOT shower/wash with your normal soap after using and rinsing off  the CHG Soap.                9.  Pat yourself dry with a clean towel.            10.  Wear clean pajamas.            11.  Place clean sheets on your bed the night of your first shower and do not  sleep with pets. Day of Surgery : Do not apply any lotions/deodorants the morning of surgery.  Please wear clean clothes to the hospital/surgery center.  FAILURE TO FOLLOW THESE INSTRUCTIONS MAY RESULT IN THE CANCELLATION OF YOUR SURGERY     ________________________________________________________________________   Incentive Spirometer  An incentive spirometer is a tool that can help keep your lungs clear and active. This tool measures how well you are filling your lungs with each breath. Taking long deep breaths may help reverse or decrease the chance of  developing breathing (pulmonary) problems (especially infection) following: A long period of time when you are unable to move or be active. BEFORE THE PROCEDURE  If the spirometer includes an indicator to show your best effort, your nurse or respiratory therapist will set it to a desired goal. If possible, sit up straight or lean slightly forward. Try not to slouch. Hold the incentive spirometer in an upright position. INSTRUCTIONS FOR USE  Sit on the edge of your bed if possible, or sit up as far as you can in bed or on a chair. Hold the incentive spirometer in an upright position. Breathe out normally. Place the mouthpiece in your mouth and seal your lips tightly around it. Breathe in slowly and as deeply as possible, raising the piston or the ball toward the top of the column. Hold your breath for 3-5 seconds or for as long as possible. Allow the piston or ball to fall to the bottom of the column. Remove the mouthpiece from your mouth and breathe out normally. Rest for a few seconds and repeat Steps 1 through 7 at least 10 times every 1-2 hours when you are awake. Take your time and take a few normal breaths between deep breaths. The spirometer may include an indicator to show your best effort. Use the indicator as a goal to work toward during each repetition. After each set of 10 deep breaths, practice coughing to be sure your lungs are clear. If you have an incision (the cut made at the time of surgery), support your incision when coughing by placing a pillow or rolled up towels firmly against it. Once you are able to get out of bed, walk around indoors and cough well. You may stop using the incentive spirometer when instructed by your caregiver.  RISKS AND COMPLICATIONS Take your time so you do not get dizzy or light-headed. If you are in pain, you may need to take or ask for pain medication before doing incentive spirometry. It is harder to take a deep breath if you are having pain. AFTER  USE Rest and breathe slowly and easily. It can be helpful to keep track of a  log of your progress. Your caregiver can provide you with a simple table to help with this. If you are using the spirometer at home, follow these instructions: [place] IF:  You are having difficultly using the spirometer. You have trouble using the spirometer as often as instructed. Your pain medication is not giving enough relief while using the spirometer. You develop fever of 100.5 F (38.1 C) or higher. SEEK IMMEDIATE MEDICAL CARE IF:  You cough up bloody sputum that had not been present before. You develop fever of 102 F (38.9 C) or greater. You develop worsening pain at or near the incision site. MAKE SURE YOU:  Understand these instructions. Will watch your condition. Will get help right away if you are not doing well or get worse. Document Released: 05/14/2006 Document Revised: 03/26/2011 Document Reviewed: 07/15/2006 [facility] Patient Information 2014 [place], [place].   ________________________________________________________________________

## 2021-07-12 ENCOUNTER — Other Ambulatory Visit: Payer: Self-pay

## 2021-07-12 ENCOUNTER — Encounter (HOSPITAL_COMMUNITY): Payer: Self-pay

## 2021-07-12 ENCOUNTER — Encounter (HOSPITAL_COMMUNITY)
Admission: RE | Admit: 2021-07-12 | Discharge: 2021-07-12 | Disposition: A | Discharge status: Home / Self Care | Payer: PPO | Source: Ambulatory Visit | Attending: Orthopaedic Surgery | Admitting: Orthopaedic Surgery

## 2021-07-12 DIAGNOSIS — Z01818 Encounter for other preprocedural examination: Secondary | ICD-10-CM | POA: Insufficient documentation

## 2021-07-12 DIAGNOSIS — M1711 Unilateral primary osteoarthritis, right knee: Secondary | ICD-10-CM

## 2021-07-12 DIAGNOSIS — I10 Essential (primary) hypertension: Secondary | ICD-10-CM | POA: Diagnosis not present

## 2021-07-12 HISTORY — DX: Gastro-esophageal reflux disease without esophagitis: K21.9

## 2021-07-12 HISTORY — DX: Personal history of urinary calculi: Z87.442

## 2021-07-12 HISTORY — DX: Malignant (primary) neoplasm, unspecified: C80.1

## 2021-07-12 LAB — BASIC METABOLIC PANEL
Anion gap: 9 (ref 5–15)
BUN: 10 mg/dL (ref 8–23)
CO2: 27 mmol/L (ref 22–32)
Calcium: 9.3 mg/dL (ref 8.9–10.3)
Chloride: 100 mmol/L (ref 98–111)
Creatinine, Ser: 0.6 mg/dL — ABNORMAL LOW (ref 0.61–1.24)
GFR, Estimated: >60 mL/min (ref >60)
Glucose, Bld: 107 mg/dL — ABNORMAL HIGH (ref 70–99)
Potassium: 2.9 mmol/L — ABNORMAL LOW (ref 3.5–5.1)
Sodium: 136 mmol/L (ref 135–145)

## 2021-07-12 LAB — CBC
HCT: 44.1 % (ref 39.0–52.0)
Hemoglobin: 14.8 g/dL (ref 13.0–17.0)
MCH: 28.8 pg (ref 26.0–34.0)
MCHC: 33.6 g/dL (ref 30.0–36.0)
MCV: 86 fL (ref 80.0–100.0)
Platelets: 328 10*3/uL (ref 150–400)
RBC: 5.13 MIL/uL (ref 4.22–5.81)
RDW: 21.3 % — ABNORMAL HIGH (ref 11.5–15.5)
WBC: 6.9 10*3/uL (ref 4.0–10.5)
nRBC: 0 % (ref 0.0–0.2)

## 2021-07-12 NOTE — Progress Notes (Signed)
Anesthesia note:  Bowel prep reminder:NA  PCP - Dr. Electa Sniff Cardiologist -Dr. Linard Millers Other-   Chest x-ray - 04/17/21-epic EKG - 07/12/21-chart Stress Test - 2018-epic ECHO - 04/13/19-epic Cardiac Cath - with stents 2015-epic  Pacemaker/ICD device last checked:NA  Sleep Study - no CPAP -   Pt is pre diabetic-NA Fasting Blood Sugar -  Checks Blood Sugar _____  Blood Thinner:ASA 81 mg/ Dr. Tamala Julian Blood Thinner Instructions: Aspirin Instructions:none. Pt will call Dr. Tamala Julian Last Dose:  Anesthesia review: yes  Patient denies shortness of breath, fever, cough and chest pain at PAT appointment Pt reports no SOB with activities. He has had a chronic cough after covid and had a pulmonary function test done 06/27/21  Patient verbalized understanding of instructions that were given to them at the PAT appointment. Patient was also instructed that they will need to review over the PAT instructions again at home before surgery. Yes. His wife was with him

## 2021-07-13 ENCOUNTER — Telehealth: Payer: Self-pay | Admitting: *Deleted

## 2021-07-13 ENCOUNTER — Encounter: Payer: Self-pay | Admitting: Interventional Cardiology

## 2021-07-13 NOTE — Telephone Encounter (Signed)
   Pre-operative Risk Assessment    Patient Name: Jason Blackwell  DOB: July 21, 1945 MRN: [id number]      Request for Surgical Clearance    Procedure:   RIGHT TOTAL KNEE ARTHROPLASTY  Date of Surgery:  Clearance TBD                                 Surgeon:   Surgeon's Group or Practice Name:  [facility] Phone number:  [phone] Fax number:  [phone]  ATTN:  SHERRIE   Type of Clearance Requested:   - Medical  - Pharmacy:  Hold Aspirin NOT INDICATED   Type of Anesthesia:   SPINAL AND BLOCK   Additional requests/questions:    Astrid Divine   07/13/2021, 2:30 PM

## 2021-07-13 NOTE — Progress Notes (Addendum)
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 never smoker with h/o HTN, GERD, CAD (DES 2015), mild aortic stenosis, right knee djd scheduled for above procedure 07/21/2021 with Dr. Jean Rosenthal.   Per previous anesthesia note, "Wears med alert necklace stating pt is difficult airway and intubation, and allergic to succinylcholine"  Pt last seen by cardiology 06/20/2020. Cardiology clearance requested.   Addendum 07/20/2021:  Pt seen by cardiology today.  Per OV note, "He is easily getting greater than 4 METS of activity.  He is exertional shortness of breath is likely due to deconditioning.  He thinks he is limiting himself due to knee problem.  Given past medical history and time since last visit, based on ACC/AHA guidelines, KARLTON MAYA would be at acceptable risk for the planned procedure without further cardiovascular testing."  Anticipate pt can proceed with planned procedure barring acute status change.   VS: BP (!) 156/79   Pulse 62   Temp 36.7 C (Oral)   Resp 18   Ht '5\' 10"'$  (1.778 m)   Wt 107 kg   SpO2 97%   BMI 33.86 kg/m   PROVIDERS: Lavone Orn, MD is PCP    LABS: Labs reviewed: Acceptable for surgery. (all labs ordered are listed, but only abnormal results are displayed)  Labs Reviewed  BASIC METABOLIC PANEL - Abnormal; Notable for the following components:      Result Value   Potassium 2.9 (*)    Glucose, Bld 107 (*)    Creatinine, Ser 0.60 (*)    All other components within normal limits  CBC - Abnormal; Notable for the following components:   RDW 21.3 (*)    All other components within normal limits  TYPE AND SCREEN     IMAGES:   EKG: 07/12/2021 Rate 57 bpm   Sinus bradycardia with 1st degree A-V block Otherwise normal ECG When compared with ECG of 05-Jun-2016 11:21, PREVIOUS ECG IS PRESENT Since last tracing rate slower and RAD has resolved.  CV: Echo 04/13/2019  1. Normal LV systolic function; mild LVH; grade 1 diastolic dysfunction;  calcified aortic valve with reduced excursion of left coronary cusp; mild  AS (mean gradient 15 mmHg).   2. Left ventricular ejection fraction, by estimation, is 60 to 65%. The  left ventricle has normal function. The left ventricle has no regional  wall motion abnormalities. There is mild left ventricular hypertrophy.  Left ventricular diastolic parameters  are consistent with Grade I diastolic dysfunction (impaired relaxation).   3. Right ventricular systolic function is normal. The right ventricular  size is normal.   4. The mitral valve is normal in structure. No evidence of mitral valve  regurgitation. No evidence of mitral stenosis.   5. The aortic valve is tricuspid. Aortic valve regurgitation is not  visualized. Mild aortic valve stenosis.   6. The inferior vena cava is normal in size with greater than 50%  respiratory variability, suggesting right atrial pressure of 3 mmHg.   Myocardial Perfusion 06/05/2016 Nuclear stress EF: 62%. Blood pressure demonstrated a normal response to exercise. There was no ST segment deviation noted during stress. No T wave inversion was noted during stress. The study is normal. This is a  low risk study.   Low risk stress nuclear study with normal perfusion and normal left ventricular regional and global systolic function. Anteroapical ischemia seen in 2015 is no longer present. Past Medical History:  Diagnosis Date   Arthritis    right knee   BPH (benign prostatic hypertrophy)    TURP done   Cancer [facility])    skin   Colon adenoma    Complication of anesthesia    Respiratory issues with Succinylcholine   Coronary artery disease    3 stent   Difficult  intubation    Ejection fraction    Erectile dysfunction    Fecal incontinence    GERD (gastroesophageal reflux disease)    Heart murmur 3846   LVH, diastolic dysfunction, aortic sclerosis   Hemorrhoids    History of kidney stones    Hyperlipidemia    Hypertension    Insomnia     Past Surgical History:  Procedure Laterality Date   CATARACT EXTRACTION W/PHACO Right 09/24/2018   Procedure: CATARACT EXTRACTION PHACO AND INTRAOCULAR LENS PLACEMENT (IOC)COMPLICATED RIGHT;  Surgeon: Leandrew Koyanagi, MD;  Location: [place];  Service: Ophthalmology;  Laterality: Right;  MAYLUGIN   CATARACT EXTRACTION W/PHACO Left 10/15/2018   Procedure: CATARACT EXTRACTION PHACO AND INTRAOCULAR LENS PLACEMENT (IOC) RIGHT  01:16.3  17.9%  13.79;  Surgeon: Leandrew Koyanagi, MD;  Location: [place];  Service: Ophthalmology;  Laterality: Left;   LEFT HEART CATHETERIZATION WITH CORONARY ANGIOGRAM N/A 12/31/2013   Procedure: LEFT HEART CATHETERIZATION WITH CORONARY ANGIOGRAM;  Surgeon: Sinclair Grooms, MD;  Location: [facility] CATH LAB;  Service: Cardiovascular;  Laterality: N/A; Ostial and mid LAD stenting   PROSTATE BIOPSY  ~ [place] Right 01/16/2011   precancerous lesion from arm   TONSILLECTOMY     as a child   TRANSURETHRAL RESECTION OF PROSTATE N/A 07/09/2019   Procedure: TRANSURETHRAL RESECTION OF THE PROSTATE (TURP);  Surgeon: Franchot Gallo, MD;  Location: [facility];  Service: Urology;  Laterality: N/A;    MEDICATIONS:  acetaminophen (TYLENOL) 500 MG tablet   amLODipine (NORVASC) 10 MG tablet   aspirin EC 81 MG tablet   chlorthalidone (HYGROTON) 25 MG tablet   famotidine (PEPCID) 20 MG tablet   ferrous sulfate 325 (65 FE) MG tablet   hydrocortisone cream 1 %   omeprazole (PRILOSEC) 40 MG capsule   OVER THE COUNTER MEDICATION   OVER THE COUNTER MEDICATION   REPATHA SURECLICK 659 MG/ML SOAJ   tadalafil (CIALIS) 5 MG tablet   telmisartan (MICARDIS) 80  MG tablet   No current facility-administered medications for this encounter.     Konrad Felix Ward, PA-C [facility] [phone]

## 2021-07-13 NOTE — Telephone Encounter (Signed)
Pt has been scheduled to see Richardson Dopp, PA-C, 07/17/21, clearance will be addressed at that time.  Will route back to the requesting surgeon's office to make them aware.

## 2021-07-13 NOTE — Telephone Encounter (Signed)
   Name: ZYREE TRAYNHAM  DOB: [birth year]  MRN: [id number]  Primary Cardiologist: Sinclair Grooms, MD  Chart reviewed as part of pre-operative protocol coverage. Because of NORRIN SHREFFLER past medical history and time since last visit, he will require a follow-up in-office visit in order to better assess preoperative cardiovascular risk.  Pre-op covering staff: - Please schedule appointment and call patient to inform them. If patient already had an upcoming appointment within acceptable timeframe, please add "pre-op clearance" to the appointment notes so provider is aware. - Please contact requesting surgeon's office via preferred method (i.e, phone, fax) to inform them of need for appointment prior to surgery.  Would prefer ASA 81 mg to be continued however, if surgeon deems that it is too high of a bleeding risk can hold ASA 81 mg x 7 days per hour antiplatelet protocol.  Elgie Collard, PA-C  07/13/2021, 2:42 PM

## 2021-07-17 ENCOUNTER — Ambulatory Visit: Payer: PPO | Admitting: Physician Assistant

## 2021-07-19 ENCOUNTER — Telehealth: Payer: Self-pay | Admitting: *Deleted

## 2021-07-19 NOTE — Progress Notes (Unsigned)
Cardiology Office Note:    Date:  07/20/2021   ID:  Jason Blackwell, DOB [birth year], MRN [id number]  PCP:  Lavone Orn, MD  [facility] Cardiologist:  Sinclair Grooms, MD  [facility] Electrophysiologist:  None   Chief Complaint: Surgical clearance for right total knee arthroplasty  History of Present Illness:    Jason Blackwell is a 75 y.o. male with a hx of CAD with ostial to proximal LAD DES 3 in 2015, aortic stenosis, hypertension, hyperlipidemia (on Repatha), and diastolic left ventricular dysfunction presents for surgical clearance.  Echocardiogram 03/2019 showed normal LV function, mild LVH and grade 1 diastolic dysfunction.  Mild aortic stenosis with mean gradient of 15 mmHg.  Patient was doing well from cardiac standpoint when last seen by Dr. Tamala Julian June 2022.  Patient is here for follow-up.  He reports chronic right knee pain radiating to his left hip.  He walks around community for about 1 mile.  Progressive worsening limitation due to right knee pain.  Also reports chronic stable shortness of breath recently.  He thinks he is out of shape due to knee problem.  Does household chores without any problem.  No exertional chest pressure.  His symptoms is not similar to prior angina.  Denies orthopnea, PND, syncope, lower extremity edema, melena, palpitation or dizziness.   Past Medical History:  Diagnosis Date   Arthritis    right knee   BPH (benign prostatic hypertrophy)    TURP done   Cancer [facility])    skin   Colon adenoma    Complication of anesthesia    Respiratory issues with Succinylcholine   Coronary artery disease    3 stent   Difficult intubation    Ejection fraction    Erectile dysfunction    Fecal incontinence    GERD (gastroesophageal reflux disease)    Heart murmur 7672   LVH, diastolic dysfunction, aortic sclerosis   Hemorrhoids    History of kidney stones    Hyperlipidemia    Hypertension    Insomnia     Past Surgical History:   Procedure Laterality Date   CATARACT EXTRACTION W/PHACO Right 09/24/2018   Procedure: CATARACT EXTRACTION PHACO AND INTRAOCULAR LENS PLACEMENT (IOC)COMPLICATED RIGHT;  Surgeon: Leandrew Koyanagi, MD;  Location: [place];  Service: Ophthalmology;  Laterality: Right;  MAYLUGIN   CATARACT EXTRACTION W/PHACO Left 10/15/2018   Procedure: CATARACT EXTRACTION PHACO AND INTRAOCULAR LENS PLACEMENT (IOC) RIGHT  01:16.3  17.9%  13.79;  Surgeon: Leandrew Koyanagi, MD;  Location: [place];  Service: Ophthalmology;  Laterality: Left;   LEFT HEART CATHETERIZATION WITH CORONARY ANGIOGRAM N/A 12/31/2013   Procedure: LEFT HEART CATHETERIZATION WITH CORONARY ANGIOGRAM;  Surgeon: Sinclair Grooms, MD;  Location: [facility] CATH LAB;  Service: Cardiovascular;  Laterality: N/A; Ostial and mid LAD stenting   PROSTATE BIOPSY  ~ [place] Right 01/16/2011   precancerous lesion from arm   TONSILLECTOMY     as a child   TRANSURETHRAL RESECTION OF PROSTATE N/A 07/09/2019   Procedure: TRANSURETHRAL RESECTION OF THE PROSTATE (TURP);  Surgeon: Franchot Gallo, MD;  [facility];  Service: Urology;  Laterality: N/A;    Current Medications: Current Meds  Medication Sig   acetaminophen (TYLENOL) 500 MG tablet Take 500 mg by mouth at bedtime.   amLODipine (NORVASC) 10 MG tablet Take 10 mg by mouth daily.   aspirin EC 81 MG tablet Take 81 mg by mouth daily. Swallow whole.   chlorthalidone (  HYGROTON) 25 MG tablet TAKE 1 TABLET (25 MG TOTAL) BY MOUTH DAILY.   famotidine (PEPCID) 20 MG tablet Take 20 mg by mouth every evening.   ferrous sulfate 325 (65 FE) MG tablet Take 325 mg by mouth 2 (two) times daily with a meal.   hydrocortisone cream 1 % Apply 1 Application topically daily as needed for itching.   omeprazole (PRILOSEC) 40 MG capsule Take 40 mg by mouth in the morning and at bedtime.   OVER THE COUNTER MEDICATION Take 2 capsules by mouth daily. Balance of nature veggies    OVER THE COUNTER MEDICATION Take 2 capsules by mouth daily. Balance of nature fruits   REPATHA SURECLICK 703 MG/ML SOAJ INJECT 140 MG INTO THE SKIN EVERY 14 (FOURTEEN) DAYS.   tadalafil (CIALIS) 5 MG tablet Take 5 mg by mouth daily.   telmisartan (MICARDIS) 80 MG tablet Take 80 mg by mouth daily.     Allergies:   Rosuvastatin, Simvastatin, Succinylcholine chloride, Sulfa antibiotics, and Sulfamethoxazole   Social History   Socioeconomic History   Marital status: Married    Spouse name: Not on file   Number of children: Not on file   Years of education: Not on file   Highest education level: Not on file  Occupational History   Not on file  Tobacco Use   Smoking status: Never   Smokeless tobacco: Never  Vaping Use   Vaping Use: Never used  Substance and Sexual Activity   Alcohol use: Yes    Alcohol/week: 28.0 standard drinks of alcohol    Types: 28 Glasses of wine per week    Comment: occas.   Drug use: No   Sexual activity: Yes  Other Topics Concern   Not on file  Social History Narrative   Not on file   Social Determinants of Health   Financial Resource Strain: Not on file  Food Insecurity: Not on file  Transportation Needs: Not on file  Physical Activity: Not on file  Stress: Not on file  Social Connections: Not on file     Family History: The patient's family history includes Cancer - Colon in his maternal uncle; Diabetes in his sister; Heart attack in his father; Heart disease in his mother.    ROS:   Please see the history of present illness.    All other systems reviewed and are negative.   EKGs/Labs/Other Studies Reviewed:    The following studies were reviewed today:  Echo 03/2019  1. Normal LV systolic function; mild LVH; grade 1 diastolic dysfunction;  calcified aortic valve with reduced excursion of left coronary cusp; mild  AS (mean gradient 15 mmHg).   2. Left ventricular ejection fraction, by estimation, is 60 to 65%. The  left ventricle has  normal function. The left ventricle has no regional  wall motion abnormalities. There is mild left ventricular hypertrophy.  Left ventricular diastolic parameters  are consistent with Grade I diastolic dysfunction (impaired relaxation).   3. Right ventricular systolic function is normal. The right ventricular  size is normal.   4. The mitral valve is normal in structure. No evidence of mitral valve  regurgitation. No evidence of mitral stenosis.   5. The aortic valve is tricuspid. Aortic valve regurgitation is not  visualized. Mild aortic valve stenosis.   6. The inferior vena cava is normal in size with greater than 50%  respiratory variability, suggesting right atrial pressure of 3 mmHg.   Comparison(s): 06/05/16 EF 55-60%. Mild AS 24mHg mean, 244mg peak.  EKG:  EKG is  ordered today.  The ekg ordered today demonstrates normal sinus rhythm  Recent Labs: 07/12/2021: BUN 10; Creatinine, Ser 0.60; Hemoglobin 14.8; Platelets 328; Potassium 2.9; Sodium 136  Recent Lipid Panel    Component Value Date/Time   CHOL 149 05/21/2017 0930   TRIG 89 05/21/2017 0930   HDL 59 05/21/2017 0930   CHOLHDL 2.5 05/21/2017 0930   LDLCALC 72 05/21/2017 0930     Physical Exam:    VS:  BP 126/60   Pulse 69   Ht '5\' 10"'$  (1.778 m)   Wt 237 lb (107.5 kg)   SpO2 98%   BMI 34.01 kg/m     Wt Readings from Last 3 Encounters:  07/20/21 237 lb (107.5 kg)  07/12/21 236 lb (107 kg)  06/27/21 239 lb 12.8 oz (108.8 kg)     GEN:  Well nourished, well developed in no acute distress HEENT: Normal NECK: No JVD; No carotid bruits LYMPHATICS: No lymphadenopathy CARDIAC: RRR, no murmurs, rubs, gallops RESPIRATORY:  Clear to auscultation without rales, wheezing or rhonchi  ABDOMEN: Soft, non-tender, non-distended MUSCULOSKELETAL:  No edema; No deformity  SKIN: Warm and dry NEUROLOGIC:  Alert and oriented x 3 PSYCHIATRIC:  Normal affect   ASSESSMENT AND PLAN:    CAD No anginal symptoms.  Continue  aspirin and Repatha.   2.  Mild aortic stenosis We will update echocardiogram.  I think he is dyspnea is due to deconditioning.  This will not prohibit to have surgery done tomorrow.  3.  Surgical clearance He is easily getting greater than 4 METS of activity.  He is exertional shortness of breath is likely due to deconditioning.  He thinks he is limiting himself due to knee problem.  Given past medical history and time since last visit, based on ACC/AHA guidelines, Jason Blackwell would be at acceptable risk for the planned procedure without further cardiovascular testing.   The patient was advised that if he develops new symptoms prior to surgery to contact our office to arrange for a follow-up visit, and he verbalized understanding.  I will route this recommendation to the requesting party via Epic fax function and remove from pre-op pool.  Please call with questions.  4. HTN BP stable on current medications    Medication Adjustments/Labs and Tests Ordered: Current medicines are reviewed at length with the patient today.  Concerns regarding medicines are outlined above.  Orders Placed This Encounter  Procedures   ECHOCARDIOGRAM COMPLETE   No orders of the defined types were placed in this encounter.   Patient Instructions  Medication Instructions:  Your physician recommends that you continue on your current medications as directed. Please refer to the Current Medication list given to you today. *If you need a refill on your cardiac medications before your next appointment, please call your pharmacy*   Lab Work: None ordered   Testing/Procedures: Your physician has requested that you have an echocardiogram. Echocardiography is a painless test that uses sound waves to create images of your heart. It provides your doctor with information about the size and shape of your heart and how well your heart's chambers and valves are working. This procedure takes approximately one  hour. There are no restrictions for this procedure.   Follow-Up: At [facility], you and your health needs are our priority.  As part of our continuing mission to provide you with exceptional heart care, we have created designated Provider Care Teams.  These Care Teams include your primary Cardiologist (  physician) and Advanced Practice Providers (APPs -  Physician Assistants and Nurse Practitioners) who all work together to provide you with the care you need, when you need it.  We recommend signing up for the patient portal called "MyChart".  Sign up information is provided on this After Visit Summary.  MyChart is used to connect with patients for Virtual Visits (Telemedicine).  Patients are able to view lab/test results, encounter notes, upcoming appointments, etc.  Non-urgent messages can be sent to your provider as well.   To learn more about what you can do with MyChart, go to [email or web address].    Your next appointment:   6 month(s)  The format for your next appointment:   In Person  Provider:   Sinclair Grooms, MD     Other Instructions You are cleared for your procedure.  Important Information About Sugar         Jarrett Soho, [place]  07/20/2021 3:33 PM    [facility]

## 2021-07-19 NOTE — Care Plan (Signed)
(  Late entry for Friday, 07/14/21) [facility] RNCM call to patient to discuss his upcoming Right total knee arthroplasty with Dr. Ninfa Linden on 07/21/21. Patient is an Ortho bundle through [facility] for his upcoming elective surgery. He is agreeable to case management. He lives with his spouse, who will be assisting after surgery. He will need a RW; has elevated toilets in the home. Will order DME through hospital to be delivered to room prior to discharge. Anticipate HHPT will be needed after a short hospital stay. Referral made to [facility] after choice provided. Reviewed all post op care instruction as well as copy mailed to his home for review. Will continue to follow for needs.

## 2021-07-19 NOTE — Telephone Encounter (Signed)
Ortho bundle pre-op call completed. 

## 2021-07-20 ENCOUNTER — Encounter: Payer: Self-pay | Admitting: Physician Assistant

## 2021-07-20 ENCOUNTER — Encounter (HOSPITAL_COMMUNITY): Payer: Self-pay | Admitting: Orthopaedic Surgery

## 2021-07-20 ENCOUNTER — Ambulatory Visit (INDEPENDENT_AMBULATORY_CARE_PROVIDER_SITE_OTHER): Payer: PPO | Admitting: Physician Assistant

## 2021-07-20 VITALS — BP 126/60 | HR 69 | Ht 70.0 in | Wt 237.0 lb

## 2021-07-20 DIAGNOSIS — I350 Nonrheumatic aortic (valve) stenosis: Secondary | ICD-10-CM

## 2021-07-20 DIAGNOSIS — I2510 Atherosclerotic heart disease of native coronary artery without angina pectoris: Secondary | ICD-10-CM | POA: Diagnosis not present

## 2021-07-20 DIAGNOSIS — I10 Essential (primary) hypertension: Secondary | ICD-10-CM

## 2021-07-20 NOTE — Anesthesia Preprocedure Evaluation (Addendum)
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 (+) Teeth Intact, Caps, Dental Advisory Given   Pulmonary neg pulmonary ROS,    Pulmonary exam normal breath sounds clear to auscultation       Cardiovascular hypertension, Pt. on medications + CAD and + Cardiac Stents  + Valvular Problems/Murmurs AS  Rhythm:Regular Rate:Normal + Systolic murmurs Stent x 3 LAD 2015  Cardiology clearance noted  Echo 04/13/19 1. Normal LV systolic function; mild LVH; grade 1 diastolic dysfunction; calcified aortic valve with reduced excursion of left coronary cusp; mild AS (mean gradient 15 mmHg).  2. Left ventricular ejection fraction, by estimation, is 60 to 65%. The left ventricle has normal function. The left ventricle has no regional wall motion abnormalities. There is mild left ventricular hypertrophy. Left ventricular diastolic parameters are consistent with Grade I diastolic dysfunction (impaired relaxation).  3. Right ventricular systolic function is normal. The right ventricular size is normal.  4. The mitral valve is normal in structure. No evidence of mitral valve regurgitation. No evidence of mitral stenosis.  5. The aortic valve is tricuspid. Aortic valve regurgitation is not visualized. Mild aortic valve stenosis.  6. The inferior vena cava is normal in size with greater than 50% respiratory variability, suggesting right atrial pressure of 3 mmHg.     Neuro/Psych negative neurological ROS  negative psych ROS   GI/Hepatic Neg liver ROS, GERD  Medicated and Controlled,  Endo/Other  Hyperlipidemia Obesity  Renal/GU negative Renal ROS   Hx/o elevated PSA and BPH; S/P TURP    Musculoskeletal  (+) Arthritis , Osteoarthritis,  OA right knee   Abdominal (+) + obese,   Peds  Hematology negative hematology ROS (+)   Anesthesia Other Findings   Reproductive/Obstetrics ED                         Anesthesia Physical Anesthesia Plan  ASA: 3  Anesthesia Plan: Spinal   Post-op Pain Management: Minimal or no pain anticipated and Regional block*   Induction: Intravenous  PONV Risk Score and Plan: 2 and Treatment may vary due to age or medical condition, Propofol infusion and Ondansetron  Airway Management Planned: Natural Airway, Simple Face Mask and Nasal Cannula  Additional Equipment:   Intra-op Plan:   Post-operative Plan:   Informed Consent: I have reviewed the patients History and Physical, chart, labs and discussed the procedure including the risks, benefits and alternatives for the proposed anesthesia with the patient or authorized representative who has indicated his/her understanding and acceptance.     Dental advisory given  Plan Discussed with: CRNA and Anesthesiologist  Anesthesia Plan Comments: (See PAT note 07/12/2021)      Anesthesia Quick Evaluation

## 2021-07-20 NOTE — H&P (Signed)
TOTAL KNEE ADMISSION H&P  Patient is being admitted for right total knee arthroplasty.  Subjective:  Chief Complaint:right knee pain.  HPI: NYHEIM Blackwell, 76 y.o. male, has a history of pain and functional disability in the right knee due to arthritis and has failed non-surgical conservative treatments for greater than 12 weeks to includeNSAID's and/or analgesics, corticosteriod injections, viscosupplementation injections, flexibility and strengthening excercises, use of assistive devices, weight reduction as appropriate, and activity modification.  Onset of symptoms was gradual, starting 2 years ago with gradually worsening course since that time. The patient noted no past surgery on the right knee(s).  Patient currently rates pain in the right knee(s) at 10 out of 10 with activity. Patient has night pain, worsening of pain with activity and weight bearing, pain that interferes with activities of daily living, pain with passive range of motion, crepitus, and joint swelling.  Patient has evidence of periarticular osteophytes and joint space narrowing by imaging studies. There is no active infection.  Patient Active Problem List   Diagnosis Date Noted   Unilateral primary osteoarthritis, right knee 05/15/2021   Chronic cough 04/14/2021   Malignant melanoma of scalp ([place]) 11/11/2019   Enlarged prostate with urinary obstruction 07/09/2019   Lesion of external ear, left 08/23/2015   Hemorrhoids 10/11/2014   CAD (coronary artery disease), native coronary artery 01/17/2014   Erectile dysfunction 01/17/2014   Allergy to statin medication 12/30/2013   Elevated PSA 12/30/2013   Aortic stenosis 12/30/2013   Ejection fraction    Hyperlipidemia 07/29/2006   Essential hypertension 07/29/2006   BENIGN PROSTATIC HYPERTROPHY 07/29/2006   NEPHROLITHIASIS, HX OF 07/29/2006   Past Medical History:  Diagnosis Date   Arthritis    right knee   BPH (benign prostatic hypertrophy)    TURP done   Cancer  ([place])    skin   Colon adenoma    Complication of anesthesia    Respiratory issues with Succinylcholine   Coronary artery disease    3 stent   Difficult intubation    Ejection fraction    Erectile dysfunction    Fecal incontinence    GERD (gastroesophageal reflux disease)    Heart murmur 5284   LVH, diastolic dysfunction, aortic sclerosis   Hemorrhoids    History of kidney stones    Hyperlipidemia    Hypertension    Insomnia     Past Surgical History:  Procedure Laterality Date   CATARACT EXTRACTION W/PHACO Right 09/24/2018   Procedure: CATARACT EXTRACTION PHACO AND INTRAOCULAR LENS PLACEMENT (IOC)COMPLICATED RIGHT;  Surgeon: Leandrew Koyanagi, MD;  Location: [place];  Service: Ophthalmology;  Laterality: Right;  MAYLUGIN   CATARACT EXTRACTION W/PHACO Left 10/15/2018   Procedure: CATARACT EXTRACTION PHACO AND INTRAOCULAR LENS PLACEMENT (IOC) RIGHT  01:16.3  17.9%  13.79;  Surgeon: Leandrew Koyanagi, MD;  Location: [place];  Service: Ophthalmology;  Laterality: Left;   LEFT HEART CATHETERIZATION WITH CORONARY ANGIOGRAM N/A 12/31/2013   Procedure: LEFT HEART CATHETERIZATION WITH CORONARY ANGIOGRAM;  Surgeon: Sinclair Grooms, MD;  Location: [facility] CATH LAB;  Service: Cardiovascular;  Laterality: N/A; Ostial and mid LAD stenting   PROSTATE BIOPSY  ~ [place] Right 01/16/2011   precancerous lesion from arm   TONSILLECTOMY     as a child   TRANSURETHRAL RESECTION OF PROSTATE N/A 07/09/2019   Procedure: TRANSURETHRAL RESECTION OF THE PROSTATE (TURP);  Surgeon: Franchot Gallo, MD;  Location: [facility];  Service: Urology;  Laterality: N/A;    No  current facility-administered medications for this encounter.   Current Outpatient Medications  Medication Sig Dispense Refill Last Dose   acetaminophen (TYLENOL) 500 MG tablet Take 500 mg by mouth at bedtime.      amLODipine (NORVASC) 10 MG tablet Take 10 mg by mouth daily.      aspirin EC 81 MG  tablet Take 81 mg by mouth daily. Swallow whole.      chlorthalidone (HYGROTON) 25 MG tablet TAKE 1 TABLET (25 MG TOTAL) BY MOUTH DAILY. 90 tablet 0    famotidine (PEPCID) 20 MG tablet Take 20 mg by mouth every evening.      ferrous sulfate 325 (65 FE) MG tablet Take 325 mg by mouth 2 (two) times daily with a meal.      hydrocortisone cream 1 % Apply 1 Application topically daily as needed for itching.      omeprazole (PRILOSEC) 40 MG capsule Take 40 mg by mouth in the morning and at bedtime.      OVER THE COUNTER MEDICATION Take 2 capsules by mouth daily. Balance of nature veggies      OVER THE COUNTER MEDICATION Take 2 capsules by mouth daily. Balance of nature fruits      REPATHA SURECLICK 245 MG/ML SOAJ INJECT 140 MG INTO THE SKIN EVERY 14 (FOURTEEN) DAYS. 6 mL 3    tadalafil (CIALIS) 5 MG tablet Take 5 mg by mouth daily.      telmisartan (MICARDIS) 80 MG tablet Take 80 mg by mouth daily.      Allergies  Allergen Reactions   Simvastatin Other (See Comments)    Severe muscle fatigue  ALL STATINS   Succinylcholine Chloride Other (See Comments)    Respiratory problems   Sulfa Antibiotics Itching   Sulfamethoxazole Itching          Social History   Tobacco Use   Smoking status: Never   Smokeless tobacco: Never  Substance Use Topics   Alcohol use: Yes    Alcohol/week: 28.0 standard drinks of alcohol    Types: 28 Glasses of wine per week    Comment: occas.    Family History  Problem Relation Age of Onset   Heart disease Mother    Heart attack Father    Diabetes Sister    Cancer - Colon Maternal Uncle      Review of Systems  Musculoskeletal:  Positive for gait problem and joint swelling.  All other systems reviewed and are negative.   Objective:  Physical Exam Vitals reviewed.  Constitutional:      Appearance: Normal appearance.  HENT:     Head: Normocephalic and atraumatic.  Eyes:     Extraocular Movements: Extraocular movements intact.     Pupils: Pupils are  equal, round, and reactive to light.  Cardiovascular:     Rate and Rhythm: Normal rate.  Pulmonary:     Effort: Pulmonary effort is normal.     Breath sounds: Normal breath sounds.  Abdominal:     Palpations: Abdomen is soft.  Musculoskeletal:     Cervical back: Normal range of motion and neck supple.     Right knee: Effusion, bony tenderness and crepitus present. Decreased range of motion. Tenderness present over the medial joint line, lateral joint line and patellar tendon. Abnormal alignment and abnormal meniscus.  Neurological:     Mental Status: He is alert and oriented to person, place, and time.  Psychiatric:        Behavior: Behavior normal.     Vital  signs in last 24 hours:    Labs:   Estimated body mass index is 33.86 kg/m as calculated from the following:   Height as of 07/12/21: '5\' 10"'$  (1.778 m).   Weight as of 07/12/21: 107 kg.   Imaging Review Plain radiographs demonstrate severe degenerative joint disease of the right knee(s). The overall alignment ismild varus. The bone quality appears to be excellent for age and reported activity level.      Assessment/Plan:  End stage arthritis, right knee   The patient history, physical examination, clinical judgment of the provider and imaging studies are consistent with end stage degenerative joint disease of the right knee(s) and total knee arthroplasty is deemed medically necessary. The treatment options including medical management, injection therapy arthroscopy and arthroplasty were discussed at length. The risks and benefits of total knee arthroplasty were presented and reviewed. The risks due to aseptic loosening, infection, stiffness, patella tracking problems, thromboembolic complications and other imponderables were discussed. The patient acknowledged the explanation, agreed to proceed with the plan and consent was signed. Patient is being admitted for inpatient treatment for surgery, pain control, PT, OT,  prophylactic antibiotics, VTE prophylaxis, progressive ambulation and ADL's and discharge planning. The patient is planning to be discharged home with home health services

## 2021-07-20 NOTE — Patient Instructions (Signed)
Medication Instructions:  Your physician recommends that you continue on your current medications as directed. Please refer to the Current Medication list given to you today. *If you need a refill on your cardiac medications before your next appointment, please call your pharmacy*   Lab Work: None ordered   Testing/Procedures: Your physician has requested that you have an echocardiogram. Echocardiography is a painless test that uses sound waves to create images of your heart. It provides your doctor with information about the size and shape of your heart and how well your heart's chambers and valves are working. This procedure takes approximately one hour. There are no restrictions for this procedure.   Follow-Up: At [facility] [place], you and your health needs are our priority.  As part of our continuing mission to provide you with exceptional heart care, we have created designated Provider Care Teams.  These Care Teams include your primary Cardiologist (physician) and Advanced Practice Providers (APPs -  Physician Assistants and Nurse Practitioners) who all work together to provide you with the care you need, when you need it.  We recommend signing up for the patient portal called "MyChart".  Sign up information is provided on this After Visit Summary.  MyChart is used to connect with patients for Virtual Visits (Telemedicine).  Patients are able to view lab/test results, encounter notes, upcoming appointments, etc.  Non-urgent messages can be sent to your provider as well.   To learn more about what you can do with MyChart, go to [email or web address].    Your next appointment:   6 month(s)  The format for your next appointment:   In Person  Provider:   Sinclair Grooms, MD     Other Instructions You are cleared for your procedure.  Important Information About Sugar

## 2021-07-21 ENCOUNTER — Observation Stay (HOSPITAL_COMMUNITY)
Admission: RE | Admit: 2021-07-21 | Discharge: 2021-07-23 | Disposition: A | Discharge status: Home / Self Care | Payer: PPO | Attending: Orthopaedic Surgery | Admitting: Orthopaedic Surgery

## 2021-07-21 ENCOUNTER — Ambulatory Visit (HOSPITAL_COMMUNITY): Payer: PPO | Admitting: Physician Assistant

## 2021-07-21 ENCOUNTER — Ambulatory Visit (HOSPITAL_BASED_OUTPATIENT_CLINIC_OR_DEPARTMENT_OTHER): Payer: PPO | Admitting: Anesthesiology

## 2021-07-21 ENCOUNTER — Other Ambulatory Visit: Payer: Self-pay

## 2021-07-21 ENCOUNTER — Encounter (HOSPITAL_COMMUNITY): Payer: Self-pay | Admitting: Orthopaedic Surgery

## 2021-07-21 ENCOUNTER — Encounter (HOSPITAL_COMMUNITY)
Admission: RE | Disposition: A | Discharge status: Home / Self Care | Payer: Self-pay | Source: Home / Self Care | Attending: Orthopaedic Surgery

## 2021-07-21 ENCOUNTER — Observation Stay (HOSPITAL_COMMUNITY): Payer: PPO

## 2021-07-21 DIAGNOSIS — I2510 Atherosclerotic heart disease of native coronary artery without angina pectoris: Secondary | ICD-10-CM | POA: Diagnosis not present

## 2021-07-21 DIAGNOSIS — E785 Hyperlipidemia, unspecified: Secondary | ICD-10-CM

## 2021-07-21 DIAGNOSIS — Z85828 Personal history of other malignant neoplasm of skin: Secondary | ICD-10-CM | POA: Insufficient documentation

## 2021-07-21 DIAGNOSIS — Z7982 Long term (current) use of aspirin: Secondary | ICD-10-CM | POA: Diagnosis not present

## 2021-07-21 DIAGNOSIS — M1711 Unilateral primary osteoarthritis, right knee: Principal | ICD-10-CM | POA: Diagnosis present

## 2021-07-21 DIAGNOSIS — I10 Essential (primary) hypertension: Secondary | ICD-10-CM

## 2021-07-21 DIAGNOSIS — Z955 Presence of coronary angioplasty implant and graft: Secondary | ICD-10-CM | POA: Insufficient documentation

## 2021-07-21 DIAGNOSIS — Z96651 Presence of right artificial knee joint: Secondary | ICD-10-CM | POA: Diagnosis not present

## 2021-07-21 DIAGNOSIS — G8918 Other acute postprocedural pain: Secondary | ICD-10-CM | POA: Diagnosis not present

## 2021-07-21 DIAGNOSIS — I350 Nonrheumatic aortic (valve) stenosis: Secondary | ICD-10-CM | POA: Diagnosis not present

## 2021-07-21 DIAGNOSIS — Z79899 Other long term (current) drug therapy: Secondary | ICD-10-CM | POA: Diagnosis not present

## 2021-07-21 DIAGNOSIS — Z471 Aftercare following joint replacement surgery: Secondary | ICD-10-CM | POA: Diagnosis not present

## 2021-07-21 HISTORY — PX: TOTAL KNEE ARTHROPLASTY: SHX125

## 2021-07-21 LAB — TYPE AND SCREEN
ABO/RH(D): A POS
Antibody Screen: NEGATIVE

## 2021-07-21 LAB — ABO/RH: ABO/RH(D): A POS

## 2021-07-21 SURGERY — ARTHROPLASTY, KNEE, TOTAL
Anesthesia: Spinal | Site: Knee | Laterality: Right

## 2021-07-21 MED ORDER — OXYCODONE HCL 5 MG PO TABS
5.0000 mg | ORAL_TABLET | ORAL | Status: DC | PRN
  Administered 2021-07-21 – 2021-07-23 (×4): 10 mg via ORAL
  Filled 2021-07-21 (×4): qty 2

## 2021-07-21 MED ORDER — PHENYLEPHRINE 80 MCG/ML (10ML) SYRINGE FOR IV PUSH (FOR BLOOD PRESSURE SUPPORT)
PREFILLED_SYRINGE | INTRAVENOUS | Status: AC
  Filled 2021-07-21: qty 10

## 2021-07-21 MED ORDER — METHOCARBAMOL 500 MG PO TABS
500.0000 mg | ORAL_TABLET | Freq: Four times a day (QID) | ORAL | Status: DC | PRN
  Administered 2021-07-22: 500 mg via ORAL
  Filled 2021-07-21: qty 1

## 2021-07-21 MED ORDER — TRANEXAMIC ACID-NACL 1000-0.7 MG/100ML-% IV SOLN
1000.0000 mg | INTRAVENOUS | Status: AC
  Administered 2021-07-21: 1000 mg via INTRAVENOUS
  Filled 2021-07-21: qty 100

## 2021-07-21 MED ORDER — MIDAZOLAM HCL 2 MG/2ML IJ SOLN
1.0000 mg | INTRAMUSCULAR | Status: DC
  Filled 2021-07-21: qty 2

## 2021-07-21 MED ORDER — METHOCARBAMOL 500 MG IVPB - SIMPLE MED
INTRAVENOUS | Status: AC
  Filled 2021-07-21: qty 55

## 2021-07-21 MED ORDER — ACETAMINOPHEN 325 MG PO TABS
ORAL_TABLET | ORAL | Status: AC
  Filled 2021-07-21: qty 2

## 2021-07-21 MED ORDER — AMLODIPINE BESYLATE 10 MG PO TABS
10.0000 mg | ORAL_TABLET | Freq: Every day | ORAL | Status: DC
  Administered 2021-07-22 – 2021-07-23 (×2): 10 mg via ORAL
  Filled 2021-07-21 (×2): qty 1

## 2021-07-21 MED ORDER — BUPIVACAINE IN DEXTROSE 0.75-8.25 % IT SOLN
INTRATHECAL | Status: DC | PRN
  Administered 2021-07-21: 2 mL via INTRATHECAL

## 2021-07-21 MED ORDER — ONDANSETRON HCL 4 MG/2ML IJ SOLN
INTRAMUSCULAR | Status: AC
  Filled 2021-07-21: qty 2

## 2021-07-21 MED ORDER — LIDOCAINE HCL (CARDIAC) PF 100 MG/5ML IV SOSY
PREFILLED_SYRINGE | INTRAVENOUS | Status: DC | PRN
  Administered 2021-07-21: 40 mg via INTRAVENOUS

## 2021-07-21 MED ORDER — ACETAMINOPHEN 325 MG PO TABS: 325.0000 mg | ORAL_TABLET | Freq: Four times a day (QID) | ORAL | Status: DC | PRN

## 2021-07-21 MED ORDER — EPHEDRINE SULFATE-NACL 50-0.9 MG/10ML-% IV SOSY
PREFILLED_SYRINGE | INTRAVENOUS | Status: DC | PRN
  Administered 2021-07-21 (×2): 5 mg via INTRAVENOUS

## 2021-07-21 MED ORDER — HYDROMORPHONE HCL 1 MG/ML IJ SOLN
INTRAMUSCULAR | Status: AC
  Filled 2021-07-21: qty 1

## 2021-07-21 MED ORDER — HYDROMORPHONE HCL 2 MG PO TABS
2.0000 mg | ORAL_TABLET | ORAL | Status: DC | PRN
  Administered 2021-07-22 (×4): 2 mg via ORAL
  Filled 2021-07-21 (×4): qty 1

## 2021-07-21 MED ORDER — HYDROMORPHONE HCL 1 MG/ML IJ SOLN
0.2500 mg | INTRAMUSCULAR | Status: DC | PRN
  Administered 2021-07-21 (×2): 0.5 mg via INTRAVENOUS

## 2021-07-21 MED ORDER — PROPOFOL 500 MG/50ML IV EMUL
INTRAVENOUS | Status: AC
  Filled 2021-07-21: qty 50

## 2021-07-21 MED ORDER — PANTOPRAZOLE SODIUM 40 MG PO TBEC
40.0000 mg | DELAYED_RELEASE_TABLET | Freq: Every day | ORAL | Status: DC
  Administered 2021-07-22 – 2021-07-23 (×2): 40 mg via ORAL
  Filled 2021-07-21 (×2): qty 1

## 2021-07-21 MED ORDER — SODIUM CHLORIDE 0.9 % IR SOLN
Status: DC | PRN
  Administered 2021-07-21: 1000 mL

## 2021-07-21 MED ORDER — ASPIRIN 81 MG PO CHEW
81.0000 mg | CHEWABLE_TABLET | Freq: Two times a day (BID) | ORAL | Status: DC
  Administered 2021-07-21 – 2021-07-23 (×4): 81 mg via ORAL
  Filled 2021-07-21 (×5): qty 1

## 2021-07-21 MED ORDER — DOCUSATE SODIUM 100 MG PO CAPS
100.0000 mg | ORAL_CAPSULE | Freq: Two times a day (BID) | ORAL | Status: DC
  Administered 2021-07-21 – 2021-07-23 (×4): 100 mg via ORAL
  Filled 2021-07-21 (×4): qty 1

## 2021-07-21 MED ORDER — LIDOCAINE HCL (PF) 2 % IJ SOLN
INTRAMUSCULAR | Status: AC
Start: 2021-07-21 — End: ?
  Filled 2021-07-21: qty 5

## 2021-07-21 MED ORDER — OXYCODONE HCL 5 MG PO TABS
ORAL_TABLET | ORAL | Status: AC
  Filled 2021-07-21: qty 1

## 2021-07-21 MED ORDER — PROPOFOL 10 MG/ML IV BOLUS
INTRAVENOUS | Status: DC | PRN
  Administered 2021-07-21 (×5): 20 mg via INTRAVENOUS

## 2021-07-21 MED ORDER — CEFAZOLIN SODIUM-DEXTROSE 1-4 GM/50ML-% IV SOLN
1.0000 g | Freq: Four times a day (QID) | INTRAVENOUS | Status: AC
  Administered 2021-07-21 (×2): 1 g via INTRAVENOUS
  Filled 2021-07-21 (×2): qty 50

## 2021-07-21 MED ORDER — HYDROMORPHONE HCL 1 MG/ML IJ SOLN
0.5000 mg | INTRAMUSCULAR | Status: DC | PRN
  Administered 2021-07-21: 1 mg via INTRAVENOUS
  Filled 2021-07-21: qty 1

## 2021-07-21 MED ORDER — DIPHENHYDRAMINE HCL 12.5 MG/5ML PO ELIX: 12.5000 mg | ORAL_SOLUTION | ORAL | Status: DC | PRN

## 2021-07-21 MED ORDER — MENTHOL 3 MG MT LOZG: 1.0000 | LOZENGE | OROMUCOSAL | Status: DC | PRN

## 2021-07-21 MED ORDER — SODIUM CHLORIDE 0.9 % IV SOLN: INTRAVENOUS | Status: DC

## 2021-07-21 MED ORDER — CHLORHEXIDINE GLUCONATE 0.12 % MT SOLN
15.0000 mL | Freq: Once | OROMUCOSAL | Status: AC
  Administered 2021-07-21: 15 mL via OROMUCOSAL

## 2021-07-21 MED ORDER — HYDROMORPHONE HCL 1 MG/ML IJ SOLN
0.2500 mg | INTRAMUSCULAR | Status: DC | PRN
  Administered 2021-07-21 (×4): 0.5 mg via INTRAVENOUS

## 2021-07-21 MED ORDER — PROPOFOL 500 MG/50ML IV EMUL
INTRAVENOUS | Status: DC | PRN
  Administered 2021-07-21: 75 ug/kg/min via INTRAVENOUS

## 2021-07-21 MED ORDER — EPHEDRINE 5 MG/ML INJ
INTRAVENOUS | Status: AC
  Filled 2021-07-21: qty 5

## 2021-07-21 MED ORDER — OXYCODONE HCL 5 MG/5ML PO SOLN: 5.0000 mg | Freq: Once | ORAL | Status: AC | PRN

## 2021-07-21 MED ORDER — ORAL CARE MOUTH RINSE: 15.0000 mL | Freq: Once | OROMUCOSAL | Status: AC

## 2021-07-21 MED ORDER — POVIDONE-IODINE 10 % EX SWAB
2.0000 | Freq: Once | CUTANEOUS | Status: AC
  Administered 2021-07-21: 2 via TOPICAL

## 2021-07-21 MED ORDER — FERROUS SULFATE 325 (65 FE) MG PO TABS
325.0000 mg | ORAL_TABLET | Freq: Two times a day (BID) | ORAL | Status: DC
  Administered 2021-07-22 – 2021-07-23 (×2): 325 mg via ORAL
  Filled 2021-07-21 (×2): qty 1

## 2021-07-21 MED ORDER — ONDANSETRON HCL 4 MG/2ML IJ SOLN
4.0000 mg | Freq: Four times a day (QID) | INTRAMUSCULAR | Status: DC | PRN
  Administered 2021-07-22: 4 mg via INTRAVENOUS
  Filled 2021-07-21: qty 2

## 2021-07-21 MED ORDER — ROPIVACAINE HCL 5 MG/ML IJ SOLN
INTRAMUSCULAR | Status: DC | PRN
  Administered 2021-07-21: 30 mL via PERINEURAL

## 2021-07-21 MED ORDER — METOCLOPRAMIDE HCL 5 MG PO TABS: 5.0000 mg | ORAL_TABLET | Freq: Three times a day (TID) | ORAL | Status: DC | PRN

## 2021-07-21 MED ORDER — ONDANSETRON HCL 4 MG/2ML IJ SOLN
4.0000 mg | Freq: Once | INTRAMUSCULAR | Status: DC | PRN
Start: 2021-07-21 — End: 2021-07-21

## 2021-07-21 MED ORDER — HYDROMORPHONE HCL 1 MG/ML IJ SOLN: 0.2500 mg | INTRAMUSCULAR | Status: DC | PRN

## 2021-07-21 MED ORDER — ALUM & MAG HYDROXIDE-SIMETH 200-200-20 MG/5ML PO SUSP: 30.0000 mL | ORAL | Status: DC | PRN

## 2021-07-21 MED ORDER — OXYCODONE HCL 5 MG PO TABS
5.0000 mg | ORAL_TABLET | Freq: Once | ORAL | Status: AC | PRN
  Administered 2021-07-21: 5 mg via ORAL

## 2021-07-21 MED ORDER — CEFAZOLIN SODIUM-DEXTROSE 2-4 GM/100ML-% IV SOLN
2.0000 g | INTRAVENOUS | Status: AC
  Administered 2021-07-21: 2 g via INTRAVENOUS
  Filled 2021-07-21: qty 100

## 2021-07-21 MED ORDER — METOCLOPRAMIDE HCL 5 MG/ML IJ SOLN: 5.0000 mg | Freq: Three times a day (TID) | INTRAMUSCULAR | Status: DC | PRN

## 2021-07-21 MED ORDER — ONDANSETRON HCL 4 MG/2ML IJ SOLN
INTRAMUSCULAR | Status: DC | PRN
  Administered 2021-07-21: 4 mg via INTRAVENOUS

## 2021-07-21 MED ORDER — CHLORTHALIDONE 25 MG PO TABS
25.0000 mg | ORAL_TABLET | Freq: Every day | ORAL | Status: DC
  Administered 2021-07-22 – 2021-07-23 (×2): 25 mg via ORAL
  Filled 2021-07-21 (×2): qty 1

## 2021-07-21 MED ORDER — 0.9 % SODIUM CHLORIDE (POUR BTL) OPTIME
TOPICAL | Status: DC | PRN
  Administered 2021-07-21: 1000 mL

## 2021-07-21 MED ORDER — FENTANYL CITRATE PF 50 MCG/ML IJ SOSY
50.0000 ug | PREFILLED_SYRINGE | INTRAMUSCULAR | Status: DC
  Administered 2021-07-21: 50 ug via INTRAVENOUS
  Filled 2021-07-21: qty 2

## 2021-07-21 MED ORDER — ONDANSETRON HCL 4 MG PO TABS
4.0000 mg | ORAL_TABLET | Freq: Four times a day (QID) | ORAL | Status: DC | PRN
  Administered 2021-07-23: 4 mg via ORAL
  Filled 2021-07-21: qty 1

## 2021-07-21 MED ORDER — FAMOTIDINE 20 MG PO TABS
20.0000 mg | ORAL_TABLET | Freq: Every evening | ORAL | Status: DC
  Administered 2021-07-21 – 2021-07-22 (×2): 20 mg via ORAL
  Filled 2021-07-21: qty 1

## 2021-07-21 MED ORDER — IRBESARTAN 150 MG PO TABS
300.0000 mg | ORAL_TABLET | Freq: Every day | ORAL | Status: DC
  Administered 2021-07-22 – 2021-07-23 (×2): 300 mg via ORAL
  Filled 2021-07-21 (×2): qty 2

## 2021-07-21 MED ORDER — PROPOFOL 1000 MG/100ML IV EMUL
INTRAVENOUS | Status: AC
Start: 2021-07-21 — End: ?
  Filled 2021-07-21: qty 100

## 2021-07-21 MED ORDER — PHENYLEPHRINE HCL-NACL 20-0.9 MG/250ML-% IV SOLN
INTRAVENOUS | Status: AC
  Filled 2021-07-21: qty 250

## 2021-07-21 MED ORDER — METHOCARBAMOL 500 MG IVPB - SIMPLE MED
500.0000 mg | Freq: Four times a day (QID) | INTRAVENOUS | Status: DC | PRN
  Administered 2021-07-21: 500 mg via INTRAVENOUS

## 2021-07-21 MED ORDER — PHENOL 1.4 % MT LIQD: 1.0000 | OROMUCOSAL | Status: DC | PRN

## 2021-07-21 MED ORDER — LACTATED RINGERS IV SOLN: INTRAVENOUS | Status: DC

## 2021-07-21 SURGICAL SUPPLY — 64 items
APL SKNCLS STERI-STRIP NONHPOA (GAUZE/BANDAGES/DRESSINGS)
BAG COUNTER SPONGE SURGICOUNT (BAG) ×1 IMPLANT
BAG SPEC THK2 15X12 ZIP CLS (MISCELLANEOUS) ×1
BAG SPNG CNTER NS LX DISP (BAG) ×1
BAG ZIPLOCK 12X15 (MISCELLANEOUS) ×2 IMPLANT
BENZOIN TINCTURE PRP APPL 2/3 (GAUZE/BANDAGES/DRESSINGS) IMPLANT
BLADE SAG 18X100X1.27 (BLADE) ×2 IMPLANT
BLADE SURG SZ10 CARB STEEL (BLADE) ×4 IMPLANT
BNDG ELASTIC 6X5.8 VLCR STR LF (GAUZE/BANDAGES/DRESSINGS) ×3 IMPLANT
BOWL SMART MIX CTS (DISPOSABLE) ×1 IMPLANT
BSPLAT TIB 5D F CMNT STM RT (Knees) ×1 IMPLANT
CEMENT BONE R 1X40 (Cement) ×2 IMPLANT
CLSR STERI-STRIP ANTIMIC 1/2X4 (GAUZE/BANDAGES/DRESSINGS) ×1 IMPLANT
COMP PATELLAR 35 STD 9 THK (Orthopedic Implant) IMPLANT
COOLER ICEMAN CLASSIC (MISCELLANEOUS) ×2 IMPLANT
COVER SURGICAL LIGHT HANDLE (MISCELLANEOUS) ×2 IMPLANT
CUFF TOURN SGL QUICK 34 (TOURNIQUET CUFF) ×2
CUFF TRNQT CYL 34X4.125X (TOURNIQUET CUFF) ×1 IMPLANT
DRAPE INCISE IOBAN 66X45 STRL (DRAPES) ×2 IMPLANT
DRAPE U-SHAPE 47X51 STRL (DRAPES) ×2 IMPLANT
DRSG PAD ABDOMINAL 8X10 ST (GAUZE/BANDAGES/DRESSINGS) ×3 IMPLANT
DURAPREP 26ML APPLICATOR (WOUND CARE) ×2 IMPLANT
ELECT BLADE TIP CTD 4 INCH (ELECTRODE) ×2 IMPLANT
ELECT REM PT RETURN 15FT ADLT (MISCELLANEOUS) ×2 IMPLANT
FEMUR CMT CR STD SZ 8 RT KNEE (Joint) ×2 IMPLANT
FEMUR CMTD CR STD SZ 8 RT KNEE (Joint) IMPLANT
GAUZE SPONGE 4X4 12PLY STRL (GAUZE/BANDAGES/DRESSINGS) ×2 IMPLANT
GAUZE XEROFORM 1X8 LF (GAUZE/BANDAGES/DRESSINGS) ×1 IMPLANT
GLOVE BIO SURGEON STRL SZ7.5 (GLOVE) ×2 IMPLANT
GLOVE BIOGEL PI IND STRL 8 (GLOVE) ×2 IMPLANT
GLOVE BIOGEL PI INDICATOR 8 (GLOVE) ×2
GLOVE ECLIPSE 8.0 STRL XLNG CF (GLOVE) ×2 IMPLANT
GOWN STRL REUS W/ TWL XL LVL3 (GOWN DISPOSABLE) ×2 IMPLANT
GOWN STRL REUS W/TWL XL LVL3 (GOWN DISPOSABLE) ×4
HANDPIECE INTERPULSE COAX TIP (DISPOSABLE) ×2
HDLS TROCR DRIL PIN KNEE 75 (PIN) ×2
HOLDER FOLEY CATH W/STRAP (MISCELLANEOUS) IMPLANT
IMMOBILIZER KNEE 20 (SOFTGOODS) ×2
IMMOBILIZER KNEE 20 THIGH 36 (SOFTGOODS) ×1 IMPLANT
INSERT TIBIA KNEE RIGHT 10 (Joint) ×1 IMPLANT
KIT TURNOVER KIT A (KITS) ×1 IMPLANT
NS IRRIG 1000ML POUR BTL (IV SOLUTION) ×2 IMPLANT
PACK TOTAL KNEE CUSTOM (KITS) ×2 IMPLANT
PAD COLD SHLDR WRAP-ON (PAD) ×2 IMPLANT
PADDING CAST COTTON 6X4 STRL (CAST SUPPLIES) ×3 IMPLANT
PATELLA ZIMMER 35MM (Orthopedic Implant) ×2 IMPLANT
PIN DRILL HDLS TROCAR 75 4PK (PIN) IMPLANT
PROTECTOR NERVE ULNAR (MISCELLANEOUS) ×2 IMPLANT
SCREW FEMALE HEX FIX 25X2.5 (ORTHOPEDIC DISPOSABLE SUPPLIES) ×2 IMPLANT
SET HNDPC FAN SPRY TIP SCT (DISPOSABLE) ×1 IMPLANT
SET PAD KNEE POSITIONER (MISCELLANEOUS) ×2 IMPLANT
SPIKE FLUID TRANSFER (MISCELLANEOUS) IMPLANT
STAPLER VISISTAT 35W (STAPLE) IMPLANT
STEM TIBIA 5 DEG SZ F R KNEE (Knees) IMPLANT
STRIP CLOSURE SKIN 1/2X4 (GAUZE/BANDAGES/DRESSINGS) ×1 IMPLANT
SUT MNCRL AB 4-0 PS2 18 (SUTURE) IMPLANT
SUT VIC AB 0 CT1 27 (SUTURE) ×2
SUT VIC AB 0 CT1 27XBRD ANTBC (SUTURE) ×1 IMPLANT
SUT VIC AB 1 CT1 36 (SUTURE) ×4 IMPLANT
SUT VIC AB 2-0 CT1 27 (SUTURE) ×4
SUT VIC AB 2-0 CT1 TAPERPNT 27 (SUTURE) ×2 IMPLANT
TIBIA STEM 5 DEG SZ F R KNEE (Knees) ×2 IMPLANT
TRAY FOLEY MTR SLVR 16FR STAT (SET/KITS/TRAYS/PACK) IMPLANT
WATER STERILE IRR 1000ML POUR (IV SOLUTION) ×4 IMPLANT

## 2021-07-21 NOTE — Anesthesia Postprocedure Evaluation (Signed)
Anesthesia Post Note  Patient: Jason Blackwell  Procedure(s) Performed: RIGHT TOTAL KNEE ARTHROPLASTY (Right: Knee)     Patient location during evaluation: PACU Anesthesia Type: Spinal Level of consciousness: oriented and awake and alert Pain management: pain level controlled Vital Signs Assessment: post-procedure vital signs reviewed and stable Respiratory status: spontaneous breathing, respiratory function stable, patient connected to nasal cannula oxygen and nonlabored ventilation Cardiovascular status: blood pressure returned to baseline and stable Postop Assessment: no headache, no backache, no apparent nausea or vomiting, spinal receding and patient able to bend at knees Anesthetic complications: no   No notable events documented.  Last Vitals:  Vitals:   07/21/21 1045 07/21/21 1100  BP:  115/73  Pulse: (!) 52 (!) 48  Resp: 15 15  Temp:    SpO2: 100% 100%    Last Pain:  Vitals:   07/21/21 1100  TempSrc:   PainSc: 8                  Clevie Prout A.

## 2021-07-21 NOTE — Anesthesia Procedure Notes (Signed)
Spinal  Patient location during procedure: OR Start time: 07/21/2021 8:27 AM End time: 07/21/2021 8:30 AM Staffing Performed: resident/CRNA  Anesthesiologist: Josephine Igo, MD Resident/CRNA: Raenette Rover, CRNA Performed by: Raenette Rover, CRNA Authorized by: Josephine Igo, MD   Preanesthetic Checklist Completed: patient identified, IV checked, site marked, risks and benefits discussed, surgical consent, monitors and equipment checked, pre-op evaluation and timeout performed Spinal Block Patient position: sitting Prep: DuraPrep Patient monitoring: blood pressure, continuous pulse ox and heart rate Approach: midline Location: L3-4 Injection technique: single-shot Needle Needle type: Pencan  Needle gauge: 24 G Assessment Sensory level: T6 Events: CSF return

## 2021-07-21 NOTE — Care Plan (Signed)
Ortho Bundle Case Management Note  Patient Details  Name: Jason Blackwell MRN: [id number] Date of Birth: September 02, 1945  [facility] call to patient to discuss his upcoming Right total knee arthroplasty with Dr. Ninfa Linden on 07/21/21. Patient is an Ortho bundle through [facility] for his upcoming elective surgery. He is agreeable to case management. He lives with his spouse, who will be assisting after surgery. He will need a RW; has elevated toilets in the home. Will order DME through hospital to be delivered to room prior to discharge. Anticipate HHPT will be needed after a short hospital stay. Referral made to [facility] after choice provided. Reviewed all post op care instruction as well as copy mailed to his home for review. Will continue to follow for needs.                  DME Arranged:  Walker rolling (Patient has elevated toilets in home he reports) DME Agency:     HH Arranged:  PT [place]:  [place]  Additional Comments: Please contact me with any questions of if this plan should need to change.  Jamse Arn, RN, BSN, [employer]  [phone] 07/21/2021, 12:54 PM

## 2021-07-21 NOTE — Progress Notes (Signed)
Physical Therapy Treatment Patient Details Name: Jason Blackwell MRN: [id number] DOB: 05/23/1945 Today's Date: 07/21/2021   History of Present Illness Pt is a 76yo male presenting s/p R-TKA on 07/21/21. PMH BPH, CAD s/p cath, GERD, HLD, HTN.    PT Comments    Pt reporting continued dizziness, RN requested PT assistance to transfer from recliner to bed. Pt required min assist for sit to stand transfer, min guard for step pivot transfer, and min assist to return BLE into bed. Pt educated on use of KI on R when OOB/mobilizing secondary to mild R knee buckling during transfer, verbalized understanding. RN present in room upon exit. We will continue to work with the pt acutely.     Recommendations for follow up therapy are one component of a multi-disciplinary discharge planning process, led by the attending physician.  Recommendations may be updated based on patient status, additional functional criteria and insurance authorization.  Follow Up Recommendations  Follow physician's recommendations for discharge plan and follow up therapies     Assistance Recommended at Discharge Set up Supervision/Assistance  Patient can return home with the following A little help with walking and/or transfers;A little help with bathing/dressing/bathroom;Assistance with cooking/housework   Equipment Recommendations  Rolling walker (2 wheels)    Recommendations for Other Services       Precautions / Restrictions Precautions Precautions: Fall Required Braces or Orthoses: Knee Immobilizer - Left Knee Immobilizer - Left: On when out of bed or walking Restrictions Weight Bearing Restrictions: No RLE Weight Bearing: Weight bearing as tolerated     Mobility  Bed Mobility Overal bed mobility: Needs Assistance Bed Mobility: Sit to Supine     Supine to sit: Min assist Sit to supine: Min assist   General bed mobility comments: min assist for BLE into bed    Transfers Overall transfer level: Needs  assistance Equipment used: Rolling walker (2 wheels) Transfers: Sit to/from Stand, Bed to chair/wheelchair/BSC Sit to Stand: Min assist   Step pivot transfers: Min guard       General transfer comment: Min assist for lift assist, multimodal cues for powering up. Step pivot: VCs for sequencing, provided min guard for safety, no physical assist required.    Ambulation/Gait               General Gait Details: deferred   Stairs             Wheelchair Mobility    Modified Rankin (Stroke Patients Only)       Balance Overall balance assessment: Needs assistance Sitting-balance support: Feet supported, No upper extremity supported Sitting balance-Leahy Scale: Good     Standing balance support: Reliant on assistive device for balance, During functional activity, Bilateral upper extremity supported Standing balance-Leahy Scale: Poor                              Cognition Arousal/Alertness: Awake/alert Behavior During Therapy: WFL for tasks assessed/performed Overall Cognitive Status: Within Functional Limits for tasks assessed                                          Exercises Total Joint Exercises Ankle Circles/Pumps: AROM, Both, 10 reps    General Comments General comments (skin integrity, edema, etc.): Wife Malachy Mood present      Pertinent Vitals/Pain Pain Assessment Pain Assessment: 0-10 Pain Score: 5  Pain Location: L knee Pain Descriptors / Indicators: Burning, Discomfort, Operative site guarding Pain Intervention(s): Limited activity within patient's tolerance, Monitored during session, Repositioned, Ice applied    Home Living Family/patient expects to be discharged to:: Private residence Living Arrangements: Spouse/significant other Available Help at Discharge: Family;Available 24 hours/day Type of Home: Apartment Home Access: Level entry       Home Layout: One level Home Equipment: None      Prior Function             PT Goals (current goals can now be found in the care plan section) Acute Rehab PT Goals Patient Stated Goal: Get back to walking PT Goal Formulation: With patient Time For Goal Achievement: 07/28/21 Potential to Achieve Goals: Good Progress towards PT goals: Progressing toward goals    Frequency    7X/week      PT Plan Current plan remains appropriate    Co-evaluation              AM-PAC PT "6 Clicks" Mobility   Outcome Measure  Help needed turning from your back to your side while in a flat bed without using bedrails?: None Help needed moving from lying on your back to sitting on the side of a flat bed without using bedrails?: A Little Help needed moving to and from a bed to a chair (including a wheelchair)?: A Little Help needed standing up from a chair using your arms (e.g., wheelchair or bedside chair)?: A Little Help needed to walk in hospital room?: A Little Help needed climbing 3-5 steps with a railing? : A Little 6 Click Score: 19    End of Session Equipment Utilized During Treatment: Gait belt Activity Tolerance: No increased pain;Patient tolerated treatment well Patient left: in bed;with bed alarm set;with nursing/sitter in room;with family/visitor present;with SCD's reapplied Nurse Communication: Mobility status PT Visit Diagnosis: Difficulty in walking, not elsewhere classified (R26.2);Pain Pain - Right/Left: Right Pain - part of body: Knee     Time: 1700-1710 PT Time Calculation (min) (ACUTE ONLY): 10 min  Charges:  $Therapeutic Activity: 8-22 mins                     Coolidge Breeze, PT, DPT [place] [facility] Department Office: [phone] Pager: [phone]   Coolidge Breeze 07/21/2021, 6:11 PM

## 2021-07-21 NOTE — Op Note (Signed)
NAME: Jason Blackwell, Jason Blackwell MEDICAL RECORD NO: [id number] ACCOUNT NO: [id number] DATE OF BIRTH: 03/24/1945 FACILITY: [facility] LOCATION: [facility] PHYSICIAN: Lind Guest. Ninfa Linden, MD  Operative Report   DATE OF PROCEDURE: 07/21/2021  PREOPERATIVE DIAGNOSIS:  Primary osteoarthritis and degenerative joint disease, right knee.  POSTOPERATIVE DIAGNOSIS:  Primary osteoarthritis and degenerative joint disease, right knee.  PROCEDURE:  Cemented right total knee arthroplasty.  IMPLANTS:  Biomet/Zimmer Persona cemented knee system with size 8 right standard CR femur, size F right tibial tray, 10 mm thickness right medial congruent polyethylene insert, size 35 patellar button.  SURGEON:  Lind Guest. Ninfa Linden, MD  ASSISTANT:  Benita Stabile, PA-C  ANESTHESIA:   1.  Right lower extremity adductor canal block. 2.  Spinal.  TOURNIQUET TIME:  Under 1 hour.  ANTIBIOTICS:  2 g IV Ancef.  BLOOD LOSS:  Less than 100 mL.  COMPLICATIONS:  None.  INDICATIONS:  The patient is a 76 year old gentleman with debilitating arthritis involving his right knee that is well documented.  He has tried and failed all forms of conservative treatment for over 12 months.  At this point, with the failure of  conservative treatment and his daily pain with his right knee that is detrimentally affecting his mobility, his quality of life and his activities of daily living, he does wish to proceed with a total knee arthroplasty.  We talked about the risk of acute  blood loss anemia, nerve or vessel injury, fracture, infection, DVT, implant failure and skin and soft tissue issues.  We talked about our goals being decreased pain, improve mobility and overall improve quality of life.  DESCRIPTION OF PROCEDURE:  After informed consent was obtained, appropriate right knee was marked, he was brought to the operating room after an adductor canal block was obtained in the right lower extremity in the holding room.  In the operating room,   he was sat up on the operating table where spinal anesthesia was obtained.  He was laid in supine position on the operating table.  We then attempted to pass a Foley catheter twice, but it was unsuccessful and he has had prostate issues, so we decided to  go without a Foley catheter.  A nonsterile tourniquet was placed around his upper right thigh and his right thigh, knee, leg, ankle and foot were prepped and draped with DuraPrep and sterile drapes including a sterile stockinette.  A timeout was called  and he was identified as correct patient, correct right knee.  We then used Esmarch to wrap that leg and tourniquet was inflated to 300 mm of pressure.  We made a direct midline incision over the patella and carried this proximally and distally.  We  dissected down the knee joint, carried out a medial parapatellar arthrotomy, finding a mild joint effusion.  With the knee in a flexed position, we removed osteophytes from all 3 compartments as well as remnants of the ACL, medial and lateral meniscus.   We then used extramedullary cutting guide for making our proximal tibia cut, taking 2 mm off the low side and 10 mm off the high side, correcting for varus and valgus and 3-degree slope.  We made this cut without difficulty.  We then went to the femur  and used an intramedullary cutting guide for the femur for a right knee at 5 degrees externally rotated and a 10 mm distal femoral cut.  We made that cut without difficulty, and brought the knee back down to full extension.  We then removed more debris  from the back of the knee including parts of the meniscus and then placed a 10 mm extension block and we achieved full extension.  We then went back to the femur and put our femoral sizing guide based off the epicondylar axis.  Based off of this, we  chose a size 8 femur.  We put a 4-in-1 cutting block for an 8 femur, made our anterior and posterior cuts, followed by our chamfer cuts.  We then went back to the  tibia and chose a size F right tibial tray for coverage on the tibial plateau, setting the  rotation off the tibial tubercle and the femur.  We did our keel punch and drill hole off of this.  We then trialed the size F right tibia followed by the size 8 right femur.  We placed a 10 mm medial congruent polyethylene insert.  We were pleased with  range of motion and stability without insert.  We then made our patellar cut and drilled three holes for size 35 patellar button.  With all trial instrumentation in the knee, again put him through several cycles of motion.  We were pleased with range of  motion and stability.  We then removed all trial instrumentation from the knee and irrigated the knee with normal saline solution.  We then dried the knee real well and the knee in a flexed position, we mixed our cement and then cemented our Biomet  Zimmer right F tibial tray followed by cementing our size right F standard CR femur.  We placed our medial congruent, right 10 mm thickness polyethylene insert and cemented our size 35 patellar button.  I then held the knee fully extended and compressed  while the cement hardened.  Once it hardened, we let the tourniquet down.  Hemostasis was obtained with electrocautery.  We then closed the arthrotomy with interrupted #1 Vicryl suture followed by 0 Vicryl to close the deep tissue and 2-0 Vicryl to close  the subcutaneous tissue.  The skin was closed with staples.  A well-padded sterile dressing was applied.  He was taken to recovery room in stable condition with all final counts being correct.  No complications noted.  Of note, Benita Stabile, PA-C did assist in the entire case from beginning to end and his assistance was medically necessary and crucial for helping retract soft tissues, guide implant placement and he was directly involved with a layered closure of the  wound.   MUK D: 07/21/2021 9:47:54 am T: 07/21/2021 10:19:00 am  JOB: [id number]/ [id number]

## 2021-07-21 NOTE — Interval H&P Note (Signed)
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  There has been no acute or interval change in his medical status.  See H&P.  The risks and benefits of surgery been discussed in detail and informed consent was obtained.  The right operative knee has been marked.  07/21/2021 7:00 AM  Jason Blackwell  has presented today for surgery, with the diagnosis of OSTEOARTHRITIS / [place].  The various methods of treatment have been discussed with the patient and family. After consideration of risks, benefits and other options for treatment, the patient has consented to  Procedure(s): RIGHT TOTAL KNEE ARTHROPLASTY (Right) as a surgical intervention.  The patient's history has been reviewed, patient examined, no change in status, stable for surgery.  I have reviewed the patient's chart and labs.  Questions were answered to the patient's satisfaction.     Mcarthur Rossetti

## 2021-07-21 NOTE — Plan of Care (Signed)

## 2021-07-21 NOTE — Anesthesia Procedure Notes (Signed)
Anesthesia Regional Block: Adductor canal block   Pre-Anesthetic Checklist: , timeout performed,  Correct Patient, Correct Site, Correct Laterality,  Correct Procedure, Correct Position, site marked,  Risks and benefits discussed,  Surgical consent,  Pre-op evaluation,  At surgeon's request and post-op pain management  Laterality: Right  Prep: chloraprep       Needles:  Injection technique: Single-shot  Needle Type: Echogenic Stimulator Needle     Needle Length: 10cm  Needle Gauge: 21   Needle insertion depth: 8 cm   Additional Needles:   Procedures:,,,, ultrasound used (permanent image in chart),,    Narrative:  Start time: 07/21/2021 7:45 AM End time: 07/21/2021 7:50 AM Injection made incrementally with aspirations every 5 mL.  Performed by: Personally  Anesthesiologist: Josephine Igo, MD  Additional Notes: Timeout performed. Patient sedated. Relevant anatomy ID'd using [place]. Incremental 2-63m injection of LA with frequent aspiration. Patient tolerated procedure well.     Right Adductor Canal Block

## 2021-07-21 NOTE — Progress Notes (Signed)
Assisted Dr. Royce Macadamia with right, adductor canal, ultrasound guided block. Side rails up, monitors on throughout procedure. See vital signs in flow sheet. Tolerated Procedure well.

## 2021-07-21 NOTE — Transfer of Care (Signed)
Immediate Anesthesia Transfer of Care Note  /Patient: Jason Blackwell  Procedure(s) Performed: RIGHT TOTAL KNEE ARTHROPLASTY (Right: Knee)  Patient Location: PACU  Anesthesia Type:Spinal  Level of Consciousness: awake, alert , oriented and patient cooperative  Airway & Oxygen Therapy: Patient Spontanous Breathing and Patient connected to nasal cannula oxygen  Post-op Assessment: Report given to RN and Post -op Vital signs reviewed and stable  Post vital signs: Reviewed and stable  Last Vitals:  Vitals Value Taken Time  BP 136/65 1022  Temp    Pulse 52   Resp 14   SpO2 100%     Last Pain:  Vitals:   07/21/21 0815  TempSrc:   PainSc: 0-No pain      Patients Stated Pain Goal: 3 (09/81/19 1478)  Complications: No notable events documented.

## 2021-07-21 NOTE — Evaluation (Signed)
Physical Therapy Evaluation Patient Details Name: Jason Blackwell MRN: [id number] DOB: May 01, 1945 Today's Date: 07/21/2021  History of Present Illness  Pt is a 76yo male presenting s/p R-TKA on 07/21/21. PMH BPH, CAD s/p cath, GERD, HLD, HTN.  Clinical Impression  RODRIQUES BADIE is a 76 y.o. male POD 0 s/p R-TKA. Patient reports IND with mobility at baseline. Patient is now limited by functional impairments (see PT problem list below) and requires min assist for transfers, upon standing, pt reporting flushing and dizziness, BP 151/86, further mobility deferred. Once seated, pt reporting decrease in symptoms. Patient instructed in exercise to facilitate ROM and circulation to manage edema. Pt performed incentive spirometry with verbal cues and was able to achieve 1771m; directed pt to complete exercises hourly. Educated pt on wearing KI on R knee and appropriate ice prescription at home. Patient will benefit from continued skilled PT interventions to address impairments and progress towards PLOF. Acute PT will follow to progress mobility and stair training in preparation for safe discharge home.       Recommendations for follow up therapy are one component of a multi-disciplinary discharge planning process, led by the attending physician.  Recommendations may be updated based on patient status, additional functional criteria and insurance authorization.  Follow Up Recommendations Follow physician's recommendations for discharge plan and follow up therapies      Assistance Recommended at Discharge Set up Supervision/Assistance  Patient can return home with the following  A little help with walking and/or transfers;A little help with bathing/dressing/bathroom;Assistance with cooking/housework    Equipment Recommendations Rolling walker (2 wheels)  Recommendations for Other Services       Functional Status Assessment Patient has had a recent decline in their functional status and demonstrates  the ability to make significant improvements in function in a reasonable and predictable amount of time.     Precautions / Restrictions Precautions Precautions: Fall Required Braces or Orthoses: Knee Immobilizer - Left Knee Immobilizer - Left: On when out of bed or walking Restrictions Weight Bearing Restrictions: No      Mobility  Bed Mobility Overal bed mobility: Needs Assistance Bed Mobility: Supine to Sit     Supine to sit: Min assist     General bed mobility comments: min assist for trunk elevation.    Transfers Overall transfer level: Needs assistance Equipment used: Rolling walker (2 wheels) Transfers: Sit to/from Stand, Bed to chair/wheelchair/BSC Sit to Stand: Min assist   Step pivot transfers: Min guard       General transfer comment: Min assist for lift assist, multimodal cues for powering up. Step pivot: VCs for sequencing. Pt reporting some flushing, dizziness, BP 151/86. Directed pt to complete step pivot transfer and provided min guard for safety, no physical assist required, further mobility deferred    Ambulation/Gait               General Gait Details: deferred  Stairs            Wheelchair Mobility    Modified Rankin (Stroke Patients Only)       Balance Overall balance assessment: Needs assistance Sitting-balance support: Feet supported, No upper extremity supported Sitting balance-Leahy Scale: Good     Standing balance support: Reliant on assistive device for balance, During functional activity, Bilateral upper extremity supported Standing balance-Leahy Scale: Poor                               Pertinent  Vitals/Pain Pain Assessment Pain Assessment: 0-10 Pain Score: 3  Pain Location: L knee Pain Descriptors / Indicators: Burning, Discomfort, Operative site guarding Pain Intervention(s): Limited activity within patient's tolerance, Monitored during session, Repositioned, Ice applied    Home Living  Family/patient expects to be discharged to:: Private residence Living Arrangements: Spouse/significant other Available Help at Discharge: Family;Available 24 hours/day Type of Home: Apartment Home Access: Level entry       Home Layout: One level Home Equipment: None      Prior Function Prior Level of Function : Independent/Modified Independent             Mobility Comments: IND ADLs Comments: IND     Hand Dominance        Extremity/Trunk Assessment   Upper Extremity Assessment Upper Extremity Assessment: Overall WFL for tasks assessed    Lower Extremity Assessment Lower Extremity Assessment: RLE deficits/detail;LLE deficits/detail RLE Deficits / Details: MMT ank DF/PF 5/5 RLE Sensation: WNL LLE Deficits / Details: MMT ank DF/PF 5/5, no extensor lag noted LLE Sensation: WNL    Cervical / Trunk Assessment Cervical / Trunk Assessment: Normal  Communication   Communication: No difficulties  Cognition Arousal/Alertness: Awake/alert Behavior During Therapy: WFL for tasks assessed/performed Overall Cognitive Status: Within Functional Limits for tasks assessed                                          General Comments General comments (skin integrity, edema, etc.): Wife Malachy Mood present    Exercises Total Joint Exercises Ankle Circles/Pumps: AROM, Both, 10 reps   Assessment/Plan    PT Assessment Patient needs continued PT services  PT Problem List Decreased strength;Decreased range of motion;Decreased activity tolerance;Decreased balance;Decreased mobility;Decreased coordination;Decreased knowledge of use of DME;Pain       PT Treatment Interventions DME instruction;Gait training;Stair training;Functional mobility training;Therapeutic activities;Therapeutic exercise;Balance training;Neuromuscular re-education;Patient/family education    PT Goals (Current goals can be found in the Care Plan section)  Acute Rehab PT Goals Patient Stated Goal:  Get back to walking PT Goal Formulation: With patient Time For Goal Achievement: 07/28/21 Potential to Achieve Goals: Good    Frequency 7X/week     Co-evaluation               AM-PAC PT "6 Clicks" Mobility  Outcome Measure Help needed turning from your back to your side while in a flat bed without using bedrails?: None Help needed moving from lying on your back to sitting on the side of a flat bed without using bedrails?: A Little Help needed moving to and from a bed to a chair (including a wheelchair)?: A Little Help needed standing up from a chair using your arms (e.g., wheelchair or bedside chair)?: A Little Help needed to walk in hospital room?: A Little Help needed climbing 3-5 steps with a railing? : A Little 6 Click Score: 19    End of Session Equipment Utilized During Treatment: Gait belt;Right knee immobilizer Activity Tolerance: No increased pain;Patient tolerated treatment well Patient left: in chair;with call bell/phone within reach;with chair alarm set;with family/visitor present Nurse Communication: Mobility status PT Visit Diagnosis: Difficulty in walking, not elsewhere classified (R26.2);Pain Pain - Right/Left: Right Pain - part of body: Knee    Time: 0100-7121 PT Time Calculation (min) (ACUTE ONLY): 33 min   Charges:   PT Evaluation $PT Eval Low Complexity: 1 Low PT Treatments $Therapeutic Activity: 8-22 mins  Coolidge Breeze, PT, DPT [facility] Office: [phone] Pager: [phone]  Coolidge Breeze 07/21/2021, 4:25 PM

## 2021-07-21 NOTE — Brief Op Note (Signed)
07/21/2021  9:49 AM  PATIENT:  Jason Blackwell  76 y.o. male  PRE-OPERATIVE DIAGNOSIS:  OSTEOARTHRITIS / DEGENERATIVE JOINT DISEASE RIGHT KNEE  POST-OPERATIVE DIAGNOSIS:  OSTEOARTHRITIS / DEGENERATIVE JOINT   PROCEDURE:  Procedure(s): RIGHT TOTAL KNEE ARTHROPLASTY (Right)  SURGEON:  Surgeon(s) and Role:    * Mcarthur Rossetti, MD - Primary  PHYSICIAN ASSISTANT:  Benita Stabile, PA-C  ANESTHESIA:   regional and spinal  EBL:  20 mL   COUNTS:  YES  TOURNIQUET:   Total Tourniquet Time Documented: Thigh (Right) - 45 minutes Total: Thigh (Right) - 45 minutes   DICTATION: .Other Dictation: Dictation Number [id number]  PLAN OF CARE: Admit for overnight observation  PATIENT DISPOSITION:  PACU - hemodynamically stable.   Delay start of Pharmacological VTE agent (>24hrs) due to surgical blood loss or risk of bleeding: no

## 2021-07-22 DIAGNOSIS — M1711 Unilateral primary osteoarthritis, right knee: Secondary | ICD-10-CM | POA: Diagnosis not present

## 2021-07-22 LAB — BASIC METABOLIC PANEL
Anion gap: 9 (ref 5–15)
BUN: 11 mg/dL (ref 8–23)
CO2: 29 mmol/L (ref 22–32)
Calcium: 8.3 mg/dL — ABNORMAL LOW (ref 8.9–10.3)
Chloride: 96 mmol/L — ABNORMAL LOW (ref 98–111)
Creatinine, Ser: 0.55 mg/dL — ABNORMAL LOW (ref 0.61–1.24)
GFR, Estimated: >60 mL/min (ref >60)
Glucose, Bld: 134 mg/dL — ABNORMAL HIGH (ref 70–99)
Potassium: 2.5 mmol/L — CL (ref 3.5–5.1)
Sodium: 134 mmol/L — ABNORMAL LOW (ref 135–145)

## 2021-07-22 LAB — CBC
HCT: 36.3 % — ABNORMAL LOW (ref 39.0–52.0)
Hemoglobin: 12.2 g/dL — ABNORMAL LOW (ref 13.0–17.0)
MCH: 29.8 pg (ref 26.0–34.0)
MCHC: 33.6 g/dL (ref 30.0–36.0)
MCV: 88.8 fL (ref 80.0–100.0)
Platelets: 274 10*3/uL (ref 150–400)
RBC: 4.09 MIL/uL — ABNORMAL LOW (ref 4.22–5.81)
RDW: 19.1 % — ABNORMAL HIGH (ref 11.5–15.5)
WBC: 9.9 10*3/uL (ref 4.0–10.5)
nRBC: 0 % (ref 0.0–0.2)

## 2021-07-22 MED ORDER — POTASSIUM CHLORIDE CRYS ER 20 MEQ PO TBCR
40.0000 meq | EXTENDED_RELEASE_TABLET | Freq: Two times a day (BID) | ORAL | Status: DC
  Administered 2021-07-22 – 2021-07-23 (×3): 40 meq via ORAL
  Filled 2021-07-22 (×3): qty 2

## 2021-07-22 MED ORDER — POTASSIUM CHLORIDE CRYS ER 20 MEQ PO TBCR: 40.0000 meq | EXTENDED_RELEASE_TABLET | Freq: Two times a day (BID) | ORAL | Status: DC

## 2021-07-22 NOTE — Progress Notes (Signed)
Physical Therapy Treatment Patient Details Name: Jason Blackwell MRN: [id number] DOB: [birth year] Today's Date: 07/22/2021   History of Present Illness Pt is a 76yo male presenting s/p R-TKA on 07/21/21. PMH BPH, CAD s/p cath, GERD, HLD, HTN.    PT Comments    Patient limited by pain and weakness. He required increased assist for bed mobility and transfers today. Mod assist with VC's for hand placement on RW for power up and EOB elevated. He initiated gait training and amb ~5' with min assist to manage walker, block/guard Rt knee and cue step pattern. Pt c/o overheated sensation and chair provided to rest. EOS pt instructed in exercises to facilitate ROM and circulation. Will continue to progress as able.   Recommendations for follow up therapy are one component of a multi-disciplinary discharge planning process, led by the attending physician.  Recommendations may be updated based on patient status, additional functional criteria and insurance authorization.  Follow Up Recommendations  Follow physician's recommendations for discharge plan and follow up therapies     Assistance Recommended at Discharge Set up Supervision/Assistance  Patient can return home with the following A little help with walking and/or transfers;A little help with bathing/dressing/bathroom;Assistance with cooking/housework   Equipment Recommendations  Rolling walker (2 wheels)    Recommendations for Other Services       Precautions / Restrictions Precautions Precautions: Fall Required Braces or Orthoses: Knee Immobilizer - Left Knee Immobilizer - Left: On when out of bed or walking Restrictions Weight Bearing Restrictions: No RLE Weight Bearing: Weight bearing as tolerated     Mobility  Bed Mobility Overal bed mobility: Needs Assistance Bed Mobility: Sit to Supine       Sit to supine: Min assist, HOB elevated   General bed mobility comments: Min assist to rais trunk from max elevated HOB, cues to use  belt for Rt LE but assist needed to control lowering leg to floor.    Transfers Overall transfer level: Needs assistance Equipment used: Rolling walker (2 wheels) Transfers: Sit to/from Stand Sit to Stand: Mod assist, From elevated surface           General transfer comment: Bed elevated and mod assist provided for power up from EOB, cues needed for hand placement.    Ambulation/Gait Ambulation/Gait assistance: Min assist, +2 safety/equipment (chair follow) Gait Distance (Feet): 10 Feet Assistive device: Rolling walker (2 wheels) Gait Pattern/deviations: Step-to pattern, Decreased stride length Gait velocity: decr     General Gait Details: cues for short steps and to maintain safe proximity to RW, manual blocking provided at Rt knee to prevent buckling, pt demonstrated good use of UE's to support Rt knee.   Stairs             Wheelchair Mobility    Modified Rankin (Stroke Patients Only)       Balance Overall balance assessment: Needs assistance Sitting-balance support: Feet supported, No upper extremity supported Sitting balance-Leahy Scale: Good     Standing balance support: Reliant on assistive device for balance, During functional activity, Bilateral upper extremity supported Standing balance-Leahy Scale: Poor                              Cognition Arousal/Alertness: Awake/alert Behavior During Therapy: WFL for tasks assessed/performed Overall Cognitive Status: Within Functional Limits for tasks assessed  Exercises Total Joint Exercises Ankle Circles/Pumps: AROM, Both, 20 reps Quad Sets: AROM, Right, 10 reps Heel Slides: AAROM, 5 reps, Right    General Comments        Pertinent Vitals/Pain Pain Assessment Pain Assessment: 0-10 Pain Score: 6  Pain Location: L knee Pain Descriptors / Indicators: Burning, Discomfort, Operative site guarding Pain Intervention(s): Limited  activity within patient's tolerance, Monitored during session, Repositioned, Ice applied    Home Living                          Prior Function            PT Goals (current goals can now be found in the care plan section) Acute Rehab PT Goals Patient Stated Goal: Get back to walking PT Goal Formulation: With patient Time For Goal Achievement: 07/28/21 Potential to Achieve Goals: Good    Frequency    7X/week      PT Plan Current plan remains appropriate    Co-evaluation              AM-PAC PT "6 Clicks" Mobility   Outcome Measure  Help needed turning from your back to your side while in a flat bed without using bedrails?: None Help needed moving from lying on your back to sitting on the side of a flat bed without using bedrails?: A Little Help needed moving to and from a bed to a chair (including a wheelchair)?: A Little Help needed standing up from a chair using your arms (e.g., wheelchair or bedside chair)?: A Little Help needed to walk in hospital room?: A Little Help needed climbing 3-5 steps with a railing? : A Lot 6 Click Score: 18    End of Session Equipment Utilized During Treatment: Gait belt Activity Tolerance: No increased pain;Patient tolerated treatment well Patient left: in bed;with bed alarm set;with nursing/sitter in room;with family/visitor present;with SCD's reapplied Nurse Communication: Mobility status PT Visit Diagnosis: Difficulty in walking, not elsewhere classified (R26.2);Pain Pain - Right/Left: Right Pain - part of body: Knee     Time: 6468-0321   32 mins  Charges:   $Gait Training 8-22 mins $Therapeutic Exercise 8-22 mins                     Verner Mould, DPT Acute Rehabilitation Services Office [phone] Pager [phone]  07/22/21 3:40 PM

## 2021-07-22 NOTE — Discharge Instructions (Signed)

## 2021-07-22 NOTE — Progress Notes (Signed)
Date and time results received: 07/22/21 0453   Test: Potassium Critical Value: 2.5  Name of Provider Notified: Kathrynn Speed, MD  Orders Received? Yes, see new orders.

## 2021-07-22 NOTE — Plan of Care (Signed)

## 2021-07-22 NOTE — Progress Notes (Signed)
Physical Therapy Treatment Patient Details Name: Jason Blackwell MRN: [id number] DOB: 05/14/1945 Today's Date: 07/22/2021   History of Present Illness Pt is a 76yo male presenting s/p R-TKA on 07/21/21. PMH BPH, CAD s/p cath, GERD, HLD, HTN.    PT Comments    Patient required slightly less assist with bed mobility and sit<>stand from EOB. Min assist for gait and pt increased gait distance to ~10' with close chair follow for safety. He demonstrated improved ability to reduce WB on Rt LE with cues for increased anterior lean on RW. EOS reviewed importance keeping Rt knee in extension as pt has tendency to rotate hip externally and flex knee when at rest. Pt verbalized understanding. Will continue to progress pt as able in acute setting.    Recommendations for follow up therapy are one component of a multi-disciplinary discharge planning process, led by the attending physician.  Recommendations may be updated based on patient status, additional functional criteria and insurance authorization.  Follow Up Recommendations  Follow physician's recommendations for discharge plan and follow up therapies     Assistance Recommended at Discharge Set up Supervision/Assistance  Patient can return home with the following A little help with walking and/or transfers;A little help with bathing/dressing/bathroom;Assistance with cooking/housework   Equipment Recommendations  Rolling walker (2 wheels)    Recommendations for Other Services       Precautions / Restrictions Precautions Precautions: Fall Required Braces or Orthoses: Knee Immobilizer - Left Knee Immobilizer - Left: On when out of bed or walking Restrictions Weight Bearing Restrictions: No RLE Weight Bearing: Weight bearing as tolerated     Mobility  Bed Mobility Overal bed mobility: Needs Assistance Bed Mobility: Sit to Supine     Supine to sit: Min assist, HOB elevated     General bed mobility comments: Min assist to raise trunk  from elevated HOB, cues to use belt for Rt LE but assist needed to control lowering leg to floor. less assist than AM session.    Transfers Overall transfer level: Needs assistance Equipment used: Rolling walker (2 wheels) Transfers: Sit to/from Stand Sit to Stand: Min assist, From elevated surface           General transfer comment: pt requires min cues for hand palcement and bed elevated to complete power up, min assist to rise and steady this session.    Ambulation/Gait Ambulation/Gait assistance: Min assist, +2 safety/equipment (chair follow) Gait Distance (Feet): 10 Feet Assistive device: Rolling walker (2 wheels) Gait Pattern/deviations: Step-to pattern, Decreased stride length Gait velocity: decr     General Gait Details: Assist to manage/position RW. cues for step pattern and to lean forward on walker to WB and reduce pressure on Rt LE, pt reported improved pain with this posture. distance limited by pain and fatigue.   Stairs             Wheelchair Mobility    Modified Rankin (Stroke Patients Only)       Balance Overall balance assessment: Needs assistance Sitting-balance support: Feet supported, No upper extremity supported Sitting balance-Leahy Scale: Good     Standing balance support: Reliant on assistive device for balance, During functional activity, Bilateral upper extremity supported Standing balance-Leahy Scale: Poor                              Cognition Arousal/Alertness: Awake/alert Behavior During Therapy: WFL for tasks assessed/performed Overall Cognitive Status: Within Functional Limits for tasks assessed  Exercises Total Joint Exercises Ankle Circles/Pumps: AROM, Both, 20 reps Quad Sets: AROM, Right, 10 reps Heel Slides: AAROM, 5 reps, Right    General Comments        Pertinent Vitals/Pain Pain Assessment Pain Assessment: 0-10 Pain Score: 6  Pain  Location: L knee Pain Descriptors / Indicators: Burning, Discomfort, Operative site guarding Pain Intervention(s): Limited activity within patient's tolerance, Monitored during session, Repositioned, Ice applied    Home Living                          Prior Function            PT Goals (current goals can now be found in the care plan section) Acute Rehab PT Goals Patient Stated Goal: Get back to walking PT Goal Formulation: With patient Time For Goal Achievement: 07/28/21 Potential to Achieve Goals: Good Progress towards PT goals: Progressing toward goals    Frequency    7X/week      PT Plan Current plan remains appropriate    Co-evaluation              AM-PAC PT "6 Clicks" Mobility   Outcome Measure  Help needed turning from your back to your side while in a flat bed without using bedrails?: None Help needed moving from lying on your back to sitting on the side of a flat bed without using bedrails?: A Little Help needed moving to and from a bed to a chair (including a wheelchair)?: A Little Help needed standing up from a chair using your arms (e.g., wheelchair or bedside chair)?: A Little Help needed to walk in hospital room?: A Little Help needed climbing 3-5 steps with a railing? : A Lot 6 Click Score: 18    End of Session Equipment Utilized During Treatment: Gait belt Activity Tolerance: No increased pain;Patient tolerated treatment well Patient left: in bed;with bed alarm set;with nursing/sitter in room;with family/visitor present;with SCD's reapplied Nurse Communication: Mobility status PT Visit Diagnosis: Difficulty in walking, not elsewhere classified (R26.2);Pain Pain - Right/Left: Right Pain - part of body: Knee     Time: 1450-1517 PT Time Calculation (min) (ACUTE ONLY): 27 min  Charges:  $Gait Training: 8-22 mins $Therapeutic Exercise: 8-22 mins                     Verner Mould, DPT Acute Rehabilitation Services Office  [phone] Pager [phone]  07/22/21 4:07 PM

## 2021-07-22 NOTE — Progress Notes (Signed)
Subjective: 1 Day Post-Op Procedure(s) (LRB): RIGHT TOTAL KNEE ARTHROPLASTY (Right) Patient reports pain as moderate.  Slow mobility.  Hypokalemia, but he does run low potassium regularly (asymptomatic).  Objective: Vital signs in last 24 hours: Temp:  [97.7 F (36.5 C)-99 F (37.2 C)] 98.9 F (37.2 C) (07/08 0657) Pulse Rate:  [48-74] 74 (07/08 0657) Resp:  [14-19] 17 (07/08 0657) BP: (114-149)/(65-83) 137/70 (07/08 0657) SpO2:  [94 %-100 %] 97 % (07/08 0657)  Intake/Output from previous day: 07/07 0701 - 07/08 0700 In: 3394.9 [P.O.:1080; I.V.:2010; IV Piggyback:304.9] Out: 1420 [Urine:1400; Blood:20] Intake/Output this shift: No intake/output data recorded.  Recent Labs    07/22/21 0356  HGB 12.2*   Recent Labs    07/22/21 0356  WBC 9.9  RBC 4.09*  HCT 36.3*  PLT 274   Recent Labs    07/22/21 0356  NA 134*  K 2.5*  CL 96*  CO2 29  BUN 11  CREATININE 0.55*  GLUCOSE 134*  CALCIUM 8.3*   No results for input(s): "LABPT", "INR" in the last 72 hours.  Sensation intact distally Intact pulses distally Dorsiflexion/Plantar flexion intact Incision: scant drainage Compartment soft   Assessment/Plan: 1 Day Post-Op Procedure(s) (LRB): RIGHT TOTAL KNEE ARTHROPLASTY (Right) Up with therapy Plan for discharge tomorrow Discharge home with home health Replace potassium.     Mcarthur Rossetti 07/22/2021, 8:40 AM

## 2021-07-23 DIAGNOSIS — Z96651 Presence of right artificial knee joint: Secondary | ICD-10-CM | POA: Diagnosis not present

## 2021-07-23 DIAGNOSIS — M1711 Unilateral primary osteoarthritis, right knee: Secondary | ICD-10-CM | POA: Diagnosis not present

## 2021-07-23 MED ORDER — METHOCARBAMOL 500 MG PO TABS: 500.0000 mg | ORAL_TABLET | Freq: Four times a day (QID) | ORAL | 0 refills | Status: DC | PRN

## 2021-07-23 MED ORDER — ASPIRIN 81 MG PO CHEW: 81.0000 mg | CHEWABLE_TABLET | Freq: Two times a day (BID) | ORAL | 0 refills | Status: DC

## 2021-07-23 MED ORDER — OXYCODONE HCL 5 MG PO TABS: 5.0000 mg | ORAL_TABLET | ORAL | 0 refills | Status: DC | PRN

## 2021-07-23 NOTE — Progress Notes (Signed)
Patient ID: Jason Blackwell, male   DOB: 12-Dec-1945, 76 y.o.   MRN: [id number] The patient is awake and alert this morning.  He is already working with therapy at the bedside currently.  He had limited mobility for discharge to home yesterday.  He is highly motivated and hopes to be able to go home this afternoon.  His vital signs are stable and his right knee is stable from orthopedic standpoint.  I will put in orders for discharge later this afternoon after his physical therapy sessions.  Obviously, if he is not safe to go home from a mobility standpoint he will need to stay.  Hopefully we can discharge him to home today.

## 2021-07-23 NOTE — Progress Notes (Signed)
Physical Therapy Treatment Patient Details Name: Jason Blackwell MRN: [id number] DOB: 04-14-1945 Today's Date: 07/23/2021   History of Present Illness Pt is a 76yo male presenting s/p R-TKA on 07/21/21. PMH BPH, CAD s/p cath, GERD, HLD, HTN.    PT Comments    Pt very cooperative and with noted improvement in activity tolerance and distance ambulated.  Pt continues to struggle with bed mobility and transfers involving low flat bed as at home.   Recommendations for follow up therapy are one component of a multi-disciplinary discharge planning process, led by the attending physician.  Recommendations may be updated based on patient status, additional functional criteria and insurance authorization.  Follow Up Recommendations  Follow physician's recommendations for discharge plan and follow up therapies     Assistance Recommended at Discharge Set up Supervision/Assistance  Patient can return home with the following A little help with walking and/or transfers;A little help with bathing/dressing/bathroom;Assistance with cooking/housework   Equipment Recommendations  Rolling walker (2 wheels)    Recommendations for Other Services       Precautions / Restrictions Precautions Precautions: Fall Required Braces or Orthoses: Knee Immobilizer - Left Knee Immobilizer - Left: On when out of bed or walking Restrictions Weight Bearing Restrictions: No RLE Weight Bearing: Weight bearing as tolerated     Mobility  Bed Mobility Overal bed mobility: Needs Assistance Bed Mobility: Supine to Sit     Supine to sit: Mod assist     General bed mobility comments: cues for sequence and use of L LE to self assist.  Physical assist to manage R LE and increased assist to bring trunk to upright from level bed    Transfers Overall transfer level: Needs assistance Equipment used: Rolling walker (2 wheels) Transfers: Sit to/from Stand Sit to Stand: Min assist, Mod assist           General  transfer comment: cues for LE management and use of UEs to self assist.  Physical assist to bring wt up and fwd from bed set to home height and to balance in standing    Ambulation/Gait Ambulation/Gait assistance: Min assist Gait Distance (Feet): 76 Feet Assistive device: Rolling walker (2 wheels) Gait Pattern/deviations: Step-to pattern, Decreased stride length Gait velocity: decr     General Gait Details: cues for sequence, posture and position from RW.  Pt states R LE feels much more stable with KI in place   Stairs             Wheelchair Mobility    Modified Rankin (Stroke Patients Only)       Balance Overall balance assessment: Needs assistance Sitting-balance support: Feet supported, No upper extremity supported Sitting balance-Leahy Scale: Good     Standing balance support: Reliant on assistive device for balance, During functional activity, Bilateral upper extremity supported Standing balance-Leahy Scale: Poor                              Cognition Arousal/Alertness: Awake/alert Behavior During Therapy: WFL for tasks assessed/performed Overall Cognitive Status: Within Functional Limits for tasks assessed                                          Exercises Total Joint Exercises Ankle Circles/Pumps: AROM, Both, 20 reps Quad Sets: AROM, Right, 10 reps Heel Slides: AAROM, Right, 15 reps, Supine Straight Leg Raises:  AAROM, Right, 10 reps, Supine Goniometric ROM: AAROM R knee -8 - 45    General Comments        Pertinent Vitals/Pain Pain Assessment Pain Assessment: 0-10 Pain Score: 5  Pain Location: L knee Pain Descriptors / Indicators: Burning, Discomfort, Operative site guarding Pain Intervention(s): Limited activity within patient's tolerance, Monitored during session, Premedicated before session, Ice applied    Home Living                          Prior Function            PT Goals (current goals can  now be found in the care plan section) Acute Rehab PT Goals Patient Stated Goal: Get back to walking PT Goal Formulation: With patient Time For Goal Achievement: 07/28/21 Potential to Achieve Goals: Good Progress towards PT goals: Progressing toward goals    Frequency    7X/week      PT Plan Current plan remains appropriate    Co-evaluation              AM-PAC PT "6 Clicks" Mobility   Outcome Measure  Help needed turning from your back to your side while in a flat bed without using bedrails?: None Help needed moving from lying on your back to sitting on the side of a flat bed without using bedrails?: A Lot Help needed moving to and from a bed to a chair (including a wheelchair)?: A Lot Help needed standing up from a chair using your arms (e.g., wheelchair or bedside chair)?: A Lot Help needed to walk in hospital room?: A Little Help needed climbing 3-5 steps with a railing? : A Lot 6 Click Score: 15    End of Session Equipment Utilized During Treatment: Gait belt;Right knee immobilizer Activity Tolerance: Patient tolerated treatment well Patient left: in chair;with call bell/phone within reach;with chair alarm set Nurse Communication: Mobility status PT Visit Diagnosis: Difficulty in walking, not elsewhere classified (R26.2);Pain Pain - Right/Left: Right Pain - part of body: Knee     Time: 0827-0911 PT Time Calculation (min) (ACUTE ONLY): 44 min  Charges:  $Gait Training: 8-22 mins $Therapeutic Exercise: 8-22 mins $Therapeutic Activity: 8-22 mins                     Debe Coder PT Acute Rehabilitation Services Pager [phone] Office [phone]    Jason Blackwell 07/23/2021, 12:50 PM

## 2021-07-23 NOTE — Progress Notes (Signed)
Physical Therapy Treatment Patient Details Name: Jason Blackwell MRN: [id number] DOB: 1945/03/26 Today's Date: 07/23/2021   History of Present Illness Pt is a 76yo male presenting s/p R-TKA on 07/21/21. PMH BPH, CAD s/p cath, GERD, HLD, HTN.    PT Comments    Pt in good spirits and with marked progress with mobility.  Pt up to ambulate increased distance in hall, negotiated stairs, reviewed don/doff KI, and focused on repetition of bed mobility and transfers.  Pt and spouse eager for dc home this date with HHPT to start tomorrow per pt.   Recommendations for follow up therapy are one component of a multi-disciplinary discharge planning process, led by the attending physician.  Recommendations may be updated based on patient status, additional functional criteria and insurance authorization.  Follow Up Recommendations  Follow physician's recommendations for discharge plan and follow up therapies     Assistance Recommended at Discharge Set up Supervision/Assistance  Patient can return home with the following A little help with walking and/or transfers;A little help with bathing/dressing/bathroom;Assistance with cooking/housework   Equipment Recommendations  Rolling walker (2 wheels)    Recommendations for Other Services       Precautions / Restrictions Precautions Precautions: Fall Required Braces or Orthoses: Knee Immobilizer - Left Knee Immobilizer - Left: On when out of bed or walking Restrictions Weight Bearing Restrictions: No RLE Weight Bearing: Weight bearing as tolerated     Mobility  Bed Mobility Overal bed mobility: Needs Assistance Bed Mobility: Supine to Sit, Sit to Supine     Supine to sit: Min assist Sit to supine: Min assist   General bed mobility comments: Increased time with cues for sequence and use of L LE and UEs to self assist.  Min physical assist to manage R LE    Transfers Overall transfer level: Needs assistance Equipment used: Rolling walker (2  wheels) Transfers: Sit to/from Stand Sit to Stand: Min guard           General transfer comment: Steady assist with cues for LE management and use of UEs to self assist.  Repeated x 3 from recliner and from EOB    Ambulation/Gait Ambulation/Gait assistance: Min guard, Supervision Gait Distance (Feet): 90 Feet Assistive device: Rolling walker (2 wheels) Gait Pattern/deviations: Step-to pattern, Decreased stride length Gait velocity: decr     General Gait Details: min cues for sequence, posture and position from RW   Stairs Stairs: Yes Stairs assistance: Min assist Stair Management: No rails, Step to pattern, Backwards, With walker Number of Stairs: 2 General stair comments: single step twice bkwd with RW and cues for sequence   Wheelchair Mobility    Modified Rankin (Stroke Patients Only)       Balance Overall balance assessment: Needs assistance Sitting-balance support: Feet supported, No upper extremity supported Sitting balance-Leahy Scale: Good     Standing balance support: No upper extremity supported Standing balance-Leahy Scale: Fair                              Cognition Arousal/Alertness: Awake/alert Behavior During Therapy: WFL for tasks assessed/performed Overall Cognitive Status: Within Functional Limits for tasks assessed                                          Exercises Total Joint Exercises Ankle Circles/Pumps: AROM, Both, 20 reps Quad Sets:  AROM, Right, 10 reps Heel Slides: AAROM, Right, 15 reps, Supine Straight Leg Raises: AAROM, Right, 10 reps, Supine Goniometric ROM: AAROM R knee -8 - 45    General Comments General comments (skin integrity, edema, etc.): Spouse present for full session      Pertinent Vitals/Pain Pain Assessment Pain Assessment: 0-10 Pain Score: 4  Pain Location: L knee Pain Descriptors / Indicators: Discomfort, Aching, Sore Pain Intervention(s): Limited activity within patient's  tolerance, Monitored during session, Premedicated before session    Home Living                          Prior Function            PT Goals (current goals can now be found in the care plan section) Acute Rehab PT Goals Patient Stated Goal: Get back to walking PT Goal Formulation: With patient Time For Goal Achievement: 07/28/21 Potential to Achieve Goals: Good Progress towards PT goals: Progressing toward goals    Frequency    7X/week      PT Plan Current plan remains appropriate    Co-evaluation              AM-PAC PT "6 Clicks" Mobility   Outcome Measure  Help needed turning from your back to your side while in a flat bed without using bedrails?: None Help needed moving from lying on your back to sitting on the side of a flat bed without using bedrails?: A Little Help needed moving to and from a bed to a chair (including a wheelchair)?: A Little Help needed standing up from a chair using your arms (e.g., wheelchair or bedside chair)?: A Little Help needed to walk in hospital room?: A Little Help needed climbing 3-5 steps with a railing? : A Little 6 Click Score: 19    End of Session Equipment Utilized During Treatment: Gait belt;Right knee immobilizer Activity Tolerance: Patient tolerated treatment well Patient left: in chair;with call bell/phone within reach;with family/visitor present Nurse Communication: Mobility status PT Visit Diagnosis: Difficulty in walking, not elsewhere classified (R26.2);Pain Pain - Right/Left: Right Pain - part of body: Knee     Time: 1027-2536 PT Time Calculation (min) (ACUTE ONLY): 34 min  Charges:  $Gait Training: 8-22 mins $Therapeutic Exercise: 8-22 mins $Therapeutic Activity: 8-22 mins                     Debe Coder PT Acute Rehabilitation Services Pager [phone] Office [phone]    Jason Blackwell 07/23/2021, 3:16 PM

## 2021-07-23 NOTE — TOC Transition Note (Signed)
Transition of Care [facility]) - CM/SW Discharge Note   Patient Details  Name: Jason Blackwell MRN: [id number] Date of Birth: Feb 09, 1945  [facility] CM/SW Contact:  Ross Ludwig, LCSW Phone Number: 07/23/2021, 11:59 AM   Clinical Narrative:     Patient's home health was prearranged at physician office.  He will be going home with home health through [facility].  CSW signing off please reconsult with any other social work needs, home health agency has been notified of planned discharge.  Patient needed a rolling walker, CSW contacted [employer] and they will deliver to patient's room before he discharges.  CSW signing off, please reconsult if other social work needs arise.    Final next level of care: [place] Barriers to Discharge: Barriers Resolved   Patient Goals and CMS Choice Patient states their goals for this hospitalization and ongoing recovery are:: To return back home with spouse CMS Medicare.gov Compare Post Acute Care list provided to:: Patient Choice offered to / list presented to : Patient  Discharge Placement                       Discharge Plan and Services                DME Arranged: Walker rolling DME Agency: [employer] Date DME Agency Contacted: 07/23/21 Time DME Agency Contacted: [id number] Representative spoke with at DME Agency: Mardene Celeste HH Arranged: PT [place]: [place] Determinants of Health ([place]) Interventions     Readmission Risk Interventions     No data to display

## 2021-07-23 NOTE — Progress Notes (Signed)
Discharge instructions reviewed with patient and family. All concerns addressed. IV removed per protocol, tolerated well. Intact. All belongings given.

## 2021-07-23 NOTE — Plan of Care (Signed)
  Problem: Activity: Goal: Risk for activity intolerance will decrease Outcome: Progressing   Problem: Nutrition: Goal: Adequate nutrition will be maintained Outcome: Progressing   Problem: Pain Managment: Goal: General experience of comfort will improve Outcome: Progressing   Problem: Safety: Goal: Ability to remain free from injury will improve Outcome: Progressing   

## 2021-07-24 ENCOUNTER — Telehealth: Payer: Self-pay | Admitting: *Deleted

## 2021-07-24 ENCOUNTER — Other Ambulatory Visit: Payer: Self-pay | Admitting: Orthopaedic Surgery

## 2021-07-24 DIAGNOSIS — I350 Nonrheumatic aortic (valve) stenosis: Secondary | ICD-10-CM | POA: Diagnosis not present

## 2021-07-24 DIAGNOSIS — I2510 Atherosclerotic heart disease of native coronary artery without angina pectoris: Secondary | ICD-10-CM | POA: Diagnosis not present

## 2021-07-24 DIAGNOSIS — Z7982 Long term (current) use of aspirin: Secondary | ICD-10-CM | POA: Diagnosis not present

## 2021-07-24 DIAGNOSIS — Z471 Aftercare following joint replacement surgery: Secondary | ICD-10-CM | POA: Diagnosis not present

## 2021-07-24 DIAGNOSIS — K5900 Constipation, unspecified: Secondary | ICD-10-CM | POA: Diagnosis not present

## 2021-07-24 DIAGNOSIS — I119 Hypertensive heart disease without heart failure: Secondary | ICD-10-CM | POA: Diagnosis not present

## 2021-07-24 DIAGNOSIS — K649 Unspecified hemorrhoids: Secondary | ICD-10-CM | POA: Diagnosis not present

## 2021-07-24 DIAGNOSIS — G4700 Insomnia, unspecified: Secondary | ICD-10-CM | POA: Diagnosis not present

## 2021-07-24 DIAGNOSIS — Z96651 Presence of right artificial knee joint: Secondary | ICD-10-CM | POA: Diagnosis not present

## 2021-07-24 DIAGNOSIS — K219 Gastro-esophageal reflux disease without esophagitis: Secondary | ICD-10-CM | POA: Diagnosis not present

## 2021-07-24 DIAGNOSIS — E785 Hyperlipidemia, unspecified: Secondary | ICD-10-CM | POA: Diagnosis not present

## 2021-07-24 DIAGNOSIS — Z86010 Personal history of colonic polyps: Secondary | ICD-10-CM | POA: Diagnosis not present

## 2021-07-24 DIAGNOSIS — N138 Other obstructive and reflux uropathy: Secondary | ICD-10-CM | POA: Diagnosis not present

## 2021-07-24 DIAGNOSIS — Z85820 Personal history of malignant melanoma of skin: Secondary | ICD-10-CM | POA: Diagnosis not present

## 2021-07-24 DIAGNOSIS — Z955 Presence of coronary angioplasty implant and graft: Secondary | ICD-10-CM | POA: Diagnosis not present

## 2021-07-24 DIAGNOSIS — N529 Male erectile dysfunction, unspecified: Secondary | ICD-10-CM | POA: Diagnosis not present

## 2021-07-24 DIAGNOSIS — N401 Enlarged prostate with lower urinary tract symptoms: Secondary | ICD-10-CM | POA: Diagnosis not present

## 2021-07-24 DIAGNOSIS — Z87442 Personal history of urinary calculi: Secondary | ICD-10-CM | POA: Diagnosis not present

## 2021-07-24 MED ORDER — ONDANSETRON HCL 4 MG PO TABS: 4.0000 mg | ORAL_TABLET | Freq: Three times a day (TID) | ORAL | 0 refills | Status: DC | PRN

## 2021-07-24 NOTE — Telephone Encounter (Signed)
Ortho bundle D/C call completed. 

## 2021-07-24 NOTE — Telephone Encounter (Signed)
Therapy just called me from patient's home and he is extremely nauseated. Needs something for nausea if we could asap.  Thank you.

## 2021-07-24 NOTE — Telephone Encounter (Signed)
Patient aware of Zofran prescribed.

## 2021-07-24 NOTE — Discharge Summary (Signed)
Patient ID: Jason Blackwell MRN: [id number] DOB/AGE: [birth year] 76 y.o.  Admit date: 07/21/2021 Discharge date: 07/24/2021  Admission Diagnoses:  Principal Problem:   Unilateral primary osteoarthritis, right knee Active Problems:   Status post total right knee replacement   Discharge Diagnoses:  Same  Past Medical History:  Diagnosis Date   Arthritis    right knee   BPH (benign prostatic hypertrophy)    TURP done   Cancer ([place])    skin   Colon adenoma    Complication of anesthesia    Respiratory issues with Succinylcholine   Coronary artery disease    3 stent   Difficult intubation    Ejection fraction    Erectile dysfunction    Fecal incontinence    GERD (gastroesophageal reflux disease)    Heart murmur 4742   LVH, diastolic dysfunction, aortic sclerosis   Hemorrhoids    History of kidney stones    Hyperlipidemia    Hypertension    Insomnia     Surgeries: Procedure(s): RIGHT TOTAL KNEE ARTHROPLASTY on 07/21/2021   Consultants:   Discharged Condition: Improved  Hospital Course: JABARRI STEFANELLI is an 76 y.o. male who was admitted 07/21/2021 for operative treatment ofUnilateral primary osteoarthritis, right knee. Patient has severe unremitting pain that affects sleep, daily activities, and work/hobbies. After pre-op clearance the patient was taken to the operating room on 07/21/2021 and underwent  Procedure(s): RIGHT TOTAL KNEE ARTHROPLASTY.    Patient was given perioperative antibiotics:  Anti-infectives (From admission, onward)    Start     Dose/Rate Route Frequency Ordered Stop   07/21/21 1600  ceFAZolin (ANCEF) IVPB 1 g/50 mL premix        1 g 100 mL/hr over 30 Minutes Intravenous Every 6 hours 07/21/21 1515 07/21/21 2143   07/21/21 0645  ceFAZolin (ANCEF) IVPB 2g/100 mL premix        2 g 200 mL/hr over 30 Minutes Intravenous On call to O.R. 07/21/21 5956 07/21/21 0904        Patient was given sequential compression devices, early ambulation, and  chemoprophylaxis to prevent DVT.  Patient benefited maximally from hospital stay and there were no complications.    Recent vital signs: Patient Vitals for the past 24 hrs:  BP Temp Temp src Pulse Resp SpO2  07/23/21 1410 124/63 98.8 F (37.1 C) Oral 80 17 96 %     Recent laboratory studies:  Recent Labs    07/22/21 0356  WBC 9.9  HGB 12.2*  HCT 36.3*  PLT 274  NA 134*  K 2.5*  CL 96*  CO2 29  BUN 11  CREATININE 0.55*  GLUCOSE 134*  CALCIUM 8.3*     Discharge Medications:   Allergies as of 07/23/2021       Reactions   Rosuvastatin    Other reaction(s): myalgias   Simvastatin Other (See Comments)   Severe muscle fatigue ALL STATINS   Succinylcholine Chloride Other (See Comments)   Respiratory problems   Sulfa Antibiotics Itching   Other reaction(s): rash   Sulfamethoxazole Itching            Medication List     STOP taking these medications    aspirin EC 81 MG tablet Replaced by: aspirin 81 MG chewable tablet       TAKE these medications    acetaminophen 500 MG tablet Commonly known as: TYLENOL Take 500 mg by mouth at bedtime.   amLODipine 10 MG tablet Commonly known as: NORVASC Take 10 mg by  mouth daily.   aspirin 81 MG chewable tablet Chew 1 tablet (81 mg total) by mouth 2 (two) times daily. Replaces: aspirin EC 81 MG tablet   chlorthalidone 25 MG tablet Commonly known as: HYGROTON TAKE 1 TABLET (25 MG TOTAL) BY MOUTH DAILY.   famotidine 20 MG tablet Commonly known as: PEPCID Take 20 mg by mouth every evening.   ferrous sulfate 325 (65 FE) MG tablet Take 325 mg by mouth 2 (two) times daily with a meal.   hydrocortisone cream 1 % Apply 1 Application topically daily as needed for itching.   methocarbamol 500 MG tablet Commonly known as: ROBAXIN Take 1 tablet (500 mg total) by mouth every 6 (six) hours as needed for muscle spasms.   omeprazole 40 MG capsule Commonly known as: PRILOSEC Take 40 mg by mouth in the morning and at  bedtime.   OVER THE COUNTER MEDICATION Take 2 capsules by mouth daily. Balance of nature veggies   OVER THE COUNTER MEDICATION Take 2 capsules by mouth daily. Balance of nature fruits   oxyCODONE 5 MG immediate release tablet Commonly known as: Oxy IR/ROXICODONE Take 1-2 tablets (5-10 mg total) by mouth every 4 (four) hours as needed for moderate pain (pain score 4-6).   Repatha SureClick 248 MG/ML Soaj Generic drug: Evolocumab INJECT 140 MG INTO THE SKIN EVERY 14 (FOURTEEN) DAYS.   tadalafil 5 MG tablet Commonly known as: CIALIS Take 5 mg by mouth daily.   telmisartan 80 MG tablet Commonly known as: MICARDIS Take 80 mg by mouth daily.        Diagnostic Studies: DG Knee Right Port  Result Date: 07/21/2021 CLINICAL DATA:  Right knee arthroplasty EXAM: PORTABLE RIGHT KNEE - 1-2 VIEW COMPARISON:  05/15/2021 FINDINGS: Frontal and cross-table lateral views of the right knee are obtained. Three component right knee arthroplasty is identified in the expected position without signs of acute complication. Postsurgical changes are seen in the overlying soft tissues. IMPRESSION: 1. Unremarkable right knee arthroplasty. Electronically Signed   By: Randa Ngo M.D.   On: 07/21/2021 15:45    Disposition: Discharge disposition: 01-Home or Self Care          Follow-up Information     Mcarthur Rossetti, MD. Go on 08/03/2021.   Specialty: Orthopedic Surgery Why: at 3:00 pm for your first post op appointment in office after surgery Contact information: [place] [address] [phone]         [facility] up.   Specialty: [place] Why: Someone from the home health agency will be in contact with you to arrange your first in home physical therapy appointment after discharge Contact information: [address] [place] [address] [phone]                  Signed: Mcarthur Rossetti 07/24/2021,  7:28 AM

## 2021-07-25 ENCOUNTER — Encounter (HOSPITAL_COMMUNITY): Payer: Self-pay | Admitting: Orthopaedic Surgery

## 2021-07-28 ENCOUNTER — Telehealth: Payer: Self-pay | Admitting: *Deleted

## 2021-07-28 NOTE — Telephone Encounter (Signed)
Ortho bundle 7 day call completed. 

## 2021-07-31 DIAGNOSIS — I350 Nonrheumatic aortic (valve) stenosis: Secondary | ICD-10-CM | POA: Diagnosis not present

## 2021-07-31 DIAGNOSIS — K649 Unspecified hemorrhoids: Secondary | ICD-10-CM | POA: Diagnosis not present

## 2021-07-31 DIAGNOSIS — E785 Hyperlipidemia, unspecified: Secondary | ICD-10-CM | POA: Diagnosis not present

## 2021-07-31 DIAGNOSIS — I119 Hypertensive heart disease without heart failure: Secondary | ICD-10-CM | POA: Diagnosis not present

## 2021-07-31 DIAGNOSIS — G4700 Insomnia, unspecified: Secondary | ICD-10-CM | POA: Diagnosis not present

## 2021-07-31 DIAGNOSIS — Z96651 Presence of right artificial knee joint: Secondary | ICD-10-CM | POA: Diagnosis not present

## 2021-07-31 DIAGNOSIS — K5900 Constipation, unspecified: Secondary | ICD-10-CM | POA: Diagnosis not present

## 2021-07-31 DIAGNOSIS — I2510 Atherosclerotic heart disease of native coronary artery without angina pectoris: Secondary | ICD-10-CM | POA: Diagnosis not present

## 2021-07-31 DIAGNOSIS — Z85820 Personal history of malignant melanoma of skin: Secondary | ICD-10-CM | POA: Diagnosis not present

## 2021-07-31 DIAGNOSIS — N138 Other obstructive and reflux uropathy: Secondary | ICD-10-CM | POA: Diagnosis not present

## 2021-07-31 DIAGNOSIS — N401 Enlarged prostate with lower urinary tract symptoms: Secondary | ICD-10-CM | POA: Diagnosis not present

## 2021-07-31 DIAGNOSIS — K219 Gastro-esophageal reflux disease without esophagitis: Secondary | ICD-10-CM | POA: Diagnosis not present

## 2021-07-31 DIAGNOSIS — Z955 Presence of coronary angioplasty implant and graft: Secondary | ICD-10-CM | POA: Diagnosis not present

## 2021-07-31 DIAGNOSIS — Z87442 Personal history of urinary calculi: Secondary | ICD-10-CM | POA: Diagnosis not present

## 2021-07-31 DIAGNOSIS — Z7982 Long term (current) use of aspirin: Secondary | ICD-10-CM | POA: Diagnosis not present

## 2021-07-31 DIAGNOSIS — Z471 Aftercare following joint replacement surgery: Secondary | ICD-10-CM | POA: Diagnosis not present

## 2021-07-31 DIAGNOSIS — N529 Male erectile dysfunction, unspecified: Secondary | ICD-10-CM | POA: Diagnosis not present

## 2021-07-31 DIAGNOSIS — Z86010 Personal history of colonic polyps: Secondary | ICD-10-CM | POA: Diagnosis not present

## 2021-08-01 ENCOUNTER — Other Ambulatory Visit: Payer: Self-pay | Admitting: Orthopaedic Surgery

## 2021-08-01 ENCOUNTER — Telehealth: Payer: Self-pay | Admitting: Orthopaedic Surgery

## 2021-08-01 MED ORDER — OXYCODONE HCL 5 MG PO TABS: 5.0000 mg | ORAL_TABLET | ORAL | 0 refills | Status: DC | PRN

## 2021-08-01 NOTE — Telephone Encounter (Signed)
Pt called requesting a refill of oxycodone. Please send to [facility] [address] Pt phone number is [phone].

## 2021-08-01 NOTE — Telephone Encounter (Signed)
Please advise 

## 2021-08-02 DIAGNOSIS — I119 Hypertensive heart disease without heart failure: Secondary | ICD-10-CM | POA: Diagnosis not present

## 2021-08-02 DIAGNOSIS — G4700 Insomnia, unspecified: Secondary | ICD-10-CM | POA: Diagnosis not present

## 2021-08-02 DIAGNOSIS — Z7982 Long term (current) use of aspirin: Secondary | ICD-10-CM | POA: Diagnosis not present

## 2021-08-02 DIAGNOSIS — I350 Nonrheumatic aortic (valve) stenosis: Secondary | ICD-10-CM | POA: Diagnosis not present

## 2021-08-02 DIAGNOSIS — N401 Enlarged prostate with lower urinary tract symptoms: Secondary | ICD-10-CM | POA: Diagnosis not present

## 2021-08-02 DIAGNOSIS — Z87442 Personal history of urinary calculi: Secondary | ICD-10-CM | POA: Diagnosis not present

## 2021-08-02 DIAGNOSIS — E785 Hyperlipidemia, unspecified: Secondary | ICD-10-CM | POA: Diagnosis not present

## 2021-08-02 DIAGNOSIS — Z86010 Personal history of colonic polyps: Secondary | ICD-10-CM | POA: Diagnosis not present

## 2021-08-02 DIAGNOSIS — Z96651 Presence of right artificial knee joint: Secondary | ICD-10-CM | POA: Diagnosis not present

## 2021-08-02 DIAGNOSIS — N138 Other obstructive and reflux uropathy: Secondary | ICD-10-CM | POA: Diagnosis not present

## 2021-08-02 DIAGNOSIS — K649 Unspecified hemorrhoids: Secondary | ICD-10-CM | POA: Diagnosis not present

## 2021-08-02 DIAGNOSIS — I2510 Atherosclerotic heart disease of native coronary artery without angina pectoris: Secondary | ICD-10-CM | POA: Diagnosis not present

## 2021-08-02 DIAGNOSIS — K219 Gastro-esophageal reflux disease without esophagitis: Secondary | ICD-10-CM | POA: Diagnosis not present

## 2021-08-02 DIAGNOSIS — K5900 Constipation, unspecified: Secondary | ICD-10-CM | POA: Diagnosis not present

## 2021-08-02 DIAGNOSIS — Z85820 Personal history of malignant melanoma of skin: Secondary | ICD-10-CM | POA: Diagnosis not present

## 2021-08-02 DIAGNOSIS — Z955 Presence of coronary angioplasty implant and graft: Secondary | ICD-10-CM | POA: Diagnosis not present

## 2021-08-02 DIAGNOSIS — Z471 Aftercare following joint replacement surgery: Secondary | ICD-10-CM | POA: Diagnosis not present

## 2021-08-02 DIAGNOSIS — N529 Male erectile dysfunction, unspecified: Secondary | ICD-10-CM | POA: Diagnosis not present

## 2021-08-03 ENCOUNTER — Ambulatory Visit (INDEPENDENT_AMBULATORY_CARE_PROVIDER_SITE_OTHER): Payer: PPO | Admitting: Orthopaedic Surgery

## 2021-08-03 ENCOUNTER — Encounter: Payer: Self-pay | Admitting: Orthopaedic Surgery

## 2021-08-03 ENCOUNTER — Telehealth: Payer: Self-pay | Admitting: *Deleted

## 2021-08-03 DIAGNOSIS — Z96651 Presence of right artificial knee joint: Secondary | ICD-10-CM

## 2021-08-03 NOTE — Telephone Encounter (Signed)
Ortho bundle 14 day in office meeting during his post op appointment completed.

## 2021-08-03 NOTE — Progress Notes (Signed)
The patient is here for his first postoperative visit status post a right total knee arthroplasty.  He has finished home health therapy and transition to Monday to outpatient therapy.  He says his extension is almost full and they have been able to flex him to the right at 90 degrees.  He has been compliant with a baby aspirin twice a day.  He is an active 76 year old gentleman.  Exam his calf is soft.  We removed the staples and place Steri-Strips over his incision.  There is swelling to be expected.  He does have pretty good range of motion for his first visit.  He will transition back to just a baby aspirin once a day.  We will see him back in 4 weeks to see how he is doing overall but no x-rays are needed.  All questions and concerns were answered and addressed.

## 2021-08-07 DIAGNOSIS — Z96651 Presence of right artificial knee joint: Secondary | ICD-10-CM | POA: Diagnosis not present

## 2021-08-09 ENCOUNTER — Other Ambulatory Visit (HOSPITAL_COMMUNITY): Payer: PPO

## 2021-08-10 DIAGNOSIS — Z96651 Presence of right artificial knee joint: Secondary | ICD-10-CM | POA: Diagnosis not present

## 2021-08-14 DIAGNOSIS — Z96651 Presence of right artificial knee joint: Secondary | ICD-10-CM | POA: Diagnosis not present

## 2021-08-15 DIAGNOSIS — D509 Iron deficiency anemia, unspecified: Secondary | ICD-10-CM | POA: Diagnosis not present

## 2021-08-15 DIAGNOSIS — E876 Hypokalemia: Secondary | ICD-10-CM | POA: Diagnosis not present

## 2021-08-17 ENCOUNTER — Other Ambulatory Visit: Payer: Self-pay | Admitting: Orthopaedic Surgery

## 2021-08-17 DIAGNOSIS — Z96651 Presence of right artificial knee joint: Secondary | ICD-10-CM | POA: Diagnosis not present

## 2021-08-17 MED ORDER — OXYCODONE HCL 5 MG PO TABS: 5.0000 mg | ORAL_TABLET | ORAL | 0 refills | Status: DC | PRN

## 2021-08-22 ENCOUNTER — Telehealth: Payer: Self-pay | Admitting: *Deleted

## 2021-08-22 DIAGNOSIS — Z96651 Presence of right artificial knee joint: Secondary | ICD-10-CM | POA: Diagnosis not present

## 2021-08-22 NOTE — Telephone Encounter (Signed)
Ortho bundle 30 day call to patient completed.

## 2021-08-23 ENCOUNTER — Ambulatory Visit (HOSPITAL_COMMUNITY): Payer: PPO | Attending: Cardiology

## 2021-08-23 DIAGNOSIS — I350 Nonrheumatic aortic (valve) stenosis: Secondary | ICD-10-CM | POA: Diagnosis not present

## 2021-08-23 LAB — ECHOCARDIOGRAM COMPLETE
AR max vel: 1.24 cm2
AV Area VTI: 1.18 cm2
AV Area mean vel: 1.16 cm2
AV Mean grad: 27 mmHg
AV Peak grad: 49.3 mmHg
Ao pk vel: 3.51 m/s
Area-P 1/2: 3.16 cm2
S' Lateral: 2.7 cm

## 2021-08-27 ENCOUNTER — Other Ambulatory Visit (INDEPENDENT_AMBULATORY_CARE_PROVIDER_SITE_OTHER): Payer: Self-pay | Admitting: Orthopaedic Surgery

## 2021-08-28 DIAGNOSIS — Z96651 Presence of right artificial knee joint: Secondary | ICD-10-CM | POA: Diagnosis not present

## 2021-08-29 DIAGNOSIS — D0359 Melanoma in situ of other part of trunk: Secondary | ICD-10-CM | POA: Diagnosis not present

## 2021-08-30 ENCOUNTER — Other Ambulatory Visit: Payer: Self-pay | Admitting: Orthopaedic Surgery

## 2021-08-31 DIAGNOSIS — Z96651 Presence of right artificial knee joint: Secondary | ICD-10-CM | POA: Diagnosis not present

## 2021-09-04 ENCOUNTER — Ambulatory Visit (INDEPENDENT_AMBULATORY_CARE_PROVIDER_SITE_OTHER): Payer: PPO | Admitting: Orthopaedic Surgery

## 2021-09-04 ENCOUNTER — Encounter: Payer: Self-pay | Admitting: Orthopaedic Surgery

## 2021-09-04 DIAGNOSIS — Z96651 Presence of right artificial knee joint: Secondary | ICD-10-CM

## 2021-09-04 MED ORDER — OXYCODONE HCL 5 MG PO TABS: 5.0000 mg | ORAL_TABLET | Freq: Four times a day (QID) | ORAL | 0 refills | Status: DC | PRN

## 2021-09-04 NOTE — Progress Notes (Signed)
The patient is now 6 weeks status post a right total knee arthroplasty.  He is doing great overall.  He is very motivated 76 year old gentleman.  On exam his extension is full and his flexion is to 120 degrees.  The knee feels stable.  He had the swelling to be expected but overall he looks great.  From my standpoint he can stop outpatient physical therapy since he has made good progress and he is doing exercises on his own all day long.  I will refill his pain medication.  The next time I need to see him back is not for 3 months unless he is having issues.  We will have a standing AP and lateral of his right operative knee at that visit.  All questions and concerns were answered and addressed.

## 2021-09-12 DIAGNOSIS — D0439 Carcinoma in situ of skin of other parts of face: Secondary | ICD-10-CM | POA: Diagnosis not present

## 2021-09-29 ENCOUNTER — Other Ambulatory Visit: Payer: Self-pay | Admitting: Orthopaedic Surgery

## 2021-09-29 MED ORDER — METHOCARBAMOL 500 MG PO TABS
500.0000 mg | ORAL_TABLET | Freq: Four times a day (QID) | ORAL | 0 refills | Status: DC | PRN
Start: 2021-09-29 — End: 2021-10-30

## 2021-09-29 MED ORDER — OXYCODONE HCL 5 MG PO TABS: 5.0000 mg | ORAL_TABLET | Freq: Four times a day (QID) | ORAL | 0 refills | Status: DC | PRN

## 2021-10-02 ENCOUNTER — Ambulatory Visit: Payer: PPO | Admitting: Physician Assistant

## 2021-10-02 ENCOUNTER — Encounter: Payer: Self-pay | Admitting: Physician Assistant

## 2021-10-02 DIAGNOSIS — M25562 Pain in left knee: Secondary | ICD-10-CM | POA: Diagnosis not present

## 2021-10-02 MED ORDER — METHYLPREDNISOLONE ACETATE 40 MG/ML IJ SUSP
40.0000 mg | INTRAMUSCULAR | Status: AC | PRN
  Administered 2021-10-02: 40 mg via INTRA_ARTICULAR

## 2021-10-02 MED ORDER — LIDOCAINE HCL 1 % IJ SOLN
3.0000 mL | INTRAMUSCULAR | Status: AC | PRN
  Administered 2021-10-02: 3 mL

## 2021-10-02 NOTE — Progress Notes (Signed)
HPI: Jason Blackwell comes in today wanting left knee injection.  He is status post right total knee arthroplasty 07/21/2021.  States right knee is doing well.  Feels that this is now a strong leg.  States he was walking this past Thursday and developed pain in the medial aspect of his left knee.  No injury.  He has tried ice and Tylenol.  He has difficulty getting comfortable at night due to the pain.  Review of systems: Negative for fevers chills.  He is nondiabetic.  Physical exam: General well-developed well-nourished male no acute distress mood and affect appropriate. Right knee good range of motion without pain.  Surgical incisions well-healed.  Left knee tenderness along medial joint line no instability valgus varus stressing.  Slight effusion.  No abnormal warmth erythema.  McMurray's is negative left knee.  Impression: Acute left knee pain  Plan: Given the acute onset of his knee would like to try cortisone injection and aspiration of the knee he is agreeable.  We will see him back in 2 weeks see what type of results he has.  He will be mindful of any mechanical signs symptoms involving the knee.  Questions were encouraged and answered.  If he continues to have pain in the knee at his follow-up appointment recommend AP and lateral views.     Procedure Note  Patient: Jason Blackwell             Date of Birth: 1945-10-20           MRN: [id number]             Visit Date: 10/02/2021  Procedures: Visit Diagnoses: No diagnosis found.  Large Joint Inj: L knee on 10/02/2021 6:48 PM Indications: pain Details: 22 G 1.5 in needle, anterolateral approach  Arthrogram: No  Medications: 3 mL lidocaine 1 %; 40 mg methylPREDNISolone acetate 40 MG/ML Aspirate: 12 mL yellow Outcome: tolerated well, no immediate complications Procedure, treatment alternatives, risks and benefits explained, specific risks discussed. Consent was given by the patient. Immediately prior to procedure a time out was called  to verify the correct patient, procedure, equipment, support staff and site/side marked as required. Patient was prepped and draped in the usual sterile fashion.

## 2021-10-16 ENCOUNTER — Encounter: Payer: Self-pay | Admitting: Physician Assistant

## 2021-10-16 ENCOUNTER — Ambulatory Visit (INDEPENDENT_AMBULATORY_CARE_PROVIDER_SITE_OTHER): Payer: PPO

## 2021-10-16 ENCOUNTER — Telehealth: Payer: Self-pay | Admitting: *Deleted

## 2021-10-16 ENCOUNTER — Ambulatory Visit: Payer: PPO | Admitting: Physician Assistant

## 2021-10-16 DIAGNOSIS — M25562 Pain in left knee: Secondary | ICD-10-CM

## 2021-10-16 NOTE — Telephone Encounter (Signed)
Ortho bundle 90 day in person meeting completed.  

## 2021-10-16 NOTE — Progress Notes (Signed)
HPI: Jason Blackwell returns today for follow-up of his left knee.  He underwent a cortisone injection left knee on 10/02/2021.  He states the injection helped for about 2 or 3 days.  He still having pain medial aspect of the knee.  He is still recovering from his right knee replacement and is doing well.  He does not denies any real mechanical symptoms other than whenever he first gets up the left knee does feel a bit weak.  Review of systems: See HPI otherwise negative  Physical exam: General well-developed well-nourished male no acute distress mood and affect appropriate.  Psych alert and oriented x3 Right knee: Surgical incisions well-healed he has near full extension flexes to approximately 100 1520 degrees.  Left knee tenderness along medial joint line.  Slight effusion no abnormal warmth erythema.  McMurray's is negative.  Radiographs: AP lateral views left knee: Knee is well located.  No acute fractures no bony abnormalities.  Moderate medial compartmental narrowing.  Otherwise knee is well-preserved.  Impression: Left knee pain with recurrent effusion  Plan: Patient has failed conservative treatment which include home exercise program, medications, and cortisone injection.  He is still having giving way of the knee whenever he first begins to ambulate and has a recurrent effusion on exam today.  Therefore we will obtain an MRI of his left knee to rule out meniscal tear and evaluate articular cartilage to help with treatment planning.  Questions were encouraged and answered at length.  He will follow-up after the MRI to go over results discuss treatment.

## 2021-10-16 NOTE — Addendum Note (Signed)
Addended by: Elvin So L on: 10/16/2021 11:03 AM   Modules accepted: Orders

## 2021-10-17 DIAGNOSIS — M65331 Trigger finger, right middle finger: Secondary | ICD-10-CM | POA: Diagnosis not present

## 2021-10-22 NOTE — Progress Notes (Unsigned)
Cardiology Office Note:    Date:  10/22/2021   ID:  Jason Blackwell, DOB [birth year], MRN [id number]  PCP:  Lavone Orn, MD  Cardiologist:  Sinclair Grooms, MD   Referring MD: Lavone Orn, MD   No chief complaint on file.   History of Present Illness:    Jason Blackwell is a 76 y.o. male with a hx of CAD with ostial to proximal LAD DES 3 in 2015, moderate aortic stenosis 2023 echo, hypertension, hyperlipidemia, and diastolic left ventricular dysfunction.   Last seen for surgical clearance prior to right TKR 07/21/2021.  Seen by Robbie Lis in July and cleared for total right knee replacement.  We have been following mild aortic stenosis with last echo done 2 years prior.  Being ordered a follow-up echo that demonstrated significant progression from a 2 m/s velocity in 2021 to 3.5 m/s velocity in 2023.  He is asymptomatic.  He is rehabbing from his knee replacement.  He does have some shortness of breath without that he feels it is related to deconditioning.  Past Medical History:  Diagnosis Date   Arthritis    right knee   BPH (benign prostatic hypertrophy)    TURP done   Cancer [facility])    skin   Colon adenoma    Complication of anesthesia    Respiratory issues with Succinylcholine   Coronary artery disease    3 stent   Difficult intubation    Ejection fraction    Erectile dysfunction    Fecal incontinence    GERD (gastroesophageal reflux disease)    Heart murmur 9767   LVH, diastolic dysfunction, aortic sclerosis   Hemorrhoids    History of kidney stones    Hyperlipidemia    Hypertension    Insomnia     Past Surgical History:  Procedure Laterality Date   CATARACT EXTRACTION W/PHACO Right 09/24/2018   Procedure: CATARACT EXTRACTION PHACO AND INTRAOCULAR LENS PLACEMENT (IOC)COMPLICATED RIGHT;  Surgeon: Leandrew Koyanagi, MD;  Location: [place];  Service: Ophthalmology;  Laterality: Right;  MAYLUGIN   CATARACT EXTRACTION W/PHACO Left 10/15/2018    Procedure: CATARACT EXTRACTION PHACO AND INTRAOCULAR LENS PLACEMENT (IOC) RIGHT  01:16.3  17.9%  13.79;  Surgeon: Leandrew Koyanagi, MD;  Location: [place];  Service: Ophthalmology;  Laterality: Left;   LEFT HEART CATHETERIZATION WITH CORONARY ANGIOGRAM N/A 12/31/2013   Procedure: LEFT HEART CATHETERIZATION WITH CORONARY ANGIOGRAM;  Surgeon: Sinclair Grooms, MD;  Location: [facility] CATH LAB;  Service: Cardiovascular;  Laterality: N/A; Ostial and mid LAD stenting   PROSTATE BIOPSY  ~ [place] Right 01/16/2011   precancerous lesion from arm   TONSILLECTOMY     as a child   TOTAL KNEE ARTHROPLASTY Right 07/21/2021   Procedure: RIGHT TOTAL KNEE ARTHROPLASTY;  Surgeon: Mcarthur Rossetti, MD;  Location: [facility];  Service: Orthopedics;  Laterality: Right;   TRANSURETHRAL RESECTION OF PROSTATE N/A 07/09/2019   Procedure: TRANSURETHRAL RESECTION OF THE PROSTATE (TURP);  Surgeon: Franchot Gallo, MD;  Location: [facility];  Service: Urology;  Laterality: N/A;    Current Medications: No outpatient medications have been marked as taking for the 10/23/21 encounter (Appointment) with Belva Crome, MD.     Allergies:   Rosuvastatin, Simvastatin, Succinylcholine chloride, Sulfa antibiotics, and Sulfamethoxazole   Social History   Socioeconomic History   Marital status: Married    Spouse name: Not on file   Number of children: Not on file   Years  of education: Not on file   Highest education level: Not on file  Occupational History   Not on file  Tobacco Use   Smoking status: Never   Smokeless tobacco: Never  Vaping Use   Vaping Use: Never used  Substance and Sexual Activity   Alcohol use: Yes    Alcohol/week: 28.0 standard drinks of alcohol    Types: 28 Glasses of wine per week    Comment: occas.   Drug use: No   Sexual activity: Yes  Other Topics Concern   Not on file  Social History Narrative   Not on file   Social Determinants of Health    Financial Resource Strain: Not on file  Food Insecurity: Not on file  Transportation Needs: Not on file  Physical Activity: Not on file  Stress: Not on file  Social Connections: Not on file     Family History: The patient's family history includes Cancer - Colon in his maternal uncle; Diabetes in his sister; Heart attack in his father; Heart disease in his mother.  ROS:   Please see the history of present illness.    No orthopnea, PND, angina, palpitations, or syncope.  All other systems reviewed and are negative.  EKGs/Labs/Other Studies Reviewed:    The following studies were reviewed today:  ECHO 08/23/2021: IMPRESSIONS   1. Left ventricular ejection fraction, by estimation, is 60 to 65%. The  left ventricle has normal function. The left ventricle has no regional  wall motion abnormalities. There is mild left ventricular hypertrophy.  Left ventricular diastolic parameters  are consistent with Grade I diastolic dysfunction (impaired relaxation).   2. Right ventricular systolic function is normal. The right ventricular  size is normal. Tricuspid regurgitation signal is inadequate for assessing  PA pressure.   3. Left atrial size was mildly dilated.   4. Right atrial size was mildly dilated.   5. The mitral valve is normal in structure. No evidence of mitral valve  regurgitation. No evidence of mitral stenosis. Moderate mitral annular  calcification.   6. The aortic valve was not well visualized. There is moderate  calcification of the aortic valve. Aortic valve regurgitation is not  visualized. Moderate aortic valve stenosis. Aortic valve area, by VTI  measures 1.18 cm. Aortic valve mean gradient  measures 27.0 mmHg.   7. The inferior vena cava is normal in size with greater than 50%  respiratory variability, suggesting right atrial pressure of 3 mmHg.   EKG:  EKG EKG done in July was reviewed.  The tracing demonstrated sinus bradycardia with leftward axis but no acute  changes.  Recent Labs: 07/22/2021: BUN 11; Creatinine, Ser 0.55; Hemoglobin 12.2; Platelets 274; Potassium 2.5; Sodium 134  Recent Lipid Panel    Component Value Date/Time   CHOL 149 05/21/2017 0930   TRIG 89 05/21/2017 0930   HDL 59 05/21/2017 0930   CHOLHDL 2.5 05/21/2017 0930   LDLCALC 72 05/21/2017 0930    Physical Exam:    VS:  There were no vitals taken for this visit.    Wt Readings from Last 3 Encounters:  07/21/21 237 lb (107.5 kg)  07/20/21 237 lb (107.5 kg)  07/12/21 236 lb (107 kg)     GEN: No JVD. No acute distress HEENT: Normal NECK: No JVD. LYMPHATICS: No lymphadenopathy CARDIAC: 3/6 crescendo decrescendo aortic stenosis murmur. RRR no gallop, or edema. VASCULAR:  Normal Pulses. No bruits. RESPIRATORY:  Clear to auscultation without rales, wheezing or rhonchi  ABDOMEN: Soft, non-tender, non-distended, No  pulsatile mass, MUSCULOSKELETAL: No deformity  SKIN: Warm and dry NEUROLOGIC:  Alert and oriented x 3 PSYCHIATRIC:  Normal affect   ASSESSMENT:    1. Nonrheumatic aortic valve stenosis   2. Essential hypertension   3. Coronary artery disease involving native coronary artery of native heart without angina pectoris   4. Mixed hyperlipidemia   5. First degree AV block    PLAN:    In order of problems listed above:  Moderate calcific aortic stenosis.  May be having symptoms related to progression.  2D Doppler echocardiogram in February April timeframe then follow-up with Dr. Darlina Guys to consider work-up and progression to aortic valve replacement. Blood pressure control is adequate and clearly she will do by aortic stenosis.  Current medical regimen is high-grade time 25 mg/day, amlodipine 10 mg/day, and Micardis.  We may need to start dialing his medications back some. Continue aspirin 81 mg/day and Repatha. Continue Repatha No EKG done today.  We will need to be done next appointment.  He seems to be having a relatively rapid progression of  aortic stenosis.  Some shortness of breath now.  I will not wait until next August to do a repeat echo.  He will be done in 6 to 8 months and he will then be seen by Dr. Angelena Form to consider whether or not it makes sense to move forward with planning for TAVR.  Patient also has coronary disease.  I would like for Dr. Angelena Form to assume him as a chronic longitudinal patient.   Medication Adjustments/Labs and Tests Ordered: Current medicines are reviewed at length with the patient today.  Concerns regarding medicines are outlined above.  No orders of the defined types were placed in this encounter.  No orders of the defined types were placed in this encounter.   There are no Patient Instructions on file for this visit.   Signed, Sinclair Grooms, MD  10/22/2021 3:27 PM    [facility]

## 2021-10-23 ENCOUNTER — Encounter: Payer: Self-pay | Admitting: Interventional Cardiology

## 2021-10-23 ENCOUNTER — Ambulatory Visit: Payer: PPO | Attending: Interventional Cardiology | Admitting: Interventional Cardiology

## 2021-10-23 VITALS — BP 128/70 | HR 71 | Ht 70.0 in | Wt 227.4 lb

## 2021-10-23 DIAGNOSIS — E782 Mixed hyperlipidemia: Secondary | ICD-10-CM

## 2021-10-23 DIAGNOSIS — I2510 Atherosclerotic heart disease of native coronary artery without angina pectoris: Secondary | ICD-10-CM | POA: Diagnosis not present

## 2021-10-23 DIAGNOSIS — I10 Essential (primary) hypertension: Secondary | ICD-10-CM

## 2021-10-23 DIAGNOSIS — I350 Nonrheumatic aortic (valve) stenosis: Secondary | ICD-10-CM

## 2021-10-23 DIAGNOSIS — I440 Atrioventricular block, first degree: Secondary | ICD-10-CM | POA: Diagnosis not present

## 2021-10-23 NOTE — Patient Instructions (Signed)
Medication Instructions:  Your physician recommends that you continue on your current medications as directed. Please refer to the Current Medication list given to you today.  *If you need a refill on your cardiac medications before your next appointment, please call your pharmacy*  Lab Work: NONE  Testing/Procedures: Your physician has requested that you have an echocardiogram in February or March 2024. Echocardiography is a painless test that uses sound waves to create images of your heart. It provides your doctor with information about the size and shape of your heart and how well your heart's chambers and valves are working. This procedure takes approximately one hour. There are no restrictions for this procedure.  Follow-Up: At [facility], you and your health needs are our priority.  As part of our continuing mission to provide you with exceptional heart care, we have created designated Provider Care Teams.  These Care Teams include your primary Cardiologist (physician) and Advanced Practice Providers (APPs -  Physician Assistants and Nurse Practitioners) who all work together to provide you with the care you need, when you need it.   Your next appointment:   6 month(s)  The format for your next appointment:   In Person  Provider:   Lauree Chandler, MD  Important Information About Sugar

## 2021-10-24 ENCOUNTER — Ambulatory Visit
Admission: RE | Admit: 2021-10-24 | Discharge: 2021-10-24 | Disposition: A | Discharge status: Home / Self Care | Payer: PPO | Source: Ambulatory Visit | Attending: Physician Assistant | Admitting: Physician Assistant

## 2021-10-24 DIAGNOSIS — M25562 Pain in left knee: Secondary | ICD-10-CM

## 2021-10-28 DIAGNOSIS — Z23 Encounter for immunization: Secondary | ICD-10-CM | POA: Diagnosis not present

## 2021-10-30 ENCOUNTER — Ambulatory Visit: Payer: PPO | Admitting: Orthopaedic Surgery

## 2021-10-30 ENCOUNTER — Encounter: Payer: Self-pay | Admitting: Orthopaedic Surgery

## 2021-10-30 ENCOUNTER — Other Ambulatory Visit: Payer: Self-pay | Admitting: Orthopaedic Surgery

## 2021-10-30 DIAGNOSIS — M25562 Pain in left knee: Secondary | ICD-10-CM | POA: Diagnosis not present

## 2021-10-30 DIAGNOSIS — M1712 Unilateral primary osteoarthritis, left knee: Secondary | ICD-10-CM | POA: Diagnosis not present

## 2021-10-30 MED ORDER — METHOCARBAMOL 500 MG PO TABS: 500.0000 mg | ORAL_TABLET | Freq: Four times a day (QID) | ORAL | 0 refills | Status: DC | PRN

## 2021-10-30 MED ORDER — OXYCODONE HCL 5 MG PO TABS: 5.0000 mg | ORAL_TABLET | Freq: Four times a day (QID) | ORAL | 0 refills | Status: DC | PRN

## 2021-10-30 NOTE — Progress Notes (Signed)
The patient comes in for follow-up today to go over an MRI of his left knee.  He is an active 76 year old gentleman who is 14 weeks out from a right total knee arthroplasty.  That knee is doing very well but his left knee has been bothering him quite a bit.  He had stepped wrong on that knee and when we saw him last it had a large effusion that was aspirated from the knee and steroid injection placed in the knee.  He has had global tenderness but now his tenderness is mainly on the medial aspect of his left knee and is staying painful with weightbearing.  I did go over the MRI with him as well as the report and images.  He has significant subchondral edema all throughout the medial femoral condyle and areas of full-thickness cartilage loss throughout the medial compartment and the as well as mild cartilage loss in the other compartments of the knee.  There is also extensive tearing of the meniscus of both the medial and lateral meniscus mainly at the weightbearing surface of the meniscus on both sides.  On exam he has medial joint line tenderness.  There is some slight varus malalignment that is correctable and a mild effusion of the left knee.  The medial joint tenderness is quite significant.  His right operative knee is moving great.  This point we are recommending a knee replacement for his left knee given the MRI findings and his clinical exam findings showing the significant arthritic changes in his knee.  He will continue to offload that left knee using a cane in his right hand.  We will work on getting him set up for this left knee replacement.  He is fully aware of what the surgery involves having had this done this year on his right knee.  We discussed the risk and benefits of surgery and what to expect.

## 2021-11-06 ENCOUNTER — Ambulatory Visit: Payer: PPO | Admitting: Physician Assistant

## 2021-11-06 ENCOUNTER — Other Ambulatory Visit: Payer: Self-pay | Admitting: Interventional Cardiology

## 2021-11-06 DIAGNOSIS — I2510 Atherosclerotic heart disease of native coronary artery without angina pectoris: Secondary | ICD-10-CM

## 2021-11-06 DIAGNOSIS — E785 Hyperlipidemia, unspecified: Secondary | ICD-10-CM

## 2021-11-15 ENCOUNTER — Other Ambulatory Visit: Payer: Self-pay

## 2021-11-20 ENCOUNTER — Other Ambulatory Visit: Payer: Self-pay

## 2021-11-21 ENCOUNTER — Other Ambulatory Visit: Payer: Self-pay | Admitting: Physician Assistant

## 2021-11-21 DIAGNOSIS — M1712 Unilateral primary osteoarthritis, left knee: Secondary | ICD-10-CM

## 2021-11-23 NOTE — Progress Notes (Addendum)
COVID Vaccine Completed:  Yes  Date of COVID positive in last 90 days:  No  PCP - Syliva Overman, MD  Cardiologist - Daneen Schick, MD [place], MD  Chest x-ray - 04-14-21 Epic EKG - 07-24-21 Epic Stress Test - 2018 Epic ECHO - 08-23-21 Epic Cardiac Cath - 2017 per patient with stent placement Pacemaker/ICD device last checked: Spinal Cord Stimulator:  N/A  Bowel Prep - N/A  Sleep Study - N/A CPAP -   Fasting Blood Sugar - N/A Checks Blood Sugar _____ times a day  Last dose of GLP1 agonist-  N/A GLP1 instructions:  N/A   Last dose of SGLT-2 inhibitors-  N/A SGLT-2 instructions: N/A   Blood Thinner Instructions: Aspirin Instructions:  ASA 81.  Patient will hold until after surgery  Last Dose:  11-28-21  Activity level:  Can go up a flight of stairs and perform activities of daily living without stopping and without symptoms of chest pain or shortness of breath.  Anesthesia review:  moderate aortic stenosis, CAD, L ventricular dysfunction, HTN  Patient denies shortness of breath, fever, cough and chest pain at PAT appointment  Patient verbalized understanding of instructions that were given to them at the PAT appointment. Patient was also instructed that they will need to review over the PAT instructions again at home before surgery.

## 2021-11-23 NOTE — Patient Instructions (Addendum)
SURGICAL WAITING ROOM VISITATION Patients having surgery or a procedure may have no more than 2 support people in the waiting area - these visitors may rotate.   Children under the age of 9 must have an adult with them who is not the patient. If the patient needs to stay at the hospital during part of their recovery, the visitor guidelines for inpatient rooms apply. Pre-op nurse will coordinate an appropriate time for 1 support person to accompany patient in pre-op.  This support person may not rotate.    Please refer to the [facility] website for the visitor guidelines for Inpatients (after your surgery is over and you are in a regular room).      Your procedure is scheduled on: 12-01-21   Report to [facility] Main Entrance    Report to admitting at 7:30 AM   Call this number if you have problems the morning of surgery [phone]   Do not eat food :After Midnight.   After Midnight you may have the following liquids until 7:00 AM DAY OF SURGERY  Water Non-Citrus Juices (without pulp, NO RED) Carbonated Beverages Black Coffee (NO MILK/CREAM OR CREAMERS, sugar ok)  Clear Tea (NO MILK/CREAM OR CREAMERS, sugar ok) regular and decaf                             Plain Jell-O (NO RED)                                           Fruit ices (not with fruit pulp, NO RED)                                     Popsicles (NO RED)                                                               Sports drinks like Gatorade (NO RED)                   The day of surgery:  Drink ONE (1) Pre-Surgery Clear Ensure at 7:00 AM the morning of surgery. Drink in one sitting. Do not sip.  This drink was given to you during your hospital  pre-op appointment visit. Nothing else to drink after completing the Pre-Surgery Clear Ensure .          If you have questions, please contact your surgeon's office.   FOLLOW ANY ADDITIONAL PRE OP INSTRUCTIONS YOU RECEIVED FROM YOUR SURGEON'S OFFICE!!!      Oral Hygiene is also important to reduce your risk of infection.                                    Remember - BRUSH YOUR TEETH THE MORNING OF SURGERY WITH YOUR REGULAR TOOTHPASTE   Do NOT smoke after Midnight   Take these medicines the morning of surgery with A SIP OF WATER:   Amlodipine  Omeprazole  Oxycodone if needed  Tylenol if  needed  DO NOT TAKE ANY ORAL DIABETIC MEDICATIONS DAY OF YOUR SURGERY  Bring CPAP mask and tubing day of surgery.                              You may not have any metal on your body including  jewelry, and body piercing             Do not wear lotions, powders, cologne, or deodorant              Men may shave face and neck.   Do not bring valuables to the hospital. [place].   Contacts, dentures or bridgework may not be worn into surgery.   Bring small overnight bag day of surgery.   DO NOT [place]. PHARMACY WILL DISPENSE MEDICATIONS LISTED ON YOUR MEDICATION LIST TO YOU DURING YOUR ADMISSION [place]!    Special Instructions: Bring a copy of your healthcare power of attorney and living will documents the day of surgery if you haven't scanned them before.              Please read over the following fact sheets you were given: IF [place]  If you received a COVID test during your pre-op visit  it is requested that you wear a mask when out in public, stay away from anyone that may not be feeling well and notify your surgeon if you develop symptoms. If you test positive for Covid or have been in contact with anyone that has tested positive in the last 10 days please notify you surgeon.  [place] - Preparing for Surgery Before surgery, you can play an important role.  Because skin is not sterile, your skin needs to be as free of germs as possible.  You can reduce the number of germs on your skin by  washing with CHG (chlorahexidine gluconate) soap before surgery.  CHG is an antiseptic cleaner which kills germs and bonds with the skin to continue killing germs even after washing. Please DO NOT use if you have an allergy to CHG or antibacterial soaps.  If your skin becomes reddened/irritated stop using the CHG and inform your nurse when you arrive at Short Stay. Do not shave (including legs and underarms) for at least 48 hours prior to the first CHG shower.  You may shave your face/neck.  Please follow these instructions carefully:  1.  Shower with CHG Soap the night before surgery and the  morning of surgery.  2.  If you choose to wash your hair, wash your hair first as usual with your normal  shampoo.  3.  After you shampoo, rinse your hair and body thoroughly to remove the shampoo.                             4.  Use CHG as you would any other liquid soap.  You can apply chg directly to the skin and wash.  Gently with a scrungie or clean washcloth.  5.  Apply the CHG Soap to your body ONLY FROM THE NECK DOWN.   Do   not use on face/ open  Wound or open sores. Avoid contact with eyes, ears mouth and   genitals (private parts).                       Wash face,  Genitals (private parts) with your normal soap.             6.  Wash thoroughly, paying special attention to the area where your    surgery  will be performed.  7.  Thoroughly rinse your body with warm water from the neck down.  8.  DO NOT shower/wash with your normal soap after using and rinsing off the CHG Soap.                9.  Pat yourself dry with a clean towel.            10.  Wear clean pajamas.            11.  Place clean sheets on your bed the night of your first shower and do not  sleep with pets. Day of Surgery : Do not apply any lotions/deodorants the morning of surgery.  Please wear clean clothes to the hospital/surgery center.  FAILURE TO FOLLOW THESE INSTRUCTIONS MAY RESULT IN THE CANCELLATION  OF YOUR SURGERY  PATIENT SIGNATURE_________________________________  NURSE SIGNATURE__________________________________  ________________________________________________________________________    Adam Phenix  An incentive spirometer is a tool that can help keep your lungs clear and active. This tool measures how well you are filling your lungs with each breath. Taking long deep breaths may help reverse or decrease the chance of developing breathing (pulmonary) problems (especially infection) following: A long period of time when you are unable to move or be active. BEFORE THE PROCEDURE  If the spirometer includes an indicator to show your best effort, your nurse or respiratory therapist will set it to a desired goal. If possible, sit up straight or lean slightly forward. Try not to slouch. Hold the incentive spirometer in an upright position. INSTRUCTIONS FOR USE  Sit on the edge of your bed if possible, or sit up as far as you can in bed or on a chair. Hold the incentive spirometer in an upright position. Breathe out normally. Place the mouthpiece in your mouth and seal your lips tightly around it. Breathe in slowly and as deeply as possible, raising the piston or the ball toward the top of the column. Hold your breath for 3-5 seconds or for as long as possible. Allow the piston or ball to fall to the bottom of the column. Remove the mouthpiece from your mouth and breathe out normally. Rest for a few seconds and repeat Steps 1 through 7 at least 10 times every 1-2 hours when you are awake. Take your time and take a few normal breaths between deep breaths. The spirometer may include an indicator to show your best effort. Use the indicator as a goal to work toward during each repetition. After each set of 10 deep breaths, practice coughing to be sure your lungs are clear. If you have an incision (the cut made at the time of surgery), support your incision when coughing by placing a  pillow or rolled up towels firmly against it. Once you are able to get out of bed, walk around indoors and cough well. You may stop using the incentive spirometer when instructed by your caregiver.  RISKS AND COMPLICATIONS Take your time so you do not get dizzy or light-headed. If you are in pain,  you may need to take or ask for pain medication before doing incentive spirometry. It is harder to take a deep breath if you are having pain. AFTER USE Rest and breathe slowly and easily. It can be helpful to keep track of a log of your progress. Your caregiver can provide you with a simple table to help with this. If you are using the spirometer at home, follow these instructions: [place] IF:  You are having difficultly using the spirometer. You have trouble using the spirometer as often as instructed. Your pain medication is not giving enough relief while using the spirometer. You develop fever of 100.5 F (38.1 C) or higher. SEEK IMMEDIATE MEDICAL CARE IF:  You cough up bloody sputum that had not been present before. You develop fever of 102 F (38.9 C) or greater. You develop worsening pain at or near the incision site. MAKE SURE YOU:  Understand these instructions. Will watch your condition. Will get help right away if you are not doing well or get worse. Document Released: 05/14/2006 Document Revised: 03/26/2011 Document Reviewed: 07/15/2006 ExitCare Patient Information 2014 ExitCare, [place].   ________________________________________________________________________   WHAT IS A BLOOD TRANSFUSION? Blood Transfusion Information  A transfusion is the replacement of blood or some of its parts. Blood is made up of multiple cells which provide different functions. Red blood cells carry oxygen and are used for blood loss replacement. White blood cells fight against infection. Platelets control bleeding. Plasma helps clot blood. Other blood products are available for specialized  needs, such as hemophilia or other clotting disorders. BEFORE THE TRANSFUSION  Who gives blood for transfusions?  Healthy volunteers who are fully evaluated to make sure their blood is safe. This is blood bank blood. Transfusion therapy is the safest it has ever been in the practice of medicine. Before blood is taken from a donor, a complete history is taken to make sure that person has no history of diseases nor engages in risky social behavior (examples are intravenous drug use or sexual activity with multiple partners). The donor's travel history is screened to minimize risk of transmitting infections, such as malaria. The donated blood is tested for signs of infectious diseases, such as HIV and hepatitis. The blood is then tested to be sure it is compatible with you in order to minimize the chance of a transfusion reaction. If you or a relative donates blood, this is often done in anticipation of surgery and is not appropriate for emergency situations. It takes many days to process the donated blood. RISKS AND COMPLICATIONS Although transfusion therapy is very safe and saves many lives, the main dangers of transfusion include:  Getting an infectious disease. Developing a transfusion reaction. This is an allergic reaction to something in the blood you were given. Every precaution is taken to prevent this. The decision to have a blood transfusion has been considered carefully by your caregiver before blood is given. Blood is not given unless the benefits outweigh the risks. AFTER THE TRANSFUSION Right after receiving a blood transfusion, you will usually feel much better and more energetic. This is especially true if your red blood cells have gotten low (anemic). The transfusion raises the level of the red blood cells which carry oxygen, and this usually causes an energy increase. The nurse administering the transfusion will monitor you carefully for complications. HOME CARE INSTRUCTIONS  No special  instructions are needed after a transfusion. You may find your energy is better. Speak with your caregiver about any  limitations on activity for underlying diseases you may have. SEEK MEDICAL CARE IF:  Your condition is not improving after your transfusion. You develop redness or irritation at the intravenous (IV) site. SEEK IMMEDIATE MEDICAL CARE IF:  Any of the following symptoms occur over the next 12 hours: Shaking chills. You have a temperature by mouth above 102 F (38.9 C), not controlled by medicine. Chest, back, or muscle pain. People around you feel you are not acting correctly or are confused. Shortness of breath or difficulty breathing. Dizziness and fainting. You get a rash or develop hives. You have a decrease in urine output. Your urine turns a dark color or changes to pink, red, or brown. Any of the following symptoms occur over the next 10 days: You have a temperature by mouth above 102 F (38.9 C), not controlled by medicine. Shortness of breath. Weakness after normal activity. The white part of the eye turns yellow (jaundice). You have a decrease in the amount of urine or are urinating less often. Your urine turns a dark color or changes to pink, red, or brown. Document Released: 12/30/1999 Document Revised: 03/26/2011 Document Reviewed: 08/18/2007 [facility] Patient Information 2014 [place], [place].  _______________________________________________________________________

## 2021-11-28 ENCOUNTER — Encounter (HOSPITAL_COMMUNITY): Payer: Self-pay

## 2021-11-28 ENCOUNTER — Encounter (HOSPITAL_COMMUNITY)
Admission: RE | Admit: 2021-11-28 | Discharge: 2021-11-28 | Disposition: A | Discharge status: Home / Self Care | Payer: PPO | Source: Ambulatory Visit | Attending: Orthopaedic Surgery | Admitting: Orthopaedic Surgery

## 2021-11-28 ENCOUNTER — Other Ambulatory Visit: Payer: Self-pay

## 2021-11-28 VITALS — BP 148/71 | HR 64 | Temp 98.0°F | Resp 14 | Ht 70.0 in | Wt 227.0 lb

## 2021-11-28 DIAGNOSIS — I2510 Atherosclerotic heart disease of native coronary artery without angina pectoris: Secondary | ICD-10-CM | POA: Insufficient documentation

## 2021-11-28 DIAGNOSIS — M1712 Unilateral primary osteoarthritis, left knee: Secondary | ICD-10-CM | POA: Diagnosis not present

## 2021-11-28 DIAGNOSIS — Z01818 Encounter for other preprocedural examination: Secondary | ICD-10-CM | POA: Diagnosis not present

## 2021-11-28 LAB — BASIC METABOLIC PANEL
Anion gap: 11 (ref 5–15)
BUN: 10 mg/dL (ref 8–23)
CO2: 25 mmol/L (ref 22–32)
Calcium: 9 mg/dL (ref 8.9–10.3)
Chloride: 98 mmol/L (ref 98–111)
Creatinine, Ser: 0.6 mg/dL — ABNORMAL LOW (ref 0.61–1.24)
GFR, Estimated: >60 mL/min (ref >60)
Glucose, Bld: 102 mg/dL — ABNORMAL HIGH (ref 70–99)
Potassium: 3.3 mmol/L — ABNORMAL LOW (ref 3.5–5.1)
Sodium: 134 mmol/L — ABNORMAL LOW (ref 135–145)

## 2021-11-28 LAB — CBC
HCT: 48.1 % (ref 39.0–52.0)
Hemoglobin: 16 g/dL (ref 13.0–17.0)
MCH: 29.3 pg (ref 26.0–34.0)
MCHC: 33.3 g/dL (ref 30.0–36.0)
MCV: 88.1 fL (ref 80.0–100.0)
Platelets: 350 10*3/uL (ref 150–400)
RBC: 5.46 MIL/uL (ref 4.22–5.81)
RDW: 14.8 % (ref 11.5–15.5)
WBC: 6.6 10*3/uL (ref 4.0–10.5)
nRBC: 0 % (ref 0.0–0.2)

## 2021-11-28 LAB — SURGICAL PCR SCREEN
MRSA, PCR: NEGATIVE
Staphylococcus aureus: NEGATIVE

## 2021-11-29 NOTE — Progress Notes (Signed)
Anesthesia Chart Review   Case: [id number] Date/Time: 12/01/21 0945   Procedure: LEFT TOTAL KNEE ARTHROPLASTY (Left: Knee)   Anesthesia type: Spinal   Pre-op diagnosis: osteoarthritis left knee   Location[facility] ROOM 09 / [facility]   Surgeons: Mcarthur Rossetti, MD       DISCUSSION:76 y.o. never smoker with h/o HTN, CAD (DES 2015), moderate aortic stenosis, BPH, left knee OA scheduled for above procedure 12/01/2021 with Dr. Jean Rosenthal.   Per previous anesthesia note, "Wears med alert necklace stating pt is difficult airway and intubation, and allergic to succinylcholine"   Pt last seen by cardiology 10/23/2021.   S/p right knee replacement. Prior to this he was evaluated by cardiology.  Per 07/20/2021 note, "Pt seen by cardiology today.  Per OV note, "He is easily getting greater than 4 METS of activity.  He is exertional shortness of breath is likely due to deconditioning.  He thinks he is limiting himself due to knee problem.  Given past medical history and time since last visit, based on ACC/AHA guidelines, Jason Blackwell would be at acceptable risk for the planned procedure without further cardiovascular testing."  Anticipate pt can proceed with planned procedure barring acute status change.   VS: BP (!) 148/71   Pulse 64   Temp 36.7 C (Oral)   Resp 14   Ht '5\' 10"'$  (1.778 m)   Wt 103 kg   SpO2 99%   BMI 32.57 kg/m   PROVIDERS: [place], [facility]  Cardiologist - Daneen Schick, MD  LABS: Labs reviewed: Acceptable for surgery. (all labs ordered are listed, but only abnormal results are displayed)  Labs Reviewed  BASIC METABOLIC PANEL - Abnormal; Notable for the following components:      Result Value   Sodium 134 (*)    Potassium 3.3 (*)    Glucose, Bld 102 (*)    Creatinine, Ser 0.60 (*)    All other components within normal limits  SURGICAL PCR SCREEN  CBC  TYPE AND SCREEN     IMAGES:   EKG:   CV: Echo 08/23/2021  1. Left  ventricular ejection fraction, by estimation, is 60 to 65%. The  left ventricle has normal function. The left ventricle has no regional  wall motion abnormalities. There is mild left ventricular hypertrophy.  Left ventricular diastolic parameters  are consistent with Grade I diastolic dysfunction (impaired relaxation).   2. Right ventricular systolic function is normal. The right ventricular  size is normal. Tricuspid regurgitation signal is inadequate for assessing  PA pressure.   3. Left atrial size was mildly dilated.   4. Right atrial size was mildly dilated.   5. The mitral valve is normal in structure. No evidence of mitral valve  regurgitation. No evidence of mitral stenosis. Moderate mitral annular  calcification.   6. The aortic valve was not well visualized. There is moderate  calcification of the aortic valve. Aortic valve regurgitation is not  visualized. Moderate aortic valve stenosis. Aortic valve area, by VTI  measures 1.18 cm. Aortic valve mean gradient  measures 27.0 mmHg.   7. The inferior vena cava is normal in size with greater than 50%  respiratory variability, suggesting right atrial pressure of 3 mmHg.  Past Medical History:  Diagnosis Date   Arthritis    right knee   BPH (benign prostatic hypertrophy)    TURP done   Cancer [facility])    skin   Colon adenoma    Complication of anesthesia  Respiratory issues with Succinylcholine   Coronary artery disease    3 stent   Difficult intubation    Ejection fraction    Erectile dysfunction    Fecal incontinence    GERD (gastroesophageal reflux disease)    Heart murmur 3212   LVH, diastolic dysfunction, aortic sclerosis   Hemorrhoids    History of kidney stones    Hyperlipidemia    Hypertension    Insomnia     Past Surgical History:  Procedure Laterality Date   CATARACT EXTRACTION W/PHACO Right 09/24/2018   Procedure: CATARACT EXTRACTION PHACO AND INTRAOCULAR LENS PLACEMENT (IOC)COMPLICATED RIGHT;   Surgeon: Leandrew Koyanagi, MD;  Location: [place];  Service: Ophthalmology;  Laterality: Right;  MAYLUGIN   CATARACT EXTRACTION W/PHACO Left 10/15/2018   Procedure: CATARACT EXTRACTION PHACO AND INTRAOCULAR LENS PLACEMENT (IOC) RIGHT  01:16.3  17.9%  13.79;  Surgeon: Leandrew Koyanagi, MD;  Location: [place];  Service: Ophthalmology;  Laterality: Left;   LEFT HEART CATHETERIZATION WITH CORONARY ANGIOGRAM N/A 12/31/2013   Procedure: LEFT HEART CATHETERIZATION WITH CORONARY ANGIOGRAM;  Surgeon: Sinclair Grooms, MD;  Location: [facility] CATH LAB;  Service: Cardiovascular;  Laterality: N/A; Ostial and mid LAD stenting   PROSTATE BIOPSY  ~ [place] Right 01/16/2011   precancerous lesion from arm   TONSILLECTOMY     as a child   TOTAL KNEE ARTHROPLASTY Right 07/21/2021   Procedure: RIGHT TOTAL KNEE ARTHROPLASTY;  Surgeon: Mcarthur Rossetti, MD;  [facility];  Service: Orthopedics;  Laterality: Right;   TRANSURETHRAL RESECTION OF PROSTATE N/A 07/09/2019   Procedure: TRANSURETHRAL RESECTION OF THE PROSTATE (TURP);  Surgeon: Franchot Gallo, MD;  [facility];  Service: Urology;  Laterality: N/A;    MEDICATIONS:  acetaminophen (TYLENOL) 500 MG tablet   amLODipine (NORVASC) 10 MG tablet   aspirin 81 MG chewable tablet   chlorthalidone (HYGROTON) 25 MG tablet   Evolocumab (REPATHA SURECLICK) 248 MG/ML SOAJ   famotidine (PEPCID) 20 MG tablet   ferrous sulfate 325 (65 FE) MG tablet   hydrocortisone cream 1 %   methocarbamol (ROBAXIN) 500 MG tablet   omeprazole (PRILOSEC) 40 MG capsule   ondansetron (ZOFRAN) 4 MG tablet   OVER THE COUNTER MEDICATION   OVER THE COUNTER MEDICATION   OVER THE COUNTER MEDICATION   oxyCODONE (OXY IR/ROXICODONE) 5 MG immediate release tablet   tadalafil (CIALIS) 5 MG tablet   telmisartan (MICARDIS) 80 MG tablet   No current facility-administered medications for this encounter.     Jason Felix Ward,  PA-C [facility]Surgical Testing [phone]

## 2021-11-29 NOTE — Anesthesia Preprocedure Evaluation (Addendum)
Anesthesia Evaluation    Reviewed: Allergy & Precautions, H&P , Patient's Chart, lab work & pertinent test results  History of Anesthesia Complications (+) DIFFICULT AIRWAY and history of anesthetic complications  Airway Mallampati: II  TM Distance: <3 FB Neck ROM: Full    Dental no notable dental hx.    Pulmonary neg pulmonary ROS   Pulmonary exam normal breath sounds clear to auscultation       Cardiovascular Exercise Tolerance: Good hypertension, + CAD  negative cardio ROS Normal cardiovascular exam+ Valvular Problems/Murmurs  Rhythm:Regular Rate:Normal  ECHO  1. Left ventricular ejection fraction, by estimation, is 60 to 65%. The left ventricle has normal function. The left ventricle has no regional wall motion abnormalities. There is mild left ventricular hypertrophy. Left ventricular diastolic parameters are consistent with Grade I diastolic dysfunction (impaired relaxation).  2. Right ventricular systolic function is normal. The right ventricular size is normal. Tricuspid regurgitation signal is inadequate for assessing PA pressure.  3. Left atrial size was mildly dilated.  4. Right atrial size was mildly dilated.  5. The mitral valve is normal in structure. No evidence of mitral valve regurgitation. No evidence of mitral stenosis. Moderate mitral annular calcification.  6. The aortic valve was not well visualized. There is moderate calcification of the aortic valve. Aortic valve regurgitation is not visualized. Moderate aortic valve stenosis. Aortic valve area, by VTI measures 1.18 cm. Aortic valve mean gradient measures 27.0 mmHg.  7. The inferior vena cava is normal in size with greater than 50% respiratory variability, suggesting right atrial pressure of 3 mmHg.    Neuro/Psych negative neurological ROS  negative psych ROS   GI/Hepatic negative GI ROS, Neg liver ROS,GERD  ,,  Endo/Other  negative endocrine ROS     Renal/GU negative Renal ROS  negative genitourinary   Musculoskeletal   Abdominal   Peds  Hematology negative hematology ROS (+)   Anesthesia Other Findings   Reproductive/Obstetrics negative OB ROS                             Anesthesia Physical Anesthesia Plan  ASA: 3  Anesthesia Plan: Spinal, Regional and MAC   Post-op Pain Management: Minimal or no pain anticipated   Induction: Intravenous  PONV Risk Score and Plan: 2 and Propofol infusion, Treatment may vary due to age or medical condition and Midazolam  Airway Management Planned: Natural Airway and Simple Face Mask  Additional Equipment: None  Intra-op Plan:   Post-operative Plan:   Informed Consent: I have reviewed the patients History and Physical, chart, labs and discussed the procedure including the risks, benefits and alternatives for the proposed anesthesia with the patient or authorized representative who has indicated his/her understanding and acceptance.       Plan Discussed with: Anesthesiologist  Anesthesia Plan Comments: (See PAT note 11/28/2021 DISCUSSION:76 y.o. never smoker with h/o HTN, CAD (DES 2015), moderate aortic stenosis, BPH, left knee OA scheduled for above procedure 12/01/2021 with Dr. Jean Rosenthal.    Per previous anesthesia note, "Wears med alert necklace stating pt is difficult airway and intubation, and allergic to succinylcholine"    Pt last seen by cardiology 10/23/2021.    S/p right knee replacement. Prior to this he was evaluated by cardiology.  Per 07/20/2021 note, "Pt seen by cardiology today.  Per OV note, "He is easily getting greater than 4 METS of activity.  He is exertional shortness of breath is likely due to deconditioning.  He thinks he is limiting himself due to knee problem.  Given past medical history and time since last visit, based on ACC/AHA guidelines, Jason Blackwell would be at acceptable risk for the planned procedure  without further cardiovascular testing." )       Anesthesia Quick Evaluation

## 2021-11-30 ENCOUNTER — Ambulatory Visit: Payer: PPO | Admitting: Orthopaedic Surgery

## 2021-11-30 ENCOUNTER — Ambulatory Visit: Payer: PPO | Admitting: Orthopedic Surgery

## 2021-11-30 ENCOUNTER — Telehealth: Payer: Self-pay | Admitting: *Deleted

## 2021-11-30 NOTE — Telephone Encounter (Signed)
Ortho bundle Pre-op call completed. 

## 2021-11-30 NOTE — H&P (Signed)
TOTAL KNEE ADMISSION H&P  Patient is being admitted for left total knee arthroplasty.  Subjective:  Chief Complaint:left knee pain.  HPI: Jason Blackwell, 76 y.o. male, has a history of pain and functional disability in the left knee due to arthritis and has failed non-surgical conservative treatments for greater than 12 weeks to includeNSAID's and/or analgesics, corticosteriod injections, flexibility and strengthening excercises, use of assistive devices, and activity modification.  Onset of symptoms was abrupt, starting several months ago with rapidlly worsening course since that time. The patient noted no past surgery on the left knee(s).  Patient currently rates pain in the left knee(s) at 10 out of 10 with activity. Patient has night pain, worsening of pain with activity and weight bearing, pain that interferes with activities of daily living, pain with passive range of motion, crepitus, and joint swelling.  Patient has evidence of subchondral sclerosis, joint space narrowing, and subchondral edema as well as an insufficiency fracture and meniscal tearing  by imaging studies. There is no active infection.  Patient Active Problem List   Diagnosis Date Noted   Unilateral primary osteoarthritis, left knee 10/30/2021   Status post total right knee replacement 07/21/2021   Unilateral primary osteoarthritis, right knee 05/15/2021   Chronic cough 04/14/2021   Malignant melanoma of scalp ([place]) 11/11/2019   Enlarged prostate with urinary obstruction 07/09/2019   Lesion of external ear, left 08/23/2015   Hemorrhoids 10/11/2014   CAD (coronary artery disease), native coronary artery 01/17/2014   Erectile dysfunction 01/17/2014   Allergy to statin medication 12/30/2013   Elevated PSA 12/30/2013   Aortic stenosis 12/30/2013   Ejection fraction    Hyperlipidemia 07/29/2006   Essential hypertension 07/29/2006   BENIGN PROSTATIC HYPERTROPHY 07/29/2006   NEPHROLITHIASIS, HX OF 07/29/2006   Past  Medical History:  Diagnosis Date   Arthritis    right knee   BPH (benign prostatic hypertrophy)    TURP done   Cancer ([place])    skin   Colon adenoma    Complication of anesthesia    Respiratory issues with Succinylcholine   Coronary artery disease    3 stent   Difficult intubation    Ejection fraction    Erectile dysfunction    Fecal incontinence    GERD (gastroesophageal reflux disease)    Heart murmur 6160   LVH, diastolic dysfunction, aortic sclerosis   Hemorrhoids    History of kidney stones    Hyperlipidemia    Hypertension    Insomnia     Past Surgical History:  Procedure Laterality Date   CATARACT EXTRACTION W/PHACO Right 09/24/2018   Procedure: CATARACT EXTRACTION PHACO AND INTRAOCULAR LENS PLACEMENT (IOC)COMPLICATED RIGHT;  Surgeon: Leandrew Koyanagi, MD;  Location: [place];  Service: Ophthalmology;  Laterality: Right;  MAYLUGIN   CATARACT EXTRACTION W/PHACO Left 10/15/2018   Procedure: CATARACT EXTRACTION PHACO AND INTRAOCULAR LENS PLACEMENT (IOC) RIGHT  01:16.3  17.9%  13.79;  Surgeon: Leandrew Koyanagi, MD;  Location: [place];  Service: Ophthalmology;  Laterality: Left;   LEFT HEART CATHETERIZATION WITH CORONARY ANGIOGRAM N/A 12/31/2013   Procedure: LEFT HEART CATHETERIZATION WITH CORONARY ANGIOGRAM;  Surgeon: Sinclair Grooms, MD;  Location: [facility] CATH LAB;  Service: Cardiovascular;  Laterality: N/A; Ostial and mid LAD stenting   PROSTATE BIOPSY  ~ [place] Right 01/16/2011   precancerous lesion from arm   TONSILLECTOMY     as a child   TOTAL KNEE ARTHROPLASTY Right 07/21/2021   Procedure: RIGHT TOTAL KNEE  ARTHROPLASTY;  Surgeon: Mcarthur Rossetti, MD;  Location: [facility];  Service: Orthopedics;  Laterality: Right;   TRANSURETHRAL RESECTION OF PROSTATE N/A 07/09/2019   Procedure: TRANSURETHRAL RESECTION OF THE PROSTATE (TURP);  Surgeon: Franchot Gallo, MD;  Location: [facility];  Service: Urology;  Laterality: N/A;     No current facility-administered medications for this encounter.   Current Outpatient Medications  Medication Sig Dispense Refill Last Dose   acetaminophen (TYLENOL) 500 MG tablet Take 1,000 mg by mouth 2 (two) times daily.      amLODipine (NORVASC) 10 MG tablet Take 10 mg by mouth daily.      aspirin 81 MG chewable tablet Chew 1 tablet (81 mg total) by mouth 2 (two) times daily. (Patient taking differently: Chew 81 mg by mouth daily.) 30 tablet 0    chlorthalidone (HYGROTON) 25 MG tablet TAKE 1 TABLET (25 MG TOTAL) BY MOUTH DAILY. 90 tablet 0    Evolocumab (REPATHA SURECLICK) 962 MG/ML SOAJ INJECT 140 MG INTO THE SKIN EVERY 14 (FOURTEEN) DAYS. 6 mL 3    famotidine (PEPCID) 20 MG tablet Take 20 mg by mouth every evening.      ferrous sulfate 325 (65 FE) MG tablet Take 325 mg by mouth every other day.      hydrocortisone cream 1 % Apply 1 Application topically daily as needed for itching.      methocarbamol (ROBAXIN) 500 MG tablet Take 1 tablet (500 mg total) by mouth every 6 (six) hours as needed for muscle spasms. 40 tablet 0    omeprazole (PRILOSEC) 40 MG capsule Take 40 mg by mouth in the morning and at bedtime.      OVER THE COUNTER MEDICATION Take 3 capsules by mouth daily. Balance of nature veggies      OVER THE COUNTER MEDICATION Take 3 capsules by mouth daily. Balance of nature fruits      OVER THE COUNTER MEDICATION Take 2 capsules by mouth at bedtime. Relaxium sleep      oxyCODONE (OXY IR/ROXICODONE) 5 MG immediate release tablet Take 1 tablet (5 mg total) by mouth every 6 (six) hours as needed for moderate pain (pain score 4-6). 30 tablet 0    tadalafil (CIALIS) 5 MG tablet Take 5 mg by mouth daily.      telmisartan (MICARDIS) 80 MG tablet Take 80 mg by mouth daily.      ondansetron (ZOFRAN) 4 MG tablet Take 1 tablet (4 mg total) by mouth every 8 (eight) hours as needed for nausea or vomiting. (Patient not taking: Reported on 11/23/2021) 20 tablet 0 Not Taking   Allergies   Allergen Reactions   Latex     redness   Statins     myalgia   Succinylcholine Chloride Other (See Comments)    Respiratory problems   Sulfa Antibiotics Itching and Rash   Sulfamethoxazole Itching          Social History   Tobacco Use   Smoking status: Never   Smokeless tobacco: Never  Substance Use Topics   Alcohol use: Yes    Alcohol/week: 28.0 standard drinks of alcohol    Types: 28 Glasses of wine per week    Comment: occas.    Family History  Problem Relation Age of Onset   Heart disease Mother    Heart attack Father    Diabetes Sister    Cancer - Colon Maternal Uncle      Review of Systems  Musculoskeletal:  Positive for gait problem and joint swelling.  All other systems reviewed and are negative.   Objective:  Physical Exam Vitals reviewed.  Constitutional:      Appearance: Normal appearance.  HENT:     Head: Normocephalic and atraumatic.  Eyes:     Extraocular Movements: Extraocular movements intact.     Pupils: Pupils are equal, round, and reactive to light.  Cardiovascular:     Rate and Rhythm: Normal rate and regular rhythm.     Pulses: Normal pulses.  Pulmonary:     Effort: Pulmonary effort is normal.     Breath sounds: Normal breath sounds.  Abdominal:     Palpations: Abdomen is soft.  Musculoskeletal:     Cervical back: Normal range of motion and neck supple.     Left knee: Effusion, bony tenderness and crepitus present. Decreased range of motion. Tenderness present over the medial joint line and lateral joint line. Abnormal alignment.  Neurological:     Mental Status: He is alert and oriented to person, place, and time.  Psychiatric:        Behavior: Behavior normal.     Vital signs in last 24 hours:    Labs:   Estimated body mass index is 32.57 kg/m as calculated from the following:   Height as of 11/28/21: '5\' 10"'$  (1.778 m).   Weight as of 11/28/21: 103 kg.   Imaging Review Plain radiographs demonstrate severe  degenerative joint disease of the right knee(s). The overall alignment ismild varus. The bone quality appears to be good for age and reported activity level.      Assessment/Plan:  End stage arthritis, left knee   The patient history, physical examination, clinical judgment of the provider and imaging studies are consistent with end stage degenerative joint disease of the left knee(s) and total knee arthroplasty is deemed medically necessary. The treatment options including medical management, injection therapy arthroscopy and arthroplasty were discussed at length. The risks and benefits of total knee arthroplasty were presented and reviewed. The risks due to aseptic loosening, infection, stiffness, patella tracking problems, thromboembolic complications and other imponderables were discussed. The patient acknowledged the explanation, agreed to proceed with the plan and consent was signed. Patient is being admitted for inpatient treatment for surgery, pain control, PT, OT, prophylactic antibiotics, VTE prophylaxis, progressive ambulation and ADL's and discharge planning. The patient is planning to be discharged home with home health services

## 2021-11-30 NOTE — Care Plan (Signed)
[facility] RNCM call to patient to discuss his upcoming Left total knee arthroplasty. He has had his other knee replacement done earlier this year and did very well from this. He is an Ortho bundle patient through [facility] and is agreeable to case management. He lives with his wife, who will be assisting at discharge. He has a RW and cane already. No other DME needed. Anticipate HHPT will be needed. Referral made to [facility] after choice provided. Reviewed all post op care instructions. Will continue to follow for needs.

## 2021-12-01 ENCOUNTER — Ambulatory Visit (HOSPITAL_BASED_OUTPATIENT_CLINIC_OR_DEPARTMENT_OTHER): Payer: PPO | Admitting: Physician Assistant

## 2021-12-01 ENCOUNTER — Encounter (HOSPITAL_COMMUNITY): Payer: Self-pay | Admitting: Orthopaedic Surgery

## 2021-12-01 ENCOUNTER — Other Ambulatory Visit: Payer: Self-pay

## 2021-12-01 ENCOUNTER — Ambulatory Visit (HOSPITAL_COMMUNITY): Payer: PPO | Admitting: Emergency Medicine

## 2021-12-01 ENCOUNTER — Encounter (HOSPITAL_COMMUNITY)
Admission: RE | Disposition: A | Discharge status: Home Health | Payer: Self-pay | Source: Ambulatory Visit | Attending: Orthopaedic Surgery

## 2021-12-01 ENCOUNTER — Observation Stay (HOSPITAL_COMMUNITY)
Admission: RE | Admit: 2021-12-01 | Discharge: 2021-12-02 | Disposition: A | Discharge status: Home Health | Payer: PPO | Source: Ambulatory Visit | Attending: Orthopaedic Surgery | Admitting: Orthopaedic Surgery

## 2021-12-01 ENCOUNTER — Other Ambulatory Visit: Payer: Self-pay | Admitting: *Deleted

## 2021-12-01 ENCOUNTER — Observation Stay (HOSPITAL_COMMUNITY): Payer: PPO

## 2021-12-01 DIAGNOSIS — Z85828 Personal history of other malignant neoplasm of skin: Secondary | ICD-10-CM | POA: Diagnosis not present

## 2021-12-01 DIAGNOSIS — I119 Hypertensive heart disease without heart failure: Secondary | ICD-10-CM

## 2021-12-01 DIAGNOSIS — Z7982 Long term (current) use of aspirin: Secondary | ICD-10-CM | POA: Diagnosis not present

## 2021-12-01 DIAGNOSIS — Z79899 Other long term (current) drug therapy: Secondary | ICD-10-CM | POA: Diagnosis not present

## 2021-12-01 DIAGNOSIS — I2510 Atherosclerotic heart disease of native coronary artery without angina pectoris: Secondary | ICD-10-CM | POA: Insufficient documentation

## 2021-12-01 DIAGNOSIS — Z471 Aftercare following joint replacement surgery: Secondary | ICD-10-CM | POA: Diagnosis not present

## 2021-12-01 DIAGNOSIS — Z91040 Latex allergy status: Secondary | ICD-10-CM | POA: Diagnosis not present

## 2021-12-01 DIAGNOSIS — I10 Essential (primary) hypertension: Secondary | ICD-10-CM | POA: Diagnosis not present

## 2021-12-01 DIAGNOSIS — G8918 Other acute postprocedural pain: Secondary | ICD-10-CM | POA: Diagnosis not present

## 2021-12-01 DIAGNOSIS — M1712 Unilateral primary osteoarthritis, left knee: Secondary | ICD-10-CM

## 2021-12-01 DIAGNOSIS — Z96652 Presence of left artificial knee joint: Secondary | ICD-10-CM | POA: Diagnosis not present

## 2021-12-01 DIAGNOSIS — Z96651 Presence of right artificial knee joint: Secondary | ICD-10-CM | POA: Insufficient documentation

## 2021-12-01 HISTORY — PX: TOTAL KNEE ARTHROPLASTY: SHX125

## 2021-12-01 LAB — TYPE AND SCREEN
ABO/RH(D): A POS
Antibody Screen: NEGATIVE

## 2021-12-01 SURGERY — ARTHROPLASTY, KNEE, TOTAL
Anesthesia: Monitor Anesthesia Care | Site: Knee | Laterality: Left

## 2021-12-01 MED ORDER — ONDANSETRON HCL 4 MG/2ML IJ SOLN
INTRAMUSCULAR | Status: DC | PRN
  Administered 2021-12-01: 4 mg via INTRAVENOUS

## 2021-12-01 MED ORDER — LACTATED RINGERS IV SOLN: INTRAVENOUS | Status: DC

## 2021-12-01 MED ORDER — MIDAZOLAM HCL 2 MG/2ML IJ SOLN
1.0000 mg | INTRAMUSCULAR | Status: DC
  Administered 2021-12-01: 2 mg via INTRAVENOUS
  Filled 2021-12-01: qty 2

## 2021-12-01 MED ORDER — POVIDONE-IODINE 10 % EX SWAB
2.0000 | Freq: Once | CUTANEOUS | Status: AC
  Administered 2021-12-01: 2 via TOPICAL

## 2021-12-01 MED ORDER — HYDROMORPHONE HCL 1 MG/ML IJ SOLN
0.5000 mg | INTRAMUSCULAR | Status: DC | PRN
  Administered 2021-12-01: 1 mg via INTRAVENOUS
  Filled 2021-12-01: qty 1

## 2021-12-01 MED ORDER — CEFAZOLIN SODIUM-DEXTROSE 2-4 GM/100ML-% IV SOLN
2.0000 g | INTRAVENOUS | Status: AC
  Administered 2021-12-01: 2 g via INTRAVENOUS
  Filled 2021-12-01: qty 100

## 2021-12-01 MED ORDER — ACETAMINOPHEN 325 MG PO TABS
325.0000 mg | ORAL_TABLET | Freq: Four times a day (QID) | ORAL | Status: DC | PRN
  Administered 2021-12-02: 650 mg via ORAL
  Filled 2021-12-01: qty 2

## 2021-12-01 MED ORDER — DOCUSATE SODIUM 100 MG PO CAPS
100.0000 mg | ORAL_CAPSULE | Freq: Two times a day (BID) | ORAL | Status: DC
  Administered 2021-12-01 – 2021-12-02 (×2): 100 mg via ORAL
  Filled 2021-12-01 (×2): qty 1

## 2021-12-01 MED ORDER — PHENYLEPHRINE HCL-NACL 20-0.9 MG/250ML-% IV SOLN
INTRAVENOUS | Status: DC | PRN
  Administered 2021-12-01: 25 ug/min via INTRAVENOUS

## 2021-12-01 MED ORDER — HYDROMORPHONE HCL 1 MG/ML IJ SOLN
0.2500 mg | INTRAMUSCULAR | Status: DC | PRN
  Administered 2021-12-01 (×2): 0.5 mg via INTRAVENOUS

## 2021-12-01 MED ORDER — 0.9 % SODIUM CHLORIDE (POUR BTL) OPTIME
TOPICAL | Status: DC | PRN
  Administered 2021-12-01: 1000 mL

## 2021-12-01 MED ORDER — FAMOTIDINE 20 MG PO TABS
20.0000 mg | ORAL_TABLET | Freq: Every evening | ORAL | Status: DC
  Administered 2021-12-01: 20 mg via ORAL
  Filled 2021-12-01: qty 1

## 2021-12-01 MED ORDER — PROPOFOL 500 MG/50ML IV EMUL
INTRAVENOUS | Status: AC
  Filled 2021-12-01: qty 50

## 2021-12-01 MED ORDER — OXYCODONE HCL 5 MG PO TABS
10.0000 mg | ORAL_TABLET | ORAL | Status: DC | PRN
  Administered 2021-12-01: 15 mg via ORAL
  Administered 2021-12-02: 10 mg via ORAL
  Administered 2021-12-02: 15 mg via ORAL
  Administered 2021-12-02: 10 mg via ORAL
  Filled 2021-12-01 (×3): qty 3

## 2021-12-01 MED ORDER — ORAL CARE MOUTH RINSE: 15.0000 mL | Freq: Once | OROMUCOSAL | Status: AC

## 2021-12-01 MED ORDER — FENTANYL CITRATE PF 50 MCG/ML IJ SOSY
PREFILLED_SYRINGE | INTRAMUSCULAR | Status: AC
  Filled 2021-12-01: qty 1

## 2021-12-01 MED ORDER — ONDANSETRON HCL 4 MG/2ML IJ SOLN: 4.0000 mg | Freq: Four times a day (QID) | INTRAMUSCULAR | Status: DC | PRN

## 2021-12-01 MED ORDER — IRBESARTAN 150 MG PO TABS
300.0000 mg | ORAL_TABLET | Freq: Every day | ORAL | Status: DC
  Filled 2021-12-01: qty 2

## 2021-12-01 MED ORDER — METHOCARBAMOL 500 MG IVPB - SIMPLE MED
INTRAVENOUS | Status: AC
  Filled 2021-12-01: qty 55

## 2021-12-01 MED ORDER — MENTHOL 3 MG MT LOZG: 1.0000 | LOZENGE | OROMUCOSAL | Status: DC | PRN

## 2021-12-01 MED ORDER — CEFAZOLIN SODIUM-DEXTROSE 1-4 GM/50ML-% IV SOLN
1.0000 g | Freq: Four times a day (QID) | INTRAVENOUS | Status: AC
  Administered 2021-12-01 (×2): 1 g via INTRAVENOUS
  Filled 2021-12-01 (×2): qty 50

## 2021-12-01 MED ORDER — FENTANYL CITRATE PF 50 MCG/ML IJ SOSY
25.0000 ug | PREFILLED_SYRINGE | INTRAMUSCULAR | Status: DC | PRN
  Administered 2021-12-01 (×3): 50 ug via INTRAVENOUS

## 2021-12-01 MED ORDER — METHOCARBAMOL 500 MG PO TABS
500.0000 mg | ORAL_TABLET | Freq: Four times a day (QID) | ORAL | Status: DC | PRN
  Administered 2021-12-01 – 2021-12-02 (×3): 500 mg via ORAL
  Filled 2021-12-01 (×3): qty 1

## 2021-12-01 MED ORDER — BUPIVACAINE IN DEXTROSE 0.75-8.25 % IT SOLN
INTRATHECAL | Status: DC | PRN
  Administered 2021-12-01: 1.8 mL via INTRATHECAL

## 2021-12-01 MED ORDER — PHENOL 1.4 % MT LIQD: 1.0000 | OROMUCOSAL | Status: DC | PRN

## 2021-12-01 MED ORDER — BUPIVACAINE-EPINEPHRINE (PF) 0.25% -1:200000 IJ SOLN
INTRAMUSCULAR | Status: AC
  Filled 2021-12-01: qty 30

## 2021-12-01 MED ORDER — PROPOFOL 500 MG/50ML IV EMUL
INTRAVENOUS | Status: DC | PRN
  Administered 2021-12-01: 40 ug/kg/min via INTRAVENOUS

## 2021-12-01 MED ORDER — ONDANSETRON HCL 4 MG PO TABS: 4.0000 mg | ORAL_TABLET | Freq: Four times a day (QID) | ORAL | Status: DC | PRN

## 2021-12-01 MED ORDER — BUPIVACAINE-EPINEPHRINE 0.25% -1:200000 IJ SOLN
INTRAMUSCULAR | Status: DC | PRN
  Administered 2021-12-01: 30 mL

## 2021-12-01 MED ORDER — ONDANSETRON HCL 4 MG/2ML IJ SOLN: 4.0000 mg | Freq: Once | INTRAMUSCULAR | Status: DC | PRN

## 2021-12-01 MED ORDER — LIDOCAINE HCL (PF) 2 % IJ SOLN
INTRAMUSCULAR | Status: AC
  Filled 2021-12-01: qty 5

## 2021-12-01 MED ORDER — TRANEXAMIC ACID-NACL 1000-0.7 MG/100ML-% IV SOLN
1000.0000 mg | INTRAVENOUS | Status: AC
  Administered 2021-12-01: 1000 mg via INTRAVENOUS
  Filled 2021-12-01: qty 100

## 2021-12-01 MED ORDER — STERILE WATER FOR IRRIGATION IR SOLN
Status: DC | PRN
  Administered 2021-12-01: 2000 mL

## 2021-12-01 MED ORDER — FENTANYL CITRATE PF 50 MCG/ML IJ SOSY
50.0000 ug | PREFILLED_SYRINGE | INTRAMUSCULAR | Status: DC
  Administered 2021-12-01: 100 ug via INTRAVENOUS
  Filled 2021-12-01: qty 2

## 2021-12-01 MED ORDER — CHLORHEXIDINE GLUCONATE 0.12 % MT SOLN
15.0000 mL | Freq: Once | OROMUCOSAL | Status: AC
  Administered 2021-12-01: 15 mL via OROMUCOSAL

## 2021-12-01 MED ORDER — OXYCODONE HCL 5 MG PO TABS: 5.0000 mg | ORAL_TABLET | Freq: Once | ORAL | Status: DC | PRN

## 2021-12-01 MED ORDER — OXYCODONE HCL 5 MG PO TABS
5.0000 mg | ORAL_TABLET | ORAL | Status: DC | PRN
  Administered 2021-12-01: 10 mg via ORAL
  Filled 2021-12-01 (×2): qty 2

## 2021-12-01 MED ORDER — MEPERIDINE HCL 50 MG/ML IJ SOLN: 6.2500 mg | INTRAMUSCULAR | Status: DC | PRN

## 2021-12-01 MED ORDER — DEXAMETHASONE SODIUM PHOSPHATE 10 MG/ML IJ SOLN
INTRAMUSCULAR | Status: DC | PRN
  Administered 2021-12-01: 8 mg via INTRAVENOUS

## 2021-12-01 MED ORDER — ACETAMINOPHEN 325 MG PO TABS: 325.0000 mg | ORAL_TABLET | ORAL | Status: DC | PRN

## 2021-12-01 MED ORDER — METHOCARBAMOL 500 MG IVPB - SIMPLE MED
500.0000 mg | Freq: Four times a day (QID) | INTRAVENOUS | Status: DC | PRN
  Administered 2021-12-01: 500 mg via INTRAVENOUS

## 2021-12-01 MED ORDER — AMLODIPINE BESYLATE 10 MG PO TABS
10.0000 mg | ORAL_TABLET | Freq: Every day | ORAL | Status: DC
  Administered 2021-12-02: 10 mg via ORAL
  Filled 2021-12-01: qty 1

## 2021-12-01 MED ORDER — ROPIVACAINE HCL 5 MG/ML IJ SOLN
INTRAMUSCULAR | Status: DC | PRN
  Administered 2021-12-01: 5 mL via PERINEURAL

## 2021-12-01 MED ORDER — SODIUM CHLORIDE 0.9 % IR SOLN
Status: DC | PRN
  Administered 2021-12-01: 1000 mL

## 2021-12-01 MED ORDER — ASPIRIN 81 MG PO CHEW
81.0000 mg | CHEWABLE_TABLET | Freq: Two times a day (BID) | ORAL | Status: DC
  Administered 2021-12-01 – 2021-12-02 (×2): 81 mg via ORAL
  Filled 2021-12-01 (×2): qty 1

## 2021-12-01 MED ORDER — FENTANYL CITRATE PF 50 MCG/ML IJ SOSY
PREFILLED_SYRINGE | INTRAMUSCULAR | Status: AC
  Filled 2021-12-01: qty 2

## 2021-12-01 MED ORDER — CHLORTHALIDONE 25 MG PO TABS
25.0000 mg | ORAL_TABLET | Freq: Every day | ORAL | Status: DC
  Administered 2021-12-02: 25 mg via ORAL
  Filled 2021-12-01: qty 1

## 2021-12-01 MED ORDER — ACETAMINOPHEN 160 MG/5ML PO SOLN: 325.0000 mg | ORAL | Status: DC | PRN

## 2021-12-01 MED ORDER — PANTOPRAZOLE SODIUM 40 MG PO TBEC
40.0000 mg | DELAYED_RELEASE_TABLET | Freq: Every day | ORAL | Status: DC
  Administered 2021-12-02: 40 mg via ORAL
  Filled 2021-12-01: qty 1

## 2021-12-01 MED ORDER — METOCLOPRAMIDE HCL 5 MG PO TABS: 5.0000 mg | ORAL_TABLET | Freq: Three times a day (TID) | ORAL | Status: DC | PRN

## 2021-12-01 MED ORDER — OXYCODONE HCL 5 MG/5ML PO SOLN: 5.0000 mg | Freq: Once | ORAL | Status: DC | PRN

## 2021-12-01 MED ORDER — DEXAMETHASONE SODIUM PHOSPHATE 10 MG/ML IJ SOLN
INTRAMUSCULAR | Status: DC | PRN
  Administered 2021-12-01: 10 mg

## 2021-12-01 MED ORDER — DIPHENHYDRAMINE HCL 12.5 MG/5ML PO ELIX: 12.5000 mg | ORAL_SOLUTION | ORAL | Status: DC | PRN

## 2021-12-01 MED ORDER — FERROUS SULFATE 325 (65 FE) MG PO TABS: 325.0000 mg | ORAL_TABLET | ORAL | Status: DC

## 2021-12-01 MED ORDER — HYDROMORPHONE HCL 1 MG/ML IJ SOLN
INTRAMUSCULAR | Status: AC
  Filled 2021-12-01: qty 1

## 2021-12-01 MED ORDER — ALUM & MAG HYDROXIDE-SIMETH 200-200-20 MG/5ML PO SUSP
30.0000 mL | ORAL | Status: DC | PRN
  Administered 2021-12-01: 30 mL via ORAL

## 2021-12-01 MED ORDER — ONDANSETRON HCL 4 MG/2ML IJ SOLN
INTRAMUSCULAR | Status: AC
  Filled 2021-12-01: qty 2

## 2021-12-01 MED ORDER — METOCLOPRAMIDE HCL 5 MG/ML IJ SOLN: 5.0000 mg | Freq: Three times a day (TID) | INTRAMUSCULAR | Status: DC | PRN

## 2021-12-01 MED ORDER — DEXAMETHASONE SODIUM PHOSPHATE 10 MG/ML IJ SOLN
INTRAMUSCULAR | Status: AC
  Filled 2021-12-01: qty 1

## 2021-12-01 SURGICAL SUPPLY — 57 items
APL SKNCLS STERI-STRIP NONHPOA (GAUZE/BANDAGES/DRESSINGS)
BAG COUNTER SPONGE SURGICOUNT (BAG) IMPLANT
BAG SPEC THK2 15X12 ZIP CLS (MISCELLANEOUS) ×1
BAG SPNG CNTER NS LX DISP (BAG)
BAG ZIPLOCK 12X15 (MISCELLANEOUS) ×1 IMPLANT
BENZOIN TINCTURE PRP APPL 2/3 (GAUZE/BANDAGES/DRESSINGS) IMPLANT
BLADE SAG 18X100X1.27 (BLADE) ×1 IMPLANT
BLADE SURG SZ10 CARB STEEL (BLADE) ×2 IMPLANT
BNDG ELASTIC 6X5.8 VLCR STR LF (GAUZE/BANDAGES/DRESSINGS) ×2 IMPLANT
BOWL SMART MIX CTS (DISPOSABLE) IMPLANT
BSPLAT TIB 5D F CMNT KN LT (Knees) ×1 IMPLANT
CEMENT BONE R 1X40 (Cement) IMPLANT
COOLER ICEMAN CLASSIC (MISCELLANEOUS) ×1 IMPLANT
COVER SURGICAL LIGHT HANDLE (MISCELLANEOUS) ×1 IMPLANT
CUFF TOURN SGL QUICK 34 (TOURNIQUET CUFF) ×1
CUFF TRNQT CYL 34X4.125X (TOURNIQUET CUFF) ×1 IMPLANT
DRAPE INCISE IOBAN 66X45 STRL (DRAPES) ×1 IMPLANT
DRAPE U-SHAPE 47X51 STRL (DRAPES) ×1 IMPLANT
DURAPREP 26ML APPLICATOR (WOUND CARE) ×1 IMPLANT
ELECT BLADE TIP CTD 4 INCH (ELECTRODE) ×1 IMPLANT
ELECT REM PT RETURN 15FT ADLT (MISCELLANEOUS) ×1 IMPLANT
FEMORAL KNEE COMP SZ 8 STND LT (Knees) ×1 IMPLANT
FEMORAL KNEE COMP SZ 8STD LT (Knees) IMPLANT
GAUZE PAD ABD 8X10 STRL (GAUZE/BANDAGES/DRESSINGS) ×2 IMPLANT
GAUZE SPONGE 4X4 12PLY STRL (GAUZE/BANDAGES/DRESSINGS) ×1 IMPLANT
GAUZE XEROFORM 1X8 LF (GAUZE/BANDAGES/DRESSINGS) IMPLANT
GLOVE BIO SURGEON STRL SZ7.5 (GLOVE) ×1 IMPLANT
GLOVE BIOGEL PI IND STRL 8 (GLOVE) ×2 IMPLANT
GLOVE ECLIPSE 8.0 STRL XLNG CF (GLOVE) ×1 IMPLANT
GOWN STRL REUS W/ TWL XL LVL3 (GOWN DISPOSABLE) ×2 IMPLANT
GOWN STRL REUS W/TWL XL LVL3 (GOWN DISPOSABLE) ×2
HANDPIECE INTERPULSE COAX TIP (DISPOSABLE) ×1
HDLS TROCR DRIL PIN KNEE 75 (PIN) ×1
IMMOBILIZER KNEE 20 (SOFTGOODS) ×1
IMMOBILIZER KNEE 20 THIGH 36 (SOFTGOODS) ×1 IMPLANT
KIT TURNOVER KIT A (KITS) IMPLANT
LINER TIB FXD PS EF/8-11 16 LT (Liner) IMPLANT
NS IRRIG 1000ML POUR BTL (IV SOLUTION) ×1 IMPLANT
PACK TOTAL KNEE CUSTOM (KITS) ×1 IMPLANT
PAD COLD SHLDR WRAP-ON (PAD) ×1 IMPLANT
PADDING CAST COTTON 6X4 STRL (CAST SUPPLIES) ×2 IMPLANT
PIN DRILL HDLS TROCAR 75 4PK (PIN) IMPLANT
PROTECTOR NERVE ULNAR (MISCELLANEOUS) ×1 IMPLANT
SCREW FEMALE HEX FIX 25X2.5 (ORTHOPEDIC DISPOSABLE SUPPLIES) IMPLANT
SET HNDPC FAN SPRY TIP SCT (DISPOSABLE) ×1 IMPLANT
SET PAD KNEE POSITIONER (MISCELLANEOUS) ×1 IMPLANT
STAPLER VISISTAT 35W (STAPLE) IMPLANT
STEM POLY PAT PLY 35M KNEE (Knees) IMPLANT
STEM TIBIA 5 DEG SZ F L KNEE (Knees) IMPLANT
STRIP CLOSURE SKIN 1/2X4 (GAUZE/BANDAGES/DRESSINGS) IMPLANT
SUT VIC AB 0 CT1 27 (SUTURE) ×1
SUT VIC AB 0 CT1 27XBRD ANTBC (SUTURE) ×1 IMPLANT
SUT VIC AB 1 CT1 36 (SUTURE) ×2 IMPLANT
SUT VIC AB 2-0 CT1 27 (SUTURE) ×2
SUT VIC AB 2-0 CT1 TAPERPNT 27 (SUTURE) ×2 IMPLANT
TIBIA STEM 5 DEG SZ F L KNEE (Knees) ×1 IMPLANT
WATER STERILE IRR 1000ML POUR (IV SOLUTION) ×2 IMPLANT

## 2021-12-01 NOTE — Care Plan (Signed)
Ortho Bundle Case Management Note  Patient Details  Name: Jason Blackwell MRN: [id number] Date of Birth: [birth year]   Concepcion Living RNCM call to patient to discuss his upcoming Left total knee arthroplasty on 12/01/21 with Dr. Ninfa Linden. He has had his other knee replacement done earlier this year and did very well from this. He is an Ortho bundle patient through [facility] and is agreeable to case management. He lives with his wife, who will be assisting at discharge. He has a RW and cane already. No other DME needed. Anticipate HHPT will be needed. Referral made to [facility] after choice provided. Reviewed all post op care instructions. Will continue to follow for needs.                   DME Arranged:   (Has RW already) DME Agency:     HH Arranged:  PT HH Agency:  [facility]  Additional Comments: Please contact me with any questions of if this plan should need to change.  Jamse Arn, RN, BSN, [employer]  [phone] 12/01/2021, 2:22 PM

## 2021-12-01 NOTE — Anesthesia Procedure Notes (Signed)
Spinal  Patient location during procedure: OR Start time: 12/01/2021 10:07 AM End time: 12/01/2021 10:11 AM Reason for block: surgical anesthesia Staffing Anesthesiologist: Janeece Riggers, MD Performed by: Janeece Riggers, MD Authorized by: Janeece Riggers, MD   Preanesthetic Checklist Completed: patient identified, IV checked, site marked, risks and benefits discussed, surgical consent, monitors and equipment checked, pre-op evaluation and timeout performed Spinal Block Patient position: sitting Prep: DuraPrep Patient monitoring: heart rate, cardiac monitor, continuous pulse ox and blood pressure Approach: midline Location: L3-4 Injection technique: single-shot Needle Needle type: Sprotte  Needle gauge: 24 G Needle length: 9 cm Assessment Sensory level: T4 Events: CSF return

## 2021-12-01 NOTE — OR Nursing (Signed)
Attempted foley, unable to insert catheter. Aborted per MD instruction.

## 2021-12-01 NOTE — Interval H&P Note (Signed)
History and Physical Interval Note: The patient is here today for a left knee replacement to treat his left knee osteoarthritis.  There has been no acute or interval changes medical status.  See H&P.  The risks and benefits of surgery been explained in detail and informed consent is obtained.  The left operative knee has been marked.  12/01/2021 8:46 AM  Jason Blackwell  has presented today for surgery, with the diagnosis of osteoarthritis left knee.  The various methods of treatment have been discussed with the patient and family. After consideration of risks, benefits and other options for treatment, the patient has consented to  Procedure(s): LEFT TOTAL KNEE ARTHROPLASTY (Left) as a surgical intervention.  The patient's history has been reviewed, patient examined, no change in status, stable for surgery.  I have reviewed the patient's chart and labs.  Questions were answered to the patient's satisfaction.     Mcarthur Rossetti

## 2021-12-01 NOTE — Anesthesia Procedure Notes (Signed)
Procedure Name: LMA Insertion Date/Time: 12/01/2021 10:31 AM  Performed by: Victoriano Lain, CRNAPre-anesthesia Checklist: Emergency Drugs available, Suction available, Patient being monitored and Timeout performed Patient Re-evaluated:Patient Re-evaluated prior to induction Oxygen Delivery Method: Circle system utilized Preoxygenation: Pre-oxygenation with 100% oxygen Induction Type: IV induction LMA: LMA with gastric port inserted LMA Size: 4.0 Number of attempts: 1 Placement Confirmation: positive ETCO2 and breath sounds checked- equal and bilateral Tube secured with: Tape Dental Injury: Teeth and Oropharynx as per pre-operative assessment

## 2021-12-01 NOTE — Evaluation (Signed)
Physical Therapy Evaluation Patient Details Name: Jason Blackwell MRN: [id number] DOB: [birth year] Today's Date: 12/01/2021  History of Present Illness  Pt is a 76yo male presenting s/p L-TKA on 12/01/21. PMH BPH, CAD s/p cath, GERD, HLD, HTN, R-TKA 07/21/21.  Clinical Impression  Jason Blackwell is a 76 y.o. male POD 0 s/p L-TKA. Patient reports independence with mobility at baseline. Patient is now limited by functional impairments (see PT problem list below) and requires supervision for bed mobility and min guard for transfers; pt reporting mild dizziness but able to continue, see OH table below. Patient was able to ambulate 15 feet with RW and min guard level of assist. Patient instructed in exercise to facilitate ROM and circulation to manage edema. Provided incentive spirometer and with Vcs pt able to achieve 2543m. Patient will benefit from continued skilled PT interventions to address impairments and progress towards PLOF. Acute PT will follow to progress mobility and stair training in preparation for safe discharge home.     12/01/21 1500  Orthostatic Lying   BP- Lying 130/80  Pulse- Lying 90  Orthostatic Sitting  BP- Sitting (!) 121/107  Pulse- Sitting 85  Orthostatic Standing at 0 minutes  BP- Standing at 0 minutes 136/84  Pulse- Standing at 0 minutes 93  Orthostatic Standing at 3 minutes  BP- Standing at 3 minutes 131/69  Pulse- Standing at 3 minutes 86        Recommendations for follow up therapy are one component of a multi-disciplinary discharge planning process, led by the attending physician.  Recommendations may be updated based on patient status, additional functional criteria and insurance authorization.  Follow Up Recommendations Follow physician's recommendations for discharge plan and follow up therapies      Assistance Recommended at Discharge Frequent or constant Supervision/Assistance  Patient can return home with the following  A little help with  walking and/or transfers;A little help with bathing/dressing/bathroom;Assistance with cooking/housework;Assist for transportation;Help with stairs or ramp for entrance    Equipment Recommendations None recommended by PT  Recommendations for Other Services       Functional Status Assessment Patient has had a recent decline in their functional status and demonstrates the ability to make significant improvements in function in a reasonable and predictable amount of time.     Precautions / Restrictions Precautions Precautions: Fall;Knee Precaution Booklet Issued: No Precaution Comments: no pillow under the knee Required Braces or Orthoses: Knee Immobilizer - Left Knee Immobilizer - Left: On when out of bed or walking;Discontinue post op day 2 Restrictions Weight Bearing Restrictions: No LLE Weight Bearing: Weight bearing as tolerated      Mobility  Bed Mobility Overal bed mobility: Needs Assistance Bed Mobility: Supine to Sit     Supine to sit: HOB elevated, Supervision     General bed mobility comments: For safety only. Pt reporting dizziness, VSS.    Transfers Overall transfer level: Needs assistance Equipment used: Rolling walker (2 wheels) Transfers: Sit to/from Stand Sit to Stand: Min guard, From elevated surface           General transfer comment: for safety only, VCs for sequencing    Ambulation/Gait Ambulation/Gait assistance: Min guard, +2 safety/equipment Gait Distance (Feet): 15 Feet Assistive device: Rolling walker (2 wheels) Gait Pattern/deviations: Step-to pattern Gait velocity: decreased     General Gait Details: Pt ambulated with RW and min guard, no physical assist or overt LOB noted, +2 for recliner follow, VCs for sequencing  Stairs  Wheelchair Mobility    Modified Rankin (Stroke Patients Only)       Balance Overall balance assessment: Needs assistance Sitting-balance support: Feet supported, No upper extremity  supported Sitting balance-Leahy Scale: Good     Standing balance support: Reliant on assistive device for balance, During functional activity, Bilateral upper extremity supported Standing balance-Leahy Scale: Poor                               Pertinent Vitals/Pain Pain Assessment Pain Assessment: 0-10 Pain Score: 6  Pain Location: low back, L>R Pain Descriptors / Indicators: Discomfort, Grimacing Pain Intervention(s): Limited activity within patient's tolerance, Monitored during session, Repositioned, Ice applied    Home Living Family/patient expects to be discharged to:: Private residence Living Arrangements: Spouse/significant other Available Help at Discharge: Family;Available 24 hours/day Type of Home: Apartment Home Access: Level entry       Home Layout: One level Home Equipment: Conservation officer, nature (2 wheels)      Prior Function Prior Level of Function : Independent/Modified Independent             Mobility Comments: RW intermittently ADLs Comments: IND     Hand Dominance        Extremity/Trunk Assessment   Upper Extremity Assessment Upper Extremity Assessment: Overall WFL for tasks assessed    Lower Extremity Assessment Lower Extremity Assessment: LLE deficits/detail;RLE deficits/detail RLE Deficits / Details: MMT ank DF/PF 5/5 RLE Sensation: WNL LLE Deficits / Details: MMT ank DF/PF 5/5, no extensor lag LLE Sensation: WNL    Cervical / Trunk Assessment Cervical / Trunk Assessment: Kyphotic  Communication   Communication: No difficulties  Cognition Arousal/Alertness: Awake/alert Behavior During Therapy: WFL for tasks assessed/performed Overall Cognitive Status: Within Functional Limits for tasks assessed                                          General Comments General comments (skin integrity, edema, etc.): Wife Malachy Mood present    Exercises Total Joint Exercises Ankle Circles/Pumps: AROM, Both, 20 reps    Assessment/Plan    PT Assessment Patient needs continued PT services  PT Problem List Decreased strength;Decreased range of motion;Decreased activity tolerance;Decreased balance;Decreased mobility;Decreased coordination;Pain       PT Treatment Interventions DME instruction;Gait training;Stair training;Functional mobility training;Therapeutic activities;Therapeutic exercise;Balance training;Neuromuscular re-education;Patient/family education    PT Goals (Current goals can be found in the Care Plan section)  Acute Rehab PT Goals Patient Stated Goal: "I'd like to go home tomorrow" PT Goal Formulation: With patient Time For Goal Achievement: 12/08/21 Potential to Achieve Goals: Good    Frequency 7X/week     Co-evaluation               AM-PAC PT "6 Clicks" Mobility  Outcome Measure Help needed turning from your back to your side while in a flat bed without using bedrails?: None Help needed moving from lying on your back to sitting on the side of a flat bed without using bedrails?: A Little Help needed moving to and from a bed to a chair (including a wheelchair)?: A Little Help needed standing up from a chair using your arms (e.g., wheelchair or bedside chair)?: A Little Help needed to walk in hospital room?: A Little Help needed climbing 3-5 steps with a railing? : A Little 6 Click Score: 19    End of  Session Equipment Utilized During Treatment: Gait belt;Left knee immobilizer Activity Tolerance: Patient tolerated treatment well;No increased pain Patient left: in chair;with call bell/phone within reach;with chair alarm set;with family/visitor present;with SCD's reapplied Nurse Communication: Mobility status PT Visit Diagnosis: Pain;Difficulty in walking, not elsewhere classified (R26.2) Pain - Right/Left: Left Pain - part of body: Knee    Time: 9476-5465 PT Time Calculation (min) (ACUTE ONLY): 30 min   Charges:   PT Evaluation $PT Eval Low Complexity: 1 Low PT  Treatments $Gait Training: 8-22 mins        Coolidge Breeze, PT, DPT [facility] Office: [phone] Weekend pager: [phone]  Coolidge Breeze 12/01/2021, 3:48 PM

## 2021-12-01 NOTE — Anesthesia Procedure Notes (Signed)
Anesthesia Regional Block: Adductor canal block   Pre-Anesthetic Checklist: , timeout performed,  Correct Patient, Correct Site, Correct Laterality,  Correct Procedure, Correct Position, site marked,  Risks and benefits discussed,  Surgical consent,  Pre-op evaluation,  At surgeon's request and post-op pain management  Laterality: Left  Prep: chloraprep       Needles:  Injection technique: Single-shot  Needle Type: Echogenic Stimulator Needle     Needle Length: 5cm  Needle Gauge: 22     Additional Needles:   Procedures:,,,, ultrasound used (permanent image in chart),,    Narrative:  Start time: 12/01/2021 8:50 AM End time: 12/01/2021 9:01 AM Injection made incrementally with aspirations every 5 mL.  Performed by: Personally  Anesthesiologist: Janeece Riggers, MD  Additional Notes: Functioning IV was confirmed and monitors were applied.  A 34m 22ga Arrow echogenic stimulator needle was used. Sterile prep and drape,hand hygiene and sterile gloves were used. Ultrasound guidance: relevant anatomy identified, needle position confirmed, local anesthetic spread visualized around nerve(s)., vascular puncture avoided.  Image printed for medical record. Negative aspiration and negative test dose prior to incremental administration of local anesthetic. The patient tolerated the procedure well.

## 2021-12-01 NOTE — TOC Transition Note (Signed)
Transition of Care [facility]) - CM/SW Discharge Note   Patient Details  Name: Jason Blackwell MRN: [id number] Date of Birth: 1945/06/01  [facility] CM/SW Contact:  Lennart Pall, LCSW Phone Number: 12/01/2021, 2:55 PM   Clinical Narrative:    Met briefly with pt and wife and confirming he has needed DME at home.  HHPT prearragned with [facility].  No TOC needs.   Final next level of care: Home w Home Health Services Barriers to Discharge: No Barriers Identified   Patient Goals and CMS Choice Patient states their goals for this hospitalization and ongoing recovery are:: return home      Discharge Placement                       Discharge Plan and Services                                     Social Determinants of Health (SDOH) Interventions     Readmission Risk Interventions     No data to display

## 2021-12-01 NOTE — Transfer of Care (Signed)
Immediate Anesthesia Transfer of Care Note  Patient: Jason Blackwell  Procedure(s) Performed: LEFT TOTAL KNEE ARTHROPLASTY (Left: Knee)  Patient Location: PACU  Anesthesia Type:MAC and Spinal  Level of Consciousness: awake, alert , oriented, and patient cooperative  Airway & Oxygen Therapy: Patient Spontanous Breathing and Patient connected to face mask oxygen  Post-op Assessment: Report given to RN, Post -op Vital signs reviewed and stable, and Patient moving all extremities  Post vital signs: Reviewed and stable  Last Vitals:  Vitals Value Taken Time  BP 144/82 12/01/21 1157  Temp    Pulse 69 12/01/21 1200  Resp 19 12/01/21 1200  SpO2 100 % 12/01/21 1200  Vitals shown include unvalidated device data.  Last Pain:  Vitals:   12/01/21 0855  PainSc: 0-No pain         Complications: No notable events documented.

## 2021-12-01 NOTE — Anesthesia Postprocedure Evaluation (Signed)
Anesthesia Post Note  Patient: Jason Blackwell  Procedure(s) Performed: LEFT TOTAL KNEE ARTHROPLASTY (Left: Knee)     Patient location during evaluation: Nursing Unit Anesthesia Type: Regional, MAC and Spinal Level of consciousness: oriented and awake and alert Pain management: pain level controlled Vital Signs Assessment: post-procedure vital signs reviewed and stable Respiratory status: spontaneous breathing and respiratory function stable Cardiovascular status: blood pressure returned to baseline and stable Postop Assessment: no headache, no backache, no apparent nausea or vomiting and patient able to bend at knees Anesthetic complications: no   No notable events documented.  Last Vitals:  Vitals:   12/01/21 1245 12/01/21 1300  BP: 131/71 126/69  Pulse: 81 80  Resp: 18 13  Temp:  36.7 C  SpO2: 98% 98%    Last Pain:  Vitals:   12/01/21 1300  PainSc: 5                  Sabriah Hobbins

## 2021-12-01 NOTE — Op Note (Signed)
Operative Note  Date of operation: 12/01/2021 Preoperative diagnosis: Left knee osteoarthritis Postoperative diagnosis: Same  Procedure: Left cemented total knee arthroplasty  Implants: Biomet/Zimmer persona knee system with size 8 left standard femur, size F left tibial tray, 16 mm medial congruent left polythene insert, 35 mm patella button  Surgeon: Lind Guest. Ninfa Linden, MD Assistant: Benita Stabile, PA-C  Anesthesia: #1 attempted spinal, #2 General, #3 regional block, #4 local EBL: Less than 100 cc Tourniquet time: Less than 1 hour Antibiotics: 2 g IV Ancef Complications: None  Indications: The patient is a 76 year old gentleman with well-documented debilitating arthritis involving his left knee.  I have actually replaced his right knee in the last year.  At this point his left knee pain is daily and is detrimentally affecting his mobility, his quality of life and his actives daily living.  He has recurrent effusions of the knee and has tried and failed all all forms of conservative treatment.  He does wish proceed with a knee replacement left side.  Having had this done before he is fully aware of the risk and benefits of surgery.  Procedure description: After informed consent was obtained and the appropriate left knee was marked, anesthesia obtained a regional left lower extremity block in the holding room.  The patient was then brought to the operating room and set up on the operating table where spinal anesthesia was obtained.  He was laid in supine position on the operating table and a nonsterile tourniquet placed on his upper left thigh and his left thigh, knee, leg and ankle were prepped and draped with DuraPrep and sterile drapes including a sterile stockinette.  A timeout was called and he identified as correct patient correct left knee.  We then used Esmarch wrap out the leg and the tourniquet was inflated to 300 mm of pressure.  I then tested him for a pain standpoint and he was  definitely feeling pain so they had to convert him to general anesthesia.  We then made incision over the patella and carried this proximally distally.  Dissection was carried down to the knee joint and a medial parapatellar arthrotomy was carried out.  There was a large joint effusion encountered.  With the knee in a flexed position we removed remnants of the ACL and PCL as well as medial lateral meniscus.  There are osteophytes removed from all 3 compartments and significant cartilage wear found in the knee.  We then used the extramedullary cutting guide for making her proximal tibia cut correction varus and valgus and a 3 degree slope.  We made this cut to take 2 mm off the low side but it was deftly more aggressive Than I anticipated.  We then went to the femur and made our distal femoral cut based off an intramedullary guide setting this for a left knee at 5 degrees externally rotated and a 10 mm distal femoral cut.  We made the cut without difficulty and brought the knee back down full extension and he actually hyperextended with a 10 mm cut.  We then back to the femur and put a femoral sizing guide based off of the epicondylar axis.  Based off of this we chose a size 8 femur.  We put a 4-in-1 cutting block for a size 8 femur and made her anterior posterior cuts followed our chamfer cuts.  We then backed the tibia and chose a size F tibial tray for coverage over the tibial plateau for left knee.  Setting the rotation of  the tibial tubercle on the femur.  We did our keel punch and drill hole off of this.  We then trialed a size F left tibial tray followed by our size left 8 femur.  We went all the way up to a 60 mm thickness polythene medial congruent insert and asked what he felt was the best stability.  We then made a patella cut and drilled 3 holes for a size 35 patella button.  With all transportation neoplasm several cycles of motion and I did feel stable with good range of motion.  We then removed all  trial instrumentation from the knee and irrigate the knee with normal saline solution.  We then placed our Marcaine with epinephrine around the arthrotomy.  The cement was mixed in with the knee in a flexed position we cemented our size F left tibial tray followed by our size left 8 CR standard femur.  We placed our 16 mm medial congruent polyethylene insert and cemented our size 35 patella button.  The knee was held fully extended and compressed while the cement hardened.  Once it hardened the tourniquet was let down and hemostasis was obtained electrocautery.  We then closed the arthrotomy with interrupted #1 Vicryl suture followed by 0 Vicryl to close the deep tissue and 2-0 Vicryl to close the subcutaneous tissue.  The skin was closed with staples.  Well-padded sterile dressings applied.  The patient was awakened, extubated and taken recovery in stable addition with all final counts being correct and no complications noted.  Benita Stabile, PA-C did assist the entire case and beginning to end and his assistance was medically necessary and helpful for retracting soft tissues and helping guide implant placement as well as a layered closure of the wound.

## 2021-12-02 DIAGNOSIS — M1712 Unilateral primary osteoarthritis, left knee: Secondary | ICD-10-CM | POA: Diagnosis not present

## 2021-12-02 LAB — CBC
HCT: 41.3 % (ref 39.0–52.0)
Hemoglobin: 14.1 g/dL (ref 13.0–17.0)
MCH: 29.9 pg (ref 26.0–34.0)
MCHC: 34.1 g/dL (ref 30.0–36.0)
MCV: 87.5 fL (ref 80.0–100.0)
Platelets: 320 10*3/uL (ref 150–400)
RBC: 4.72 MIL/uL (ref 4.22–5.81)
RDW: 14.6 % (ref 11.5–15.5)
WBC: 14.9 10*3/uL — ABNORMAL HIGH (ref 4.0–10.5)
nRBC: 0 % (ref 0.0–0.2)

## 2021-12-02 LAB — BASIC METABOLIC PANEL
Anion gap: 8 (ref 5–15)
BUN: 12 mg/dL (ref 8–23)
CO2: 26 mmol/L (ref 22–32)
Calcium: 8.5 mg/dL — ABNORMAL LOW (ref 8.9–10.3)
Chloride: 98 mmol/L (ref 98–111)
Creatinine, Ser: 0.6 mg/dL — ABNORMAL LOW (ref 0.61–1.24)
GFR, Estimated: >60 mL/min (ref >60)
Glucose, Bld: 180 mg/dL — ABNORMAL HIGH (ref 70–99)
Potassium: 3.1 mmol/L — ABNORMAL LOW (ref 3.5–5.1)
Sodium: 132 mmol/L — ABNORMAL LOW (ref 135–145)

## 2021-12-02 MED ORDER — OXYCODONE HCL 5 MG PO TABS: 5.0000 mg | ORAL_TABLET | Freq: Four times a day (QID) | ORAL | 0 refills | Status: DC | PRN

## 2021-12-02 MED ORDER — METHOCARBAMOL 500 MG PO TABS: 500.0000 mg | ORAL_TABLET | Freq: Four times a day (QID) | ORAL | 0 refills | Status: DC | PRN

## 2021-12-02 MED ORDER — ASPIRIN 81 MG PO CHEW: 81.0000 mg | CHEWABLE_TABLET | Freq: Two times a day (BID) | ORAL | 0 refills | Status: DC

## 2021-12-02 NOTE — Progress Notes (Signed)
Subjective: 1 Day Post-Op Procedure(s) (LRB): LEFT TOTAL KNEE ARTHROPLASTY (Left) Patient reports pain as mild.    Objective: Vital signs in last 24 hours: Temp:  [97.7 F (36.5 C)-98.5 F (36.9 C)] 97.7 F (36.5 C) (11/18 0950) Pulse Rate:  [65-89] 65 (11/18 0950) Resp:  [11-18] 17 (11/18 0950) BP: (111-162)/(63-83) 111/63 (11/18 0950) SpO2:  [96 %-100 %] 96 % (11/18 0950)  Intake/Output from previous day: 11/17 0701 - 11/18 0700 In: 3190.3 [P.O.:1640; I.V.:1300.3; IV Piggyback:250] Out: 1900 [Urine:1850; Blood:50] Intake/Output this shift: No intake/output data recorded.  Recent Labs    12/02/21 0258  HGB 14.1   Recent Labs    12/02/21 0258  WBC 14.9*  RBC 4.72  HCT 41.3  PLT 320   Recent Labs    12/02/21 0258  NA 132*  K 3.1*  CL 98  CO2 26  BUN 12  CREATININE 0.60*  GLUCOSE 180*  CALCIUM 8.5*   No results for input(s): "LABPT", "INR" in the last 72 hours.  Sensation intact distally Intact pulses distally Dorsiflexion/Plantar flexion intact Incision: dressing C/D/I No cellulitis present Compartment soft   Assessment/Plan: 1 Day Post-Op Procedure(s) (LRB): LEFT TOTAL KNEE ARTHROPLASTY (Left) Up with therapy Discharge home with home health      Jason Blackwell 12/02/2021, 11:34 AM

## 2021-12-02 NOTE — Discharge Summary (Signed)
Patient ID: Jason Blackwell MRN: [id number] DOB/AGE: 20-Oct-1945 76 y.o.  Admit date: 12/01/2021 Discharge date: 12/02/2021  Admission Diagnoses:  Principal Problem:   Unilateral primary osteoarthritis, left knee Active Problems:   Status post total left knee replacement   Discharge Diagnoses:  Same  Past Medical History:  Diagnosis Date   Arthritis    right knee   BPH (benign prostatic hypertrophy)    TURP done   Cancer ([place])    skin   Colon adenoma    Complication of anesthesia    Respiratory issues with Succinylcholine   Coronary artery disease    3 stent   Difficult intubation    Ejection fraction    Erectile dysfunction    Fecal incontinence    GERD (gastroesophageal reflux disease)    Heart murmur [id number]   LVH, diastolic dysfunction, aortic sclerosis   Hemorrhoids    History of kidney stones    Hyperlipidemia    Hypertension    Insomnia     Surgeries: Procedure(s): LEFT TOTAL KNEE ARTHROPLASTY on 12/01/2021   Consultants:   Discharged Condition: Improved  Hospital Course: STEFAN MARKARIAN is an 76 y.o. male who was admitted 12/01/2021 for operative treatment ofUnilateral primary osteoarthritis, left knee. Patient has severe unremitting pain that affects sleep, daily activities, and work/hobbies. After pre-op clearance the patient was taken to the operating room on 12/01/2021 and underwent  Procedure(s): LEFT TOTAL KNEE ARTHROPLASTY.    Patient was given perioperative antibiotics:  Anti-infectives (From admission, onward)    Start     Dose/Rate Route Frequency Ordered Stop   12/01/21 1630  ceFAZolin (ANCEF) IVPB 1 g/50 mL premix        1 g 100 mL/hr over 30 Minutes Intravenous Every 6 hours 12/01/21 1338 12/01/21 2222   12/01/21 0815  ceFAZolin (ANCEF) IVPB 2g/100 mL premix        2 g 200 mL/hr over 30 Minutes Intravenous On call to O.R. 12/01/21 0802 12/01/21 1038        Patient was given sequential compression devices, early ambulation, and  chemoprophylaxis to prevent DVT.  Patient benefited maximally from hospital stay and there were no complications.    Recent vital signs: Patient Vitals for the past 24 hrs:  BP Temp Temp src Pulse Resp SpO2  12/02/21 0950 111/63 97.7 F (36.5 C) Oral 65 17 96 %  12/02/21 0610 129/67 98.2 F (36.8 C) -- 74 17 97 %  12/02/21 0125 138/76 98 F (36.7 C) -- 78 17 97 %  12/01/21 2103 126/74 97.8 F (36.6 C) -- 82 17 96 %  12/01/21 1634 (!) 142/81 98.3 F (36.8 C) Oral 89 17 97 %  12/01/21 1338 (!) 142/83 98 F (36.7 C) Oral 84 16 96 %  12/01/21 1300 126/69 98.1 F (36.7 C) -- 80 13 98 %  12/01/21 1245 131/71 -- -- 81 18 98 %  12/01/21 1230 135/78 -- -- 82 18 99 %  12/01/21 1215 132/81 -- -- 81 11 98 %  12/01/21 1200 (!) 162/80 -- -- 80 18 100 %  12/01/21 1157 (!) 144/82 98.5 F (36.9 C) -- 81 17 100 %     Recent laboratory studies:  Recent Labs    12/02/21 0258  WBC 14.9*  HGB 14.1  HCT 41.3  PLT 320  NA 132*  K 3.1*  CL 98  CO2 26  BUN 12  CREATININE 0.60*  GLUCOSE 180*  CALCIUM 8.5*     Discharge Medications:  Allergies as of 12/02/2021       Reactions   Latex    redness   Statins    myalgia   Succinylcholine Chloride Other (See Comments)   Respiratory problems   Sulfa Antibiotics Itching, Rash   Sulfamethoxazole Itching            Medication List     TAKE these medications    acetaminophen 500 MG tablet Commonly known as: TYLENOL Take 1,000 mg by mouth 2 (two) times daily.   amLODipine 10 MG tablet Commonly known as: NORVASC Take 10 mg by mouth daily.   aspirin 81 MG chewable tablet Chew 1 tablet (81 mg total) by mouth 2 (two) times daily. What changed: when to take this   chlorthalidone 25 MG tablet Commonly known as: HYGROTON TAKE 1 TABLET (25 MG TOTAL) BY MOUTH DAILY.   famotidine 20 MG tablet Commonly known as: PEPCID Take 20 mg by mouth every evening.   ferrous sulfate 325 (65 FE) MG tablet Take 325 mg by mouth every other  day.   hydrocortisone cream 1 % Apply 1 Application topically daily as needed for itching.   methocarbamol 500 MG tablet Commonly known as: ROBAXIN Take 1 tablet (500 mg total) by mouth every 6 (six) hours as needed for muscle spasms.   omeprazole 40 MG capsule Commonly known as: PRILOSEC Take 40 mg by mouth in the morning and at bedtime.   ondansetron 4 MG tablet Commonly known as: ZOFRAN Take 1 tablet (4 mg total) by mouth every 8 (eight) hours as needed for nausea or vomiting.   OVER THE COUNTER MEDICATION Take 3 capsules by mouth daily. Balance of nature veggies   OVER THE COUNTER MEDICATION Take 3 capsules by mouth daily. Balance of nature fruits   OVER THE COUNTER MEDICATION Take 2 capsules by mouth at bedtime. Relaxium sleep   oxyCODONE 5 MG immediate release tablet Commonly known as: Oxy IR/ROXICODONE Take 1 tablet (5 mg total) by mouth every 6 (six) hours as needed for moderate pain (pain score 4-6).   Repatha SureClick 409 MG/ML Soaj Generic drug: Evolocumab INJECT 140 MG INTO THE SKIN EVERY 14 (FOURTEEN) DAYS.   tadalafil 5 MG tablet Commonly known as: CIALIS Take 5 mg by mouth daily.   telmisartan 80 MG tablet Commonly known as: MICARDIS Take 80 mg by mouth daily.               Durable Medical Equipment  (From admission, onward)           Start     Ordered   12/01/21 1339  DME 3 n 1  Once        12/01/21 1338   12/01/21 1339  DME Walker rolling  Once       Question Answer Comment  Walker: With 5 Inch Wheels   Patient needs a walker to treat with the following condition Status post total left knee replacement      12/01/21 1338            Diagnostic Studies: DG Knee Left Port  Result Date: 12/01/2021 CLINICAL DATA:  [id number] Status post total left knee replacement [id number] EXAM: PORTABLE LEFT KNEE - 1-2 VIEW COMPARISON:  Knee radiograph 10/16/2021 FINDINGS: Postoperative changes of left total knee arthroplasty. Normal alignment  without evidence of loosening or periprosthetic fracture. Expected soft tissue changes including a joint effusion. IMPRESSION: Postsurgical changes of left total knee arthroplasty. No evidence of immediate hardware complication. Electronically Signed   By:  Maurine Simmering M.D.   On: 12/01/2021 12:39    Disposition: Discharge disposition: 01-Home or [place], [place] Follow up.   Specialty: [place] Why: to provide home physical therapy visits Contact information: [address] [place] [address] [phone]         Mcarthur Rossetti, MD Follow up in 2 week(s).   Specialty: Orthopedic Surgery Contact information: [address] [place] [address] [phone]                  Signed: Mcarthur Rossetti 12/02/2021, 11:36 AM

## 2021-12-02 NOTE — Progress Notes (Signed)
Physical Therapy Treatment Patient Details Name: Jason Blackwell MRN: [id number] DOB: Feb 23, 1945 Today's Date: 12/02/2021   History of Present Illness Pt is a 76yo male presenting s/p L-TKA on 12/01/21. PMH BPH, CAD s/p cath, GERD, HLD, HTN, R-TKA 07/21/21.    PT Comments    Pt continues very motivated and up to ambulate increased distance in hall with noted improvement in stability.  Pt negotiated single step in preparation to enter home.  Pt and spouse eager for dc home this date and report HHPT will have first session tomorrow.   Recommendations for follow up therapy are one component of a multi-disciplinary discharge planning process, led by the attending physician.  Recommendations may be updated based on patient status, additional functional criteria and insurance authorization.  Follow Up Recommendations  Follow physician's recommendations for discharge plan and follow up therapies     Assistance Recommended at Discharge Frequent or constant Supervision/Assistance  Patient can return home with the following A little help with walking and/or transfers;A little help with bathing/dressing/bathroom;Assistance with cooking/housework;Assist for transportation;Help with stairs or ramp for entrance   Equipment Recommendations  None recommended by PT    Recommendations for Other Services       Precautions / Restrictions Precautions Precautions: Fall;Knee Precaution Booklet Issued: No Precaution Comments: no pillow under the knee Required Braces or Orthoses: Knee Immobilizer - Left Knee Immobilizer - Left: On when out of bed or walking;Discontinue post op day 2 Restrictions Weight Bearing Restrictions: No LLE Weight Bearing: Weight bearing as tolerated     Mobility  Bed Mobility Overal bed mobility: Needs Assistance Bed Mobility: Supine to Sit     Supine to sit: Supervision     General bed mobility comments: increased time and use of bed rail    Transfers Overall  transfer level: Needs assistance Equipment used: Rolling walker (2 wheels) Transfers: Sit to/from Stand Sit to Stand: Min guard, Supervision           General transfer comment: cues for LE management and use of UEs to self assist    Ambulation/Gait Ambulation/Gait assistance: Min guard, Supervision Gait Distance (Feet): 140 Feet Assistive device: Rolling walker (2 wheels) Gait Pattern/deviations: Step-to pattern Gait velocity: decreased     General Gait Details: Pt ambulated with RW and min guard, no physical assist or overt LOB noted, VCs for sequencing   Stairs Stairs: Yes Stairs assistance: Min assist Stair Management: No rails, Step to pattern, Backwards, Forwards, With walker Number of Stairs: 2 General stair comments: single step twice - fwd and bkwd - min cues for sequence   Wheelchair Mobility    Modified Rankin (Stroke Patients Only)       Balance Overall balance assessment: Needs assistance Sitting-balance support: Feet supported, No upper extremity supported Sitting balance-Leahy Scale: Good     Standing balance support: Single extremity supported Standing balance-Leahy Scale: Poor                              Cognition Arousal/Alertness: Awake/alert Behavior During Therapy: WFL for tasks assessed/performed Overall Cognitive Status: Within Functional Limits for tasks assessed                                          Exercises Total Joint Exercises Ankle Circles/Pumps: AROM, Both, 20 reps Quad Sets: AROM, Both, 10 reps, Supine Gluteal Sets:  AAROM, AROM, Left, 15 reps, Supine Heel Slides: AAROM, Left, 15 reps, Supine    General Comments General comments (skin integrity, edema, etc.): Wife present for session      Pertinent Vitals/Pain Pain Assessment Pain Assessment: 0-10 Pain Score: 5  Pain Location: L knee Pain Descriptors / Indicators: Aching, Sore Pain Intervention(s): Limited activity within patient's  tolerance, Monitored during session, Premedicated before session, Ice applied    Home Living                          Prior Function            PT Goals (current goals can now be found in the care plan section) Acute Rehab PT Goals Patient Stated Goal: Home PT Goal Formulation: With patient Time For Goal Achievement: 12/08/21 Potential to Achieve Goals: Good Progress towards PT goals: Progressing toward goals    Frequency    7X/week      PT Plan Current plan remains appropriate    Co-evaluation              AM-PAC PT "6 Clicks" Mobility   Outcome Measure  Help needed turning from your back to your side while in a flat bed without using bedrails?: None Help needed moving from lying on your back to sitting on the side of a flat bed without using bedrails?: A Little Help needed moving to and from a bed to a chair (including a wheelchair)?: A Little Help needed standing up from a chair using your arms (e.g., wheelchair or bedside chair)?: A Little Help needed to walk in hospital room?: A Little Help needed climbing 3-5 steps with a railing? : A Little 6 Click Score: 19    End of Session Equipment Utilized During Treatment: Gait belt Activity Tolerance: Patient tolerated treatment well Patient left: in chair;with call bell/phone within reach;with chair alarm set;with family/visitor present Nurse Communication: Mobility status PT Visit Diagnosis: Pain;Difficulty in walking, not elsewhere classified (R26.2) Pain - Right/Left: Left Pain - part of body: Knee     Time: 1155-2080 PT Time Calculation (min) (ACUTE ONLY): 22 min  Charges:  $Gait Training: 8-22 mins $Therapeutic Exercise: 8-22 mins                     Debe Coder PT Acute Rehabilitation Services Pager [phone] Office [phone]    Bradrick Kamau 12/02/2021, 10:14 AM

## 2021-12-02 NOTE — Discharge Instructions (Signed)

## 2021-12-02 NOTE — Progress Notes (Signed)
Physical Therapy Treatment Patient Details Name: Jason Blackwell MRN: [id number] DOB: 1945/07/04 Today's Date: 12/02/2021   History of Present Illness Pt is a 76yo male presenting s/p L-TKA on 12/01/21. PMH BPH, CAD s/p cath, GERD, HLD, HTN, R-TKA 07/21/21.    PT Comments    Pt very motivated and performed HEP with assist.  Short break with arrival of bfast and will follow up shortly for gait/stair training.   Recommendations for follow up therapy are one component of a multi-disciplinary discharge planning process, led by the attending physician.  Recommendations may be updated based on patient status, additional functional criteria and insurance authorization.  Follow Up Recommendations  Follow physician's recommendations for discharge plan and follow up therapies     Assistance Recommended at Discharge Frequent or constant Supervision/Assistance  Patient can return home with the following A little help with walking and/or transfers;A little help with bathing/dressing/bathroom;Assistance with cooking/housework;Assist for transportation;Help with stairs or ramp for entrance   Equipment Recommendations  None recommended by PT    Recommendations for Other Services       Precautions / Restrictions Precautions Precautions: Fall;Knee Precaution Booklet Issued: No Precaution Comments: no pillow under the knee Required Braces or Orthoses: Knee Immobilizer - Left Knee Immobilizer - Left: On when out of bed or walking;Discontinue post op day 2 (pt performed IND SLR this date) Restrictions Weight Bearing Restrictions: No LLE Weight Bearing: Weight bearing as tolerated     Mobility  Bed Mobility                    Transfers                        Ambulation/Gait                   Stairs             Wheelchair Mobility    Modified Rankin (Stroke Patients Only)       Balance                                             Cognition Arousal/Alertness: Awake/alert Behavior During Therapy: WFL for tasks assessed/performed Overall Cognitive Status: Within Functional Limits for tasks assessed                                          Exercises Total Joint Exercises Ankle Circles/Pumps: AROM, Both, 20 reps Quad Sets: AROM, Both, 10 reps, Supine Gluteal Sets: AAROM, AROM, Left, 15 reps, Supine Heel Slides: AAROM, Left, 15 reps, Supine    General Comments        Pertinent Vitals/Pain Pain Assessment Pain Assessment: 0-10 Pain Score: 5  Pain Location: L knee Pain Descriptors / Indicators: Aching, Sore Pain Intervention(s): Monitored during session, Limited activity within patient's tolerance, Premedicated before session, Ice applied    Home Living                          Prior Function            PT Goals (current goals can now be found in the care plan section) Acute Rehab PT Goals Patient Stated Goal: Home PT Goal Formulation: With patient Time For  Goal Achievement: 12/08/21 Potential to Achieve Goals: Good Progress towards PT goals: Progressing toward goals    Frequency    7X/week      PT Plan Current plan remains appropriate    Co-evaluation              AM-PAC PT "6 Clicks" Mobility   Outcome Measure  Help needed turning from your back to your side while in a flat bed without using bedrails?: None Help needed moving from lying on your back to sitting on the side of a flat bed without using bedrails?: A Little Help needed moving to and from a bed to a chair (including a wheelchair)?: A Little Help needed standing up from a chair using your arms (e.g., wheelchair or bedside chair)?: A Little Help needed to walk in hospital room?: A Little Help needed climbing 3-5 steps with a railing? : A Little 6 Click Score: 19    End of Session   Activity Tolerance: Patient tolerated treatment well Patient left: in bed;with call bell/phone within  reach;with bed alarm set;with family/visitor present Nurse Communication: Mobility status PT Visit Diagnosis: Pain;Difficulty in walking, not elsewhere classified (R26.2) Pain - Right/Left: Left Pain - part of body: Knee     Time: [id number] PT Time Calculation (min) (ACUTE ONLY): 21 min  Charges:  $Therapeutic Exercise: 8-22 mins                     Debe Coder PT Acute Rehabilitation Services Pager [phone] Office [phone]    Amadu Schlageter 12/02/2021, 10:09 AM

## 2021-12-03 DIAGNOSIS — H6192 Disorder of left external ear, unspecified: Secondary | ICD-10-CM | POA: Diagnosis not present

## 2021-12-03 DIAGNOSIS — Z7982 Long term (current) use of aspirin: Secondary | ICD-10-CM | POA: Diagnosis not present

## 2021-12-03 DIAGNOSIS — N529 Male erectile dysfunction, unspecified: Secondary | ICD-10-CM | POA: Diagnosis not present

## 2021-12-03 DIAGNOSIS — Z96651 Presence of right artificial knee joint: Secondary | ICD-10-CM | POA: Diagnosis not present

## 2021-12-03 DIAGNOSIS — I10 Essential (primary) hypertension: Secondary | ICD-10-CM | POA: Diagnosis not present

## 2021-12-03 DIAGNOSIS — Z87442 Personal history of urinary calculi: Secondary | ICD-10-CM | POA: Diagnosis not present

## 2021-12-03 DIAGNOSIS — Z85828 Personal history of other malignant neoplasm of skin: Secondary | ICD-10-CM | POA: Diagnosis not present

## 2021-12-03 DIAGNOSIS — N138 Other obstructive and reflux uropathy: Secondary | ICD-10-CM | POA: Diagnosis not present

## 2021-12-03 DIAGNOSIS — N401 Enlarged prostate with lower urinary tract symptoms: Secondary | ICD-10-CM | POA: Diagnosis not present

## 2021-12-03 DIAGNOSIS — G4700 Insomnia, unspecified: Secondary | ICD-10-CM | POA: Diagnosis not present

## 2021-12-03 DIAGNOSIS — Z96652 Presence of left artificial knee joint: Secondary | ICD-10-CM | POA: Diagnosis not present

## 2021-12-03 DIAGNOSIS — I350 Nonrheumatic aortic (valve) stenosis: Secondary | ICD-10-CM | POA: Diagnosis not present

## 2021-12-03 DIAGNOSIS — K219 Gastro-esophageal reflux disease without esophagitis: Secondary | ICD-10-CM | POA: Diagnosis not present

## 2021-12-03 DIAGNOSIS — I2510 Atherosclerotic heart disease of native coronary artery without angina pectoris: Secondary | ICD-10-CM | POA: Diagnosis not present

## 2021-12-03 DIAGNOSIS — Z9181 History of falling: Secondary | ICD-10-CM | POA: Diagnosis not present

## 2021-12-03 DIAGNOSIS — Z471 Aftercare following joint replacement surgery: Secondary | ICD-10-CM | POA: Diagnosis not present

## 2021-12-03 DIAGNOSIS — E785 Hyperlipidemia, unspecified: Secondary | ICD-10-CM | POA: Diagnosis not present

## 2021-12-04 ENCOUNTER — Encounter (HOSPITAL_COMMUNITY): Payer: Self-pay | Admitting: Orthopaedic Surgery

## 2021-12-04 ENCOUNTER — Telehealth: Payer: Self-pay | Admitting: *Deleted

## 2021-12-04 NOTE — Telephone Encounter (Signed)
Ortho bundle D/C call completed. 

## 2021-12-12 ENCOUNTER — Other Ambulatory Visit (INDEPENDENT_AMBULATORY_CARE_PROVIDER_SITE_OTHER): Payer: Self-pay | Admitting: Orthopaedic Surgery

## 2021-12-13 ENCOUNTER — Telehealth: Payer: Self-pay | Admitting: *Deleted

## 2021-12-13 DIAGNOSIS — N138 Other obstructive and reflux uropathy: Secondary | ICD-10-CM | POA: Diagnosis not present

## 2021-12-13 DIAGNOSIS — Z96651 Presence of right artificial knee joint: Secondary | ICD-10-CM | POA: Diagnosis not present

## 2021-12-13 DIAGNOSIS — G4700 Insomnia, unspecified: Secondary | ICD-10-CM | POA: Diagnosis not present

## 2021-12-13 DIAGNOSIS — E785 Hyperlipidemia, unspecified: Secondary | ICD-10-CM | POA: Diagnosis not present

## 2021-12-13 DIAGNOSIS — N401 Enlarged prostate with lower urinary tract symptoms: Secondary | ICD-10-CM | POA: Diagnosis not present

## 2021-12-13 DIAGNOSIS — K219 Gastro-esophageal reflux disease without esophagitis: Secondary | ICD-10-CM | POA: Diagnosis not present

## 2021-12-13 DIAGNOSIS — I350 Nonrheumatic aortic (valve) stenosis: Secondary | ICD-10-CM | POA: Diagnosis not present

## 2021-12-13 DIAGNOSIS — Z9181 History of falling: Secondary | ICD-10-CM | POA: Diagnosis not present

## 2021-12-13 DIAGNOSIS — Z96652 Presence of left artificial knee joint: Secondary | ICD-10-CM | POA: Diagnosis not present

## 2021-12-13 DIAGNOSIS — Z87442 Personal history of urinary calculi: Secondary | ICD-10-CM | POA: Diagnosis not present

## 2021-12-13 DIAGNOSIS — Z7982 Long term (current) use of aspirin: Secondary | ICD-10-CM | POA: Diagnosis not present

## 2021-12-13 DIAGNOSIS — I10 Essential (primary) hypertension: Secondary | ICD-10-CM | POA: Diagnosis not present

## 2021-12-13 DIAGNOSIS — Z471 Aftercare following joint replacement surgery: Secondary | ICD-10-CM | POA: Diagnosis not present

## 2021-12-13 DIAGNOSIS — Z85828 Personal history of other malignant neoplasm of skin: Secondary | ICD-10-CM | POA: Diagnosis not present

## 2021-12-13 DIAGNOSIS — H6192 Disorder of left external ear, unspecified: Secondary | ICD-10-CM | POA: Diagnosis not present

## 2021-12-13 DIAGNOSIS — N529 Male erectile dysfunction, unspecified: Secondary | ICD-10-CM | POA: Diagnosis not present

## 2021-12-13 DIAGNOSIS — I2510 Atherosclerotic heart disease of native coronary artery without angina pectoris: Secondary | ICD-10-CM | POA: Diagnosis not present

## 2021-12-13 NOTE — Telephone Encounter (Signed)
Ortho bundle call completed. 

## 2021-12-14 ENCOUNTER — Ambulatory Visit (INDEPENDENT_AMBULATORY_CARE_PROVIDER_SITE_OTHER): Payer: PPO | Admitting: Orthopaedic Surgery

## 2021-12-14 ENCOUNTER — Encounter: Payer: Self-pay | Admitting: Orthopaedic Surgery

## 2021-12-14 DIAGNOSIS — Z96652 Presence of left artificial knee joint: Secondary | ICD-10-CM

## 2021-12-14 MED ORDER — OXYCODONE HCL 5 MG PO TABS: 5.0000 mg | ORAL_TABLET | Freq: Four times a day (QID) | ORAL | 0 refills | Status: DC | PRN

## 2021-12-14 NOTE — Progress Notes (Signed)
The patient comes in today for his first postoperative visit status post a left total knee arthroplasty.  He is 76 years old and very active.  He has been on a baby aspirin twice a day.  On exam he is extensively bruised from his thigh all the way to his foot and has blood tracking in a lot of areas with bruising.  This is likely from the tourniquet.  He can go down to just 1 aspirin a day.  He shows through his home therapy notes that his range of motion has been great in terms of him pushing himself.  He has been able to passively flex to 100 degrees.  On exam today I can get him just past 90 degrees.  The staple line looks good so the staples were removed and Steri-Strips applied.  His calf is soft.  He is neurovascular intact.  He will continue to transition outpatient therapy.  I will refill his oxycodone and he will let us know when he needs more.  The next, need to see him in 4 weeks for repeat exam but no x-rays are needed.  All questions and concerns were addressed and answered.

## 2021-12-15 DIAGNOSIS — Z9181 History of falling: Secondary | ICD-10-CM | POA: Diagnosis not present

## 2021-12-15 DIAGNOSIS — I10 Essential (primary) hypertension: Secondary | ICD-10-CM | POA: Diagnosis not present

## 2021-12-15 DIAGNOSIS — Z96651 Presence of right artificial knee joint: Secondary | ICD-10-CM | POA: Diagnosis not present

## 2021-12-15 DIAGNOSIS — N401 Enlarged prostate with lower urinary tract symptoms: Secondary | ICD-10-CM | POA: Diagnosis not present

## 2021-12-15 DIAGNOSIS — Z96652 Presence of left artificial knee joint: Secondary | ICD-10-CM | POA: Diagnosis not present

## 2021-12-15 DIAGNOSIS — N138 Other obstructive and reflux uropathy: Secondary | ICD-10-CM | POA: Diagnosis not present

## 2021-12-15 DIAGNOSIS — Z87442 Personal history of urinary calculi: Secondary | ICD-10-CM | POA: Diagnosis not present

## 2021-12-15 DIAGNOSIS — K219 Gastro-esophageal reflux disease without esophagitis: Secondary | ICD-10-CM | POA: Diagnosis not present

## 2021-12-15 DIAGNOSIS — H6192 Disorder of left external ear, unspecified: Secondary | ICD-10-CM | POA: Diagnosis not present

## 2021-12-15 DIAGNOSIS — E785 Hyperlipidemia, unspecified: Secondary | ICD-10-CM | POA: Diagnosis not present

## 2021-12-15 DIAGNOSIS — G4700 Insomnia, unspecified: Secondary | ICD-10-CM | POA: Diagnosis not present

## 2021-12-15 DIAGNOSIS — Z85828 Personal history of other malignant neoplasm of skin: Secondary | ICD-10-CM | POA: Diagnosis not present

## 2021-12-15 DIAGNOSIS — N529 Male erectile dysfunction, unspecified: Secondary | ICD-10-CM | POA: Diagnosis not present

## 2021-12-15 DIAGNOSIS — Z7982 Long term (current) use of aspirin: Secondary | ICD-10-CM | POA: Diagnosis not present

## 2021-12-15 DIAGNOSIS — I350 Nonrheumatic aortic (valve) stenosis: Secondary | ICD-10-CM | POA: Diagnosis not present

## 2021-12-15 DIAGNOSIS — I2510 Atherosclerotic heart disease of native coronary artery without angina pectoris: Secondary | ICD-10-CM | POA: Diagnosis not present

## 2021-12-15 DIAGNOSIS — Z471 Aftercare following joint replacement surgery: Secondary | ICD-10-CM | POA: Diagnosis not present

## 2021-12-18 ENCOUNTER — Telehealth: Payer: Self-pay | Admitting: *Deleted

## 2021-12-18 DIAGNOSIS — M25462 Effusion, left knee: Secondary | ICD-10-CM | POA: Diagnosis not present

## 2021-12-18 DIAGNOSIS — M25562 Pain in left knee: Secondary | ICD-10-CM | POA: Diagnosis not present

## 2021-12-18 NOTE — Telephone Encounter (Signed)
Ortho bundle 14 day in person meeting completed (Late entry for 12/14/21).

## 2021-12-20 DIAGNOSIS — M25562 Pain in left knee: Secondary | ICD-10-CM | POA: Diagnosis not present

## 2021-12-20 DIAGNOSIS — M25462 Effusion, left knee: Secondary | ICD-10-CM | POA: Diagnosis not present

## 2021-12-21 ENCOUNTER — Telehealth: Payer: Self-pay | Admitting: *Deleted

## 2021-12-21 ENCOUNTER — Other Ambulatory Visit: Payer: Self-pay | Admitting: Orthopaedic Surgery

## 2021-12-21 MED ORDER — METHOCARBAMOL 500 MG PO TABS: 500.0000 mg | ORAL_TABLET | Freq: Four times a day (QID) | ORAL | 0 refills | Status: DC | PRN

## 2021-12-21 MED ORDER — OXYCODONE HCL 5 MG PO TABS: 5.0000 mg | ORAL_TABLET | Freq: Four times a day (QID) | ORAL | 0 refills | Status: DC | PRN

## 2021-12-21 NOTE — Telephone Encounter (Signed)
Patient called and made aware of refill sent.

## 2021-12-21 NOTE — Telephone Encounter (Signed)
Patient left VM requesting refill of pain medication and muscle relaxer. Pharmacy correct in chart. Thank you.

## 2021-12-22 DIAGNOSIS — M25562 Pain in left knee: Secondary | ICD-10-CM | POA: Diagnosis not present

## 2021-12-22 DIAGNOSIS — M25462 Effusion, left knee: Secondary | ICD-10-CM | POA: Diagnosis not present

## 2021-12-25 DIAGNOSIS — M25562 Pain in left knee: Secondary | ICD-10-CM | POA: Diagnosis not present

## 2021-12-25 DIAGNOSIS — M25462 Effusion, left knee: Secondary | ICD-10-CM | POA: Diagnosis not present

## 2021-12-27 DIAGNOSIS — M25562 Pain in left knee: Secondary | ICD-10-CM | POA: Diagnosis not present

## 2021-12-27 DIAGNOSIS — M25462 Effusion, left knee: Secondary | ICD-10-CM | POA: Diagnosis not present

## 2021-12-28 ENCOUNTER — Encounter: Payer: PPO | Admitting: Orthopaedic Surgery

## 2021-12-28 ENCOUNTER — Telehealth: Payer: Self-pay | Admitting: Interventional Cardiology

## 2021-12-28 NOTE — Telephone Encounter (Signed)
Returned call to pt. He was inquiring about renewing his [employer] for his [place]. Fatima Sanger still active through 02/01/22. At that time will be able to renew and I can send pt updated grant info via Joy. He was appreciative for the assistance.

## 2021-12-28 NOTE — Telephone Encounter (Signed)
Patient is calling to speak to you with help with his medication. Please advise

## 2021-12-29 DIAGNOSIS — M25562 Pain in left knee: Secondary | ICD-10-CM | POA: Diagnosis not present

## 2021-12-29 DIAGNOSIS — M25462 Effusion, left knee: Secondary | ICD-10-CM | POA: Diagnosis not present

## 2022-01-01 DIAGNOSIS — M25562 Pain in left knee: Secondary | ICD-10-CM | POA: Diagnosis not present

## 2022-01-01 DIAGNOSIS — M25462 Effusion, left knee: Secondary | ICD-10-CM | POA: Diagnosis not present

## 2022-01-03 DIAGNOSIS — M25562 Pain in left knee: Secondary | ICD-10-CM | POA: Diagnosis not present

## 2022-01-03 DIAGNOSIS — M25462 Effusion, left knee: Secondary | ICD-10-CM | POA: Diagnosis not present

## 2022-01-04 ENCOUNTER — Other Ambulatory Visit: Payer: Self-pay | Admitting: Physician Assistant

## 2022-01-04 ENCOUNTER — Telehealth: Payer: Self-pay | Admitting: *Deleted

## 2022-01-04 MED ORDER — OXYCODONE HCL 5 MG PO TABS: 5.0000 mg | ORAL_TABLET | Freq: Four times a day (QID) | ORAL | 0 refills | Status: DC | PRN

## 2022-01-04 NOTE — Telephone Encounter (Signed)
Patient called requesting a refill of his pain medication. Pharmacy in chart. Thanks.

## 2022-01-17 ENCOUNTER — Encounter: Payer: PPO | Admitting: Orthopaedic Surgery

## 2022-01-22 ENCOUNTER — Ambulatory Visit (INDEPENDENT_AMBULATORY_CARE_PROVIDER_SITE_OTHER): Payer: PPO | Admitting: Orthopaedic Surgery

## 2022-01-22 ENCOUNTER — Encounter: Payer: Self-pay | Admitting: Orthopaedic Surgery

## 2022-01-22 DIAGNOSIS — Z96652 Presence of left artificial knee joint: Secondary | ICD-10-CM

## 2022-01-22 MED ORDER — CELECOXIB 200 MG PO CAPS: 200.0000 mg | ORAL_CAPSULE | Freq: Two times a day (BID) | ORAL | 2 refills | Status: DC | PRN

## 2022-01-22 MED ORDER — OXYCODONE HCL 5 MG PO TABS: 5.0000 mg | ORAL_TABLET | Freq: Three times a day (TID) | ORAL | 0 refills | Status: DC | PRN

## 2022-01-22 MED ORDER — METHOCARBAMOL 500 MG PO TABS: 500.0000 mg | ORAL_TABLET | Freq: Four times a day (QID) | ORAL | 0 refills | Status: DC | PRN

## 2022-01-22 NOTE — Progress Notes (Signed)
The patient is now just past 7 weeks status post a left total knee arthroplasty.  He is close to 6 months out from his right knee replacement.  He is doing well overall.  He does feel like the left knee is taken longer to recover than the right knee.  He is in need of an anti-inflammatory and he does have a lot of appropriate questions asked about his knee.  He has already finished outpatient physical therapy and transition to home exercise program.  He does need refills of his oxycodone and Robaxin.  On exam his right knee from earlier last year has full range of motion with minimal swelling.  The left more recent operative knee is still swollen and painful to be expected but his range of motion is great.  He does have a lot of pain around the quad tendon.  I explained how this is to be expected.  This should improve with time.  I will send in some Celebrex as well as a refill of oxycodone and Robaxin to his pharmacy.  He will continue to increase his activities as comfort allows.  He can alternate ice and heat.  I would like to see him back in 3 months.  At that visit we will have a standing AP and lateral of both knees.  He can also try Voltaren gel as a topical anti-inflammatory on his left knee.

## 2022-01-31 ENCOUNTER — Encounter: Payer: Self-pay | Admitting: Pharmacist

## 2022-02-07 ENCOUNTER — Other Ambulatory Visit: Payer: Self-pay | Admitting: Orthopaedic Surgery

## 2022-02-07 MED ORDER — METHOCARBAMOL 500 MG PO TABS: 500.0000 mg | ORAL_TABLET | Freq: Four times a day (QID) | ORAL | 0 refills | Status: DC | PRN

## 2022-02-07 MED ORDER — OXYCODONE HCL 5 MG PO TABS: 5.0000 mg | ORAL_TABLET | Freq: Three times a day (TID) | ORAL | 0 refills | Status: DC | PRN

## 2022-03-05 ENCOUNTER — Other Ambulatory Visit: Payer: Self-pay | Admitting: Orthopaedic Surgery

## 2022-03-05 MED ORDER — OXYCODONE HCL 5 MG PO TABS: 5.0000 mg | ORAL_TABLET | Freq: Three times a day (TID) | ORAL | 0 refills | Status: DC | PRN

## 2022-03-05 MED ORDER — METHOCARBAMOL 500 MG PO TABS: 500.0000 mg | ORAL_TABLET | Freq: Four times a day (QID) | ORAL | 0 refills | Status: DC | PRN

## 2022-03-22 ENCOUNTER — Encounter: Payer: Self-pay | Admitting: Radiology

## 2022-03-27 ENCOUNTER — Ambulatory Visit (HOSPITAL_COMMUNITY): Payer: PPO | Attending: Cardiovascular Disease

## 2022-03-27 DIAGNOSIS — I350 Nonrheumatic aortic (valve) stenosis: Secondary | ICD-10-CM | POA: Insufficient documentation

## 2022-03-27 LAB — ECHOCARDIOGRAM COMPLETE
AR max vel: 2.18 cm2
AV Area VTI: 2.09 cm2
AV Area mean vel: 2.31 cm2
AV Mean grad: 20 mmHg
AV Peak grad: 36.5 mmHg
Ao pk vel: 3.02 m/s
Area-P 1/2: 3.03 cm2
MV VTI: 2.75 cm2
S' Lateral: 2.45 cm

## 2022-04-22 NOTE — Progress Notes (Unsigned)
No chief complaint on file.  History of Present Illness: 77 yo male with history of CAD, aortic stenosis, chronic diastolic CHF, HTN, HLD, GERD, BPH and skin cancer here today for cardiac follow up. He has been followed by Dr. Katrinka Blazing. I am meeting him for the first time today. He has CAD and had three drug eluting stents placed in the LAD in 2015. He has been followed for moderate aortic stenosis. Echo March 2024 with LVEF=60-65%. Moderate aortic stenosis with mean gradient 20 mmHg, AVA 2.09 cm2, SVI 66, DI 0.46.   Primary Care Physician: Joya Martyr, MD   Past Medical History:  Diagnosis Date   Arthritis    right knee   BPH (benign prostatic hypertrophy)    TURP done   Cancer [facility])    skin   Colon adenoma    Complication of anesthesia    Respiratory issues with Succinylcholine   Coronary artery disease    3 stent   Difficult intubation    Ejection fraction    Erectile dysfunction    Fecal incontinence    GERD (gastroesophageal reflux disease)    Heart murmur 2008   LVH, diastolic dysfunction, aortic sclerosis   Hemorrhoids    History of kidney stones    Hyperlipidemia    Hypertension    Insomnia     Past Surgical History:  Procedure Laterality Date   CATARACT EXTRACTION W/PHACO Right 09/24/2018   Procedure: CATARACT EXTRACTION PHACO AND INTRAOCULAR LENS PLACEMENT (IOC)COMPLICATED RIGHT;  Surgeon: Lockie Mola, MD;  Location: [facility];  Service: Ophthalmology;  Laterality: Right;  MAYLUGIN   CATARACT EXTRACTION W/PHACO Left 10/15/2018   Procedure: CATARACT EXTRACTION PHACO AND INTRAOCULAR LENS PLACEMENT (IOC) RIGHT  01:16.3  17.9%  13.79;  Surgeon: Lockie Mola, MD;  Location: [facility];  Service: Ophthalmology;  Laterality: Left;   LEFT HEART CATHETERIZATION WITH CORONARY ANGIOGRAM N/A 12/31/2013   Procedure: LEFT HEART CATHETERIZATION WITH CORONARY ANGIOGRAM;  Surgeon: Lesleigh Noe, MD;  Location: [facility];  Service:  Cardiovascular;  Laterality: N/A; Ostial and mid LAD stenting   PROSTATE BIOPSY  ~ 1991   SKIN LESION EXCISION Right 01/16/2011   precancerous lesion from arm   TONSILLECTOMY     as a child   TOTAL KNEE ARTHROPLASTY Right 07/21/2021   Procedure: RIGHT TOTAL KNEE ARTHROPLASTY;  Surgeon: Kathryne Hitch, MD;  Location: [facility];  Service: Orthopedics;  Laterality: Right;   TOTAL KNEE ARTHROPLASTY Left 12/01/2021   Procedure: LEFT TOTAL KNEE ARTHROPLASTY;  Surgeon: Kathryne Hitch, MD;  Location: [facility];  Service: Orthopedics;  Laterality: Left;   TRANSURETHRAL RESECTION OF PROSTATE N/A 07/09/2019   Procedure: TRANSURETHRAL RESECTION OF THE PROSTATE (TURP);  Surgeon: Marcine Matar, MD;  Location: [facility];  Service: Urology;  Laterality: N/A;    Current Outpatient Medications  Medication Sig Dispense Refill   acetaminophen (TYLENOL) 500 MG tablet Take 1,000 mg by mouth 2 (two) times daily.     amLODipine (NORVASC) 10 MG tablet Take 10 mg by mouth daily.     aspirin 81 MG chewable tablet Chew 1 tablet (81 mg total) by mouth 2 (two) times daily. 30 tablet 0   celecoxib (CELEBREX) 200 MG capsule Take 1 capsule (200 mg total) by mouth 2 (two) times daily between meals as needed. 60 capsule 2   chlorthalidone (HYGROTON) 25 MG tablet TAKE 1 TABLET (25 MG TOTAL) BY MOUTH DAILY. 90 tablet 0   Evolocumab (REPATHA SURECLICK) 140 MG/ML  SOAJ INJECT 140 MG INTO THE SKIN EVERY 14 (FOURTEEN) DAYS. 6 mL 3   famotidine (PEPCID) 20 MG tablet Take 20 mg by mouth every evening.     ferrous sulfate 325 (65 FE) MG tablet Take 325 mg by mouth every other day.     hydrocortisone cream 1 % Apply 1 Application topically daily as needed for itching.     methocarbamol (ROBAXIN) 500 MG tablet Take 1 tablet (500 mg total) by mouth every 6 (six) hours as needed for muscle spasms. 40 tablet 0   omeprazole (PRILOSEC) 40 MG capsule Take 40 mg by mouth in the morning and at bedtime.     ondansetron (ZOFRAN) 4 MG  tablet Take 1 tablet (4 mg total) by mouth every 8 (eight) hours as needed for nausea or vomiting. (Patient not taking: Reported on 11/23/2021) 20 tablet 0   OVER THE COUNTER MEDICATION Take 3 capsules by mouth daily. Balance of nature veggies     OVER THE COUNTER MEDICATION Take 3 capsules by mouth daily. Balance of nature fruits     OVER THE COUNTER MEDICATION Take 2 capsules by mouth at bedtime. Relaxium sleep     oxyCODONE (OXY IR/ROXICODONE) 5 MG immediate release tablet Take 1 tablet (5 mg total) by mouth 3 (three) times daily as needed for moderate pain (pain score 4-6). 30 tablet 0   tadalafil (CIALIS) 5 MG tablet Take 5 mg by mouth daily.     telmisartan (MICARDIS) 80 MG tablet Take 80 mg by mouth daily.     No current facility-administered medications for this visit.    Allergies  Allergen Reactions   Latex     redness   Statins     myalgia   Succinylcholine Chloride Other (See Comments)    Respiratory problems   Sulfa Antibiotics Itching and Rash   Sulfamethoxazole Itching          Social History   Socioeconomic History   Marital status: Married    Spouse name: Not on file   Number of children: Not on file   Years of education: Not on file   Highest education level: Not on file  Occupational History   Not on file  Tobacco Use   Smoking status: Never   Smokeless tobacco: Never  Vaping Use   Vaping Use: Never used  Substance and Sexual Activity   Alcohol use: Yes    Alcohol/week: 28.0 standard drinks of alcohol    Types: 28 Glasses of wine per week    Comment: occas.   Drug use: No   Sexual activity: Yes  Other Topics Concern   Not on file  Social History Narrative   Not on file   Social Determinants of Health   Financial Resource Strain: Not on file  Food Insecurity: No Food Insecurity (12/01/2021)   Hunger Vital Sign    Worried About Running Out of Food in the Last Year: Never true    Ran Out of Food in the Last Year: Never true  Transportation  Needs: No Transportation Needs (12/01/2021)   PRAPARE - Administrator, Civil ServiceTransportation    Lack of Transportation (Medical): No    Lack of Transportation (Non-Medical): No  Physical Activity: Not on file  Stress: Not on file  Social Connections: Not on file  Intimate Partner Violence: Not At Risk (12/01/2021)   Humiliation, Afraid, Rape, and Kick questionnaire    Fear of Current or Ex-Partner: No    Emotionally Abused: No    Physically Abused: No  Sexually Abused: No    Family History  Problem Relation Age of Onset   Heart disease Mother    Heart attack Father    Diabetes Sister    Cancer - Colon Maternal Uncle     Review of Systems:  As stated in the HPI and otherwise negative.   There were no vitals taken for this visit.  Physical Examination: General: Well developed, well nourished, NAD  HEENT: OP clear, mucus membranes moist  SKIN: warm, dry. No rashes. Neuro: No focal deficits  Musculoskeletal: Muscle strength 5/5 all ext  Psychiatric: Mood and affect normal  Neck: No JVD, no carotid bruits, no thyromegaly, no lymphadenopathy.  Lungs:Clear bilaterally, no wheezes, rhonci, crackles Cardiovascular: Regular rate and rhythm. *** No murmurs, gallops or rubs. Abdomen:Soft. Bowel sounds present. Non-tender.  Extremities: No lower extremity edema. Pulses are 2 + in the bilateral DP/PT.  EKG:  EKG is ordered today. The ekg ordered today demonstrates ***  Echo March 2024: 1. Left ventricular ejection fraction, by estimation, is 60 to 65%. The  left ventricle has normal function. The left ventricle has no regional  wall motion abnormalities. There is mild left ventricular hypertrophy of  the septal segment. Left ventricular  diastolic parameters are consistent with Grade I diastolic dysfunction  (impaired relaxation). Elevated left ventricular end-diastolic pressure.   2. Right ventricular systolic function is normal. The right ventricular  size is normal. There is normal pulmonary  artery systolic pressure.   3. Left atrial size was severely dilated.   4. The mitral valve is normal in structure. No evidence of mitral valve  regurgitation. No evidence of mitral stenosis.   5. Mean aortic valve gradient 08/2021 was 27 mmHg. The aortic valve is  tricuspid. There is mild calcification of the aortic valve. There is mild  thickening of the aortic valve. Aortic valve regurgitation is not  visualized. Moderate aortic valve stenosis.   Aortic valve area, by VTI measures 2.09 cm. Aortic valve mean gradient  measures 20.0 mmHg. Aortic valve Vmax measures 3.02 m/s.   6. The inferior vena cava is normal in size with greater than 50%  respiratory variability, suggesting right atrial pressure of 3 mmHg.   Recent Labs: 12/02/2021: BUN 12; Creatinine, Ser 0.60; Hemoglobin 14.1; Platelets 320; Potassium 3.1; Sodium 132   Lipid Panel    Component Value Date/Time   CHOL 149 05/21/2017 0930   TRIG 89 05/21/2017 0930   HDL 59 05/21/2017 0930   CHOLHDL 2.5 05/21/2017 0930   LDLCALC 72 05/21/2017 0930     Wt Readings from Last 3 Encounters:  12/01/21 103 kg  11/28/21 103 kg  10/23/21 103.1 kg      Assessment and Plan:   1. CAD without angina: No chest pain suggestive of angina: Continue ASA and Repatha. He is statin intolerant.   2. Aortic stenosis: Moderate by echo in March 2024. Repeat echo in March 2025.   3. Chronic diastolic CHF: Weight is stable. No volume overload on exam. He has not required Lasix.   4. HTN: BP is well controlled. Continue current medications.   5. Hyperlipidemia: Lipids followed in primary care. LDL ***. Continue Repatha.   Labs/ tests ordered today include:  No orders of the defined types were placed in this encounter.  Disposition:   F/U with me in one year   Signed, Verne Carrow, MD, [facility] 04/22/2022 12:28 PM    [facility] [address] [place], [place], [place]  [address] Phone: [phone])  [phone]; Fax: [phone]

## 2022-04-23 ENCOUNTER — Ambulatory Visit: Payer: PPO | Attending: Cardiovascular Disease | Admitting: Cardiovascular Disease

## 2022-04-23 ENCOUNTER — Encounter: Payer: Self-pay | Admitting: Cardiovascular Disease

## 2022-04-23 VITALS — BP 148/78 | HR 74 | Ht 70.0 in | Wt 235.8 lb

## 2022-04-23 DIAGNOSIS — I350 Nonrheumatic aortic (valve) stenosis: Secondary | ICD-10-CM | POA: Diagnosis not present

## 2022-04-23 DIAGNOSIS — I2510 Atherosclerotic heart disease of native coronary artery without angina pectoris: Secondary | ICD-10-CM

## 2022-04-23 DIAGNOSIS — I10 Essential (primary) hypertension: Secondary | ICD-10-CM | POA: Diagnosis not present

## 2022-04-23 DIAGNOSIS — E782 Mixed hyperlipidemia: Secondary | ICD-10-CM

## 2022-04-23 NOTE — Patient Instructions (Signed)
Medication Instructions:  No changes *If you need a refill on your cardiac medications before your next appointment, please call your pharmacy*   Lab Work: none   Testing/Procedures:ECHO DUE IN MARCH 2025 Your physician has requested that you have an echocardiogram. Echocardiography is a painless test that uses sound waves to create images of your heart. It provides your doctor with information about the size and shape of your heart and how well your heart's chambers and valves are working. This procedure takes approximately one hour. There are no restrictions for this procedure. Please do NOT wear cologne, perfume, aftershave, or lotions (deodorant is allowed). Please arrive 15 minutes prior to your appointment time.     Follow-Up: At [facility], you and your health needs are our priority.  As part of our continuing mission to provide you with exceptional heart care, we have created designated Provider Care Teams.  These Care Teams include your primary Cardiologist (physician) and Advanced Practice Providers (APPs -  Physician Assistants and Nurse Practitioners) who all work together to provide you with the care you need, when you need it.   Your next appointment:   12 month(s)  Provider:   Verne Carrow, MD

## 2022-04-25 ENCOUNTER — Encounter: Payer: Self-pay | Admitting: Orthopaedic Surgery

## 2022-04-25 ENCOUNTER — Ambulatory Visit (INDEPENDENT_AMBULATORY_CARE_PROVIDER_SITE_OTHER): Payer: PPO | Admitting: Orthopaedic Surgery

## 2022-04-25 ENCOUNTER — Other Ambulatory Visit (INDEPENDENT_AMBULATORY_CARE_PROVIDER_SITE_OTHER): Payer: PPO

## 2022-04-25 DIAGNOSIS — Z96651 Presence of right artificial knee joint: Secondary | ICD-10-CM | POA: Diagnosis not present

## 2022-04-25 DIAGNOSIS — Z96652 Presence of left artificial knee joint: Secondary | ICD-10-CM

## 2022-04-25 NOTE — Progress Notes (Signed)
The patient is well-known to me.  He has a history of both his knees being replaced.  We replaced his right knee in July of last year and his left knee in November of last year.  He is an active 77 year old gentleman.  He has been doing well with range of motion and strength but he is lost a lot of his sense of balance he states.  Both knees have just some slight swelling with good range of motion overall.  Both knees feel ligamentously stable.  AP and lateral both knees show well-seated total knee arthroplasties with no complicating features.  I definitely feel that he would benefit from outpatient physical therapy to work on his proprioception with balance and coordination and lower extremity strengthening not needing to focus on knee range of motion since that is doing well for him.  He agrees with this so I gave him a prescription for outpatient physical therapy with [facility] in [place].  Will see him back in 3 months to see how he is doing overall but no x-rays are needed.

## 2022-04-27 ENCOUNTER — Other Ambulatory Visit: Payer: Self-pay | Admitting: Emergency Medicine

## 2022-05-22 DIAGNOSIS — Z961 Presence of intraocular lens: Secondary | ICD-10-CM | POA: Diagnosis not present

## 2022-05-22 DIAGNOSIS — H43813 Vitreous degeneration, bilateral: Secondary | ICD-10-CM | POA: Diagnosis not present

## 2022-05-22 DIAGNOSIS — H33312 Horseshoe tear of retina without detachment, left eye: Secondary | ICD-10-CM | POA: Diagnosis not present

## 2022-05-30 DIAGNOSIS — I10 Essential (primary) hypertension: Secondary | ICD-10-CM | POA: Diagnosis not present

## 2022-05-30 DIAGNOSIS — I700 Atherosclerosis of aorta: Secondary | ICD-10-CM | POA: Diagnosis not present

## 2022-05-30 DIAGNOSIS — G720 Drug-induced myopathy: Secondary | ICD-10-CM | POA: Diagnosis not present

## 2022-05-30 DIAGNOSIS — R972 Elevated prostate specific antigen [PSA]: Secondary | ICD-10-CM | POA: Diagnosis not present

## 2022-05-30 DIAGNOSIS — I350 Nonrheumatic aortic (valve) stenosis: Secondary | ICD-10-CM | POA: Diagnosis not present

## 2022-05-30 DIAGNOSIS — N400 Enlarged prostate without lower urinary tract symptoms: Secondary | ICD-10-CM | POA: Diagnosis not present

## 2022-05-30 DIAGNOSIS — E7800 Pure hypercholesterolemia, unspecified: Secondary | ICD-10-CM | POA: Diagnosis not present

## 2022-05-30 DIAGNOSIS — I2510 Atherosclerotic heart disease of native coronary artery without angina pectoris: Secondary | ICD-10-CM | POA: Diagnosis not present

## 2022-05-30 DIAGNOSIS — Z0000 Encounter for general adult medical examination without abnormal findings: Secondary | ICD-10-CM | POA: Diagnosis not present

## 2022-05-30 DIAGNOSIS — E876 Hypokalemia: Secondary | ICD-10-CM | POA: Diagnosis not present

## 2022-05-30 DIAGNOSIS — Z85820 Personal history of malignant melanoma of skin: Secondary | ICD-10-CM | POA: Diagnosis not present

## 2022-05-31 DIAGNOSIS — M25561 Pain in right knee: Secondary | ICD-10-CM | POA: Diagnosis not present

## 2022-05-31 DIAGNOSIS — R2681 Unsteadiness on feet: Secondary | ICD-10-CM | POA: Diagnosis not present

## 2022-05-31 DIAGNOSIS — M25562 Pain in left knee: Secondary | ICD-10-CM | POA: Diagnosis not present

## 2022-06-04 DIAGNOSIS — M25562 Pain in left knee: Secondary | ICD-10-CM | POA: Diagnosis not present

## 2022-06-04 DIAGNOSIS — R2681 Unsteadiness on feet: Secondary | ICD-10-CM | POA: Diagnosis not present

## 2022-06-04 DIAGNOSIS — M25561 Pain in right knee: Secondary | ICD-10-CM | POA: Diagnosis not present

## 2022-06-13 DIAGNOSIS — R2681 Unsteadiness on feet: Secondary | ICD-10-CM | POA: Diagnosis not present

## 2022-06-13 DIAGNOSIS — M25562 Pain in left knee: Secondary | ICD-10-CM | POA: Diagnosis not present

## 2022-06-13 DIAGNOSIS — M25561 Pain in right knee: Secondary | ICD-10-CM | POA: Diagnosis not present

## 2022-06-14 DIAGNOSIS — E876 Hypokalemia: Secondary | ICD-10-CM | POA: Diagnosis not present

## 2022-06-14 DIAGNOSIS — R739 Hyperglycemia, unspecified: Secondary | ICD-10-CM | POA: Diagnosis not present

## 2022-06-19 DIAGNOSIS — M25561 Pain in right knee: Secondary | ICD-10-CM | POA: Diagnosis not present

## 2022-06-19 DIAGNOSIS — R2681 Unsteadiness on feet: Secondary | ICD-10-CM | POA: Diagnosis not present

## 2022-06-19 DIAGNOSIS — M25562 Pain in left knee: Secondary | ICD-10-CM | POA: Diagnosis not present

## 2022-06-20 DIAGNOSIS — L905 Scar conditions and fibrosis of skin: Secondary | ICD-10-CM | POA: Diagnosis not present

## 2022-06-20 DIAGNOSIS — C44319 Basal cell carcinoma of skin of other parts of face: Secondary | ICD-10-CM | POA: Diagnosis not present

## 2022-06-21 DIAGNOSIS — R310 Gross hematuria: Secondary | ICD-10-CM | POA: Diagnosis not present

## 2022-06-26 DIAGNOSIS — R2681 Unsteadiness on feet: Secondary | ICD-10-CM | POA: Diagnosis not present

## 2022-06-26 DIAGNOSIS — M25561 Pain in right knee: Secondary | ICD-10-CM | POA: Diagnosis not present

## 2022-06-26 DIAGNOSIS — M25562 Pain in left knee: Secondary | ICD-10-CM | POA: Diagnosis not present

## 2022-07-17 DIAGNOSIS — N35012 Post-traumatic membranous urethral stricture: Secondary | ICD-10-CM | POA: Diagnosis not present

## 2022-07-17 DIAGNOSIS — D1803 Hemangioma of intra-abdominal structures: Secondary | ICD-10-CM | POA: Diagnosis not present

## 2022-07-17 DIAGNOSIS — R3914 Feeling of incomplete bladder emptying: Secondary | ICD-10-CM | POA: Diagnosis not present

## 2022-07-17 DIAGNOSIS — R310 Gross hematuria: Secondary | ICD-10-CM | POA: Diagnosis not present

## 2022-07-18 ENCOUNTER — Other Ambulatory Visit: Payer: Self-pay

## 2022-07-18 ENCOUNTER — Encounter (HOSPITAL_COMMUNITY): Payer: Self-pay | Admitting: Urology

## 2022-07-18 ENCOUNTER — Other Ambulatory Visit: Payer: Self-pay | Admitting: Urology

## 2022-07-18 NOTE — Progress Notes (Signed)
Case: [id number] Date/Time: 07/24/22 0930   Procedures:      CYSTOSCOPY WITH URETHRAL DILATATION     TRANSURETHRAL RESECTION OF BLADDER TUMOR (TURBT)   Anesthesia type: General   Pre-op diagnosis: GROSS HEMATURIA, URETHRAL STRICTURE   Location: [facility] ROOM 04 / [facility]   Surgeons: Noel Christmas, MD       DISCUSSION: Jason Blackwell is a 77 yo male who is being evaluated prior to surgery above. Pre-op interview conducted over the phone. PMH significant for HTN, CAD s/p PCI to LAD x 3 (in 2015), dCHF, aortic atherosclerosis, moderate AS, chronic cough, obesity, BPH.  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 Per prior anesthesia notes "Wears med alert necklace stating pt is difficult airway and intubation, and allergic to succinylcholine"   Patient is followed by Cardiology for CAD, moderate AS, CHF, HTN and HLD. Last seen on 04/23/22. Noted to be doing well from a cardiac standpoint. BP noted to be elevated in office but patient BP log shows it's normal at home so no changes were made. He was euvolemic. Statin intolerant, takes Repatha. AS stable on most recent echo in March 2024. PT advised to f/u in 1 year for repeat echo.  VS:     07/18/2022   12:58 PM 04/23/2022   10:30 AM 12/02/2021   12:17 PM  Vitals with BMI  Height 5\' 10"  5\' 10"    Weight 230 lbs 235 lbs 13 oz   BMI 33 33.83   Systolic  148 130  Diastolic  78 79  Pulse  74 69     PROVIDERS: PCP - Renford Dills, MD Cardiologist - Earney Hamburg, MD Pulmonologist - Levy Pupa, MD   LABS: Obtain DOS (all labs ordered are listed, but only abnormal results are displayed)  Labs Reviewed - No data to display   IMAGES: n/a   EKG: Obtain DOS   CV:  Echo 03/27/22:  IMPRESSIONS     1. Left ventricular ejection fraction, by estimation, is 60 to 65%. The  left ventricle has normal function. The left ventricle has no regional  wall motion abnormalities. There is  mild left ventricular hypertrophy of  the septal segment. Left ventricular  diastolic parameters are consistent with Grade I diastolic dysfunction  (impaired relaxation). Elevated left ventricular end-diastolic pressure.   2. Right ventricular systolic function is normal. The right ventricular  size is normal. There is normal pulmonary artery systolic pressure.   3. Left atrial size was severely dilated.   4. The mitral valve is normal in structure. No evidence of mitral valve  regurgitation. No evidence of mitral stenosis.   5. Mean aortic valve gradient 08/2021 was 27 mmHg. The aortic valve is  tricuspid. There is mild calcification of the aortic valve. There is mild  thickening of the aortic valve. Aortic valve regurgitation is not  visualized. Moderate aortic valve stenosis.   Aortic valve area, by VTI measures 2.09 cm. Aortic valve mean gradient  measures 20.0 mmHg. Aortic valve Vmax measures 3.02 m/s.   6. The inferior vena cava is normal in size with greater than 50%  respiratory variability, suggesting right atrial pressure of 3 mmHg.   NM Stress test 06/05/2016:  Nuclear stress EF: 62%. Blood pressure demonstrated a normal response to exercise. There was no ST segment deviation noted during stress. No T wave inversion was noted during stress. The study is normal. This is a low risk study.   Low risk stress nuclear study with normal perfusion  and normal left ventricular regional and global systolic function. Anteroapical ischemia seen in 2015 is no longer present.  [facility] 12/31/2013  IMPRESSIONS:  1. High-risk myocardial perfusion study with anterior ischemia due to ostial LAD of 95% 2. Successful stenting of the ostial LAD from 95% to 0% (4.0 mm post dilatation diameter). 3. 80% mid LAD stenosis reduced to 30% with TIMI grade 3 flow after overlapping stenting and high pressure balloon inflations. Indentation on the proximal margin of the LAD stent cannot be relieved. 4. Widely  patent circumflex and RCA 5. Normal left ventricular systolic function  Past Medical History:  Diagnosis Date   Arthritis    right knee   BPH (benign prostatic hypertrophy)    TURP done   Cancer (HCC)    skin   Colon adenoma    Complication of anesthesia    Respiratory issues with Succinylcholine   Coronary artery disease    3 stent   Difficult intubation    Ejection fraction    Erectile dysfunction    Fecal incontinence    GERD (gastroesophageal reflux disease)    Heart murmur 2008   LVH, diastolic dysfunction, aortic sclerosis   Hemorrhoids    History of kidney stones    Hyperlipidemia    Hypertension    Insomnia    Melanoma (HCC)    Scalp    Past Surgical History:  Procedure Laterality Date   CATARACT EXTRACTION W/PHACO Right 09/24/2018   Procedure: CATARACT EXTRACTION PHACO AND INTRAOCULAR LENS PLACEMENT (IOC)COMPLICATED RIGHT;  Surgeon: Lockie Mola, MD;  Location: [facility];  Service: Ophthalmology;  Laterality: Right;  MAYLUGIN   CATARACT EXTRACTION W/PHACO Left 10/15/2018   Procedure: CATARACT EXTRACTION PHACO AND INTRAOCULAR LENS PLACEMENT (IOC) RIGHT  01:16.3  17.9%  13.79;  Surgeon: Lockie Mola, MD;  Location: [facility];  Service: Ophthalmology;  Laterality: Left;   LEFT HEART CATHETERIZATION WITH CORONARY ANGIOGRAM N/A 12/31/2013   Procedure: LEFT HEART CATHETERIZATION WITH CORONARY ANGIOGRAM;  Surgeon: Lesleigh Noe, MD;  Location: [facility] CATH LAB;  Service: Cardiovascular;  Laterality: N/A; Ostial and mid LAD stenting   PROSTATE BIOPSY  ~ 1991   SKIN LESION EXCISION Right 01/16/2011   precancerous lesion from arm   TONSILLECTOMY     as a child   TOTAL KNEE ARTHROPLASTY Right 07/21/2021   Procedure: RIGHT TOTAL KNEE ARTHROPLASTY;  Surgeon: Kathryne Hitch, MD;  Location: [facility];  Service: Orthopedics;  Laterality: Right;   TOTAL KNEE ARTHROPLASTY Left 12/01/2021   Procedure: LEFT TOTAL KNEE ARTHROPLASTY;  Surgeon:  Kathryne Hitch, MD;  Location: [facility];  Service: Orthopedics;  Laterality: Left;   TRANSURETHRAL RESECTION OF PROSTATE N/A 07/09/2019   Procedure: TRANSURETHRAL RESECTION OF THE PROSTATE (TURP);  Surgeon: Marcine Matar, MD;  Location: [facility];  Service: Urology;  Laterality: N/A;    MEDICATIONS: No current facility-administered medications for this encounter.    acetaminophen (TYLENOL) 500 MG tablet   amLODipine (NORVASC) 10 MG tablet   aspirin EC 81 MG tablet   chlorthalidone (HYGROTON) 25 MG tablet   Evolocumab (REPATHA SURECLICK) 140 MG/ML SOAJ   famotidine (PEPCID) 20 MG tablet   hydrocortisone cream 1 %   loratadine (CLARITIN) 10 MG tablet   omeprazole (PRILOSEC) 40 MG capsule   OVER THE COUNTER MEDICATION   OVER THE COUNTER MEDICATION   OVER THE COUNTER MEDICATION   tadalafil (CIALIS) 5 MG tablet   telmisartan (MICARDIS) 80 MG tablet   Ubaldo Glassing, PA-C [facility] Short  Stay/Anesthesiology [facility] Phone [phone] 07/20/2022 8:23 AM

## 2022-07-18 NOTE — Progress Notes (Addendum)
COVID Vaccine Completed:  Yes  Date of COVID positive in last 90 days:  No  PCP - Renford Dills, MD Cardiologist - Earney Hamburg, MD Pulmonologist - Levy Pupa, MD  Chest x-ray - N/A EKG - Day of surgery Stress Test - 06-05-16 Epic ECHO - 03-27-22 Epic Cardiac Cath - 12-31-13 Pacemaker/ICD device last checked: Spinal Cord Stimulator:  N/A  Bowel Prep - N/A  Sleep Study - N/A CPAP -   Fasting Blood Sugar - N/A Checks Blood Sugar _____ times a day  Last dose of GLP1 agonist-  N/A GLP1 instructions:  N/A   Last dose of SGLT-2 inhibitors-  N/A SGLT-2 instructions: N/A   Blood Thinner Instructions:  Time Aspirin Instructions: ASA 81 Last Dose:  07-15-22  Activity level:  Can go up a flight of stairs and perform activities of daily living without stopping and without symptoms of chest pain or shortness of breath.  Some limitations due to knee pain after knee replacement.   Anesthesia review:  CAD, mod aortic stenosis, murmur, CHF, HTN  Succinylcholine allergy  Patient denies shortness of breath, fever, cough and chest pain at PAT appointment  Patient verbalized understanding of instructions that were given to them at the PAT appointment. Patient was also instructed that they will need to review over the PAT instructions again at home before surgery.

## 2022-07-20 NOTE — Anesthesia Preprocedure Evaluation (Signed)
Anesthesia Evaluation  Patient identified by MRN, date of birth, ID band Patient awake    Reviewed: Allergy & Precautions, NPO status , Patient's Chart, lab work & pertinent test results  History of Anesthesia Complications (+) DIFFICULT AIRWAY and history of anesthetic complications (Patient describes reaction to succinylcholine approximately 25 years ago at [facility] where he was told he should never receive this drug. He was advised to wear a medical alert bracelet. He has never heard the term "malignant hyperthermia".)  Airway Mallampati: III  TM Distance: >3 FB Neck ROM: Full    Dental no notable dental hx.    Pulmonary neg pulmonary ROS   Pulmonary exam normal        Cardiovascular hypertension, Pt. on medications + CAD and + Cardiac Stents (2015)  Normal cardiovascular exam  TTE 03/27/22: EF 60-65%, mild LVH, grade I DD, severe LAE, moderate AS    Neuro/Psych    GI/Hepatic Neg liver ROS,GERD  Medicated,,  Endo/Other  negative endocrine ROS    Renal/GU negative Renal ROS   GROSS HEMATURIA, URETHRAL STRICTURE    Musculoskeletal  (+) Arthritis ,    Abdominal   Peds  Hematology negative hematology ROS (+)   Anesthesia Other Findings Day of surgery medications reviewed with patient.  Reproductive/Obstetrics negative OB ROS                              Anesthesia Physical Anesthesia Plan  ASA: 3  Anesthesia Plan: General   Post-op Pain Management: Tylenol PO (pre-op)*   Induction: Intravenous  PONV Risk Score and Plan: 2 and Treatment may vary due to age or medical condition, Ondansetron, Dexamethasone and Midazolam  Airway Management Planned: LMA  Additional Equipment: None  Intra-op Plan:   Post-operative Plan: Extubation in OR  Informed Consent: I have reviewed the patients History and Physical, chart, labs and discussed the procedure including the risks, benefits and  alternatives for the proposed anesthesia with the patient or authorized representative who has indicated his/her understanding and acceptance.     Dental advisory given  Plan Discussed with: CRNA  Anesthesia Plan Comments: (Patient describes reaction to succinylcholine 25 years ago where he couldn't move his arms or legs after a procedure. He does not recall requiring breathing support or ICU admission. He also describes similar reaction to statins and was told he should not have either drug in the future. It is not clear whether his reaction he describes represents myalgias due to succinylcholine or pseudocholinesterase deficiency. He was told to wear a medical alert bracelet that he cannot receive succinylcholine. He has received halogenated anesthetics uneventfully in the more recent past.  Stephannie Peters, MD )         Anesthesia Quick Evaluation

## 2022-07-24 ENCOUNTER — Ambulatory Visit (HOSPITAL_BASED_OUTPATIENT_CLINIC_OR_DEPARTMENT_OTHER): Payer: PPO | Admitting: Medical

## 2022-07-24 ENCOUNTER — Encounter (HOSPITAL_COMMUNITY): Payer: Self-pay | Admitting: Urology

## 2022-07-24 ENCOUNTER — Ambulatory Visit (HOSPITAL_COMMUNITY)
Admission: RE | Admit: 2022-07-24 | Discharge: 2022-07-24 | Disposition: A | Discharge status: Home / Self Care | Payer: PPO | Attending: Urology | Admitting: Urology

## 2022-07-24 ENCOUNTER — Ambulatory Visit (HOSPITAL_COMMUNITY): Payer: PPO | Admitting: Medical

## 2022-07-24 ENCOUNTER — Ambulatory Visit (HOSPITAL_COMMUNITY): Payer: PPO

## 2022-07-24 ENCOUNTER — Other Ambulatory Visit: Payer: Self-pay | Admitting: Urology

## 2022-07-24 ENCOUNTER — Other Ambulatory Visit: Payer: Self-pay

## 2022-07-24 ENCOUNTER — Encounter (HOSPITAL_COMMUNITY)
Admission: RE | Disposition: A | Discharge status: Home / Self Care | Payer: Self-pay | Source: Home / Self Care | Attending: Urology

## 2022-07-24 DIAGNOSIS — N35919 Unspecified urethral stricture, male, unspecified site: Secondary | ICD-10-CM | POA: Diagnosis not present

## 2022-07-24 DIAGNOSIS — N420 Calculus of prostate: Secondary | ICD-10-CM | POA: Diagnosis not present

## 2022-07-24 DIAGNOSIS — N320 Bladder-neck obstruction: Secondary | ICD-10-CM | POA: Diagnosis not present

## 2022-07-24 DIAGNOSIS — N401 Enlarged prostate with lower urinary tract symptoms: Secondary | ICD-10-CM | POA: Diagnosis not present

## 2022-07-24 DIAGNOSIS — I2510 Atherosclerotic heart disease of native coronary artery without angina pectoris: Secondary | ICD-10-CM

## 2022-07-24 DIAGNOSIS — R972 Elevated prostate specific antigen [PSA]: Secondary | ICD-10-CM | POA: Insufficient documentation

## 2022-07-24 DIAGNOSIS — R351 Nocturia: Secondary | ICD-10-CM | POA: Diagnosis not present

## 2022-07-24 DIAGNOSIS — N138 Other obstructive and reflux uropathy: Secondary | ICD-10-CM | POA: Diagnosis not present

## 2022-07-24 DIAGNOSIS — N5201 Erectile dysfunction due to arterial insufficiency: Secondary | ICD-10-CM | POA: Insufficient documentation

## 2022-07-24 DIAGNOSIS — R310 Gross hematuria: Secondary | ICD-10-CM | POA: Diagnosis present

## 2022-07-24 DIAGNOSIS — Z87442 Personal history of urinary calculi: Secondary | ICD-10-CM | POA: Diagnosis not present

## 2022-07-24 DIAGNOSIS — N211 Calculus in urethra: Secondary | ICD-10-CM | POA: Insufficient documentation

## 2022-07-24 DIAGNOSIS — N35913 Unspecified membranous urethral stricture, male: Secondary | ICD-10-CM | POA: Diagnosis not present

## 2022-07-24 DIAGNOSIS — E785 Hyperlipidemia, unspecified: Secondary | ICD-10-CM | POA: Diagnosis not present

## 2022-07-24 DIAGNOSIS — N400 Enlarged prostate without lower urinary tract symptoms: Secondary | ICD-10-CM | POA: Diagnosis not present

## 2022-07-24 DIAGNOSIS — N3289 Other specified disorders of bladder: Secondary | ICD-10-CM

## 2022-07-24 HISTORY — DX: Malignant melanoma of skin, unspecified: C43.9

## 2022-07-24 HISTORY — PX: TRANSURETHRAL RESECTION OF BLADDER TUMOR: SHX2575

## 2022-07-24 HISTORY — PX: CYSTOSCOPY WITH URETHRAL DILATATION: SHX5125

## 2022-07-24 LAB — BASIC METABOLIC PANEL
Anion gap: 12 (ref 5–15)
BUN: 11 mg/dL (ref 8–23)
CO2: 26 mmol/L (ref 22–32)
Calcium: 8.8 mg/dL — ABNORMAL LOW (ref 8.9–10.3)
Chloride: 96 mmol/L — ABNORMAL LOW (ref 98–111)
Creatinine, Ser: 0.62 mg/dL (ref 0.61–1.24)
GFR, Estimated: >60 mL/min (ref >60)
Glucose, Bld: 122 mg/dL — ABNORMAL HIGH (ref 70–99)
Potassium: 3.2 mmol/L — ABNORMAL LOW (ref 3.5–5.1)
Sodium: 134 mmol/L — ABNORMAL LOW (ref 135–145)

## 2022-07-24 LAB — CBC
HCT: 45.2 % (ref 39.0–52.0)
Hemoglobin: 15.7 g/dL (ref 13.0–17.0)
MCH: 31.6 pg (ref 26.0–34.0)
MCHC: 34.7 g/dL (ref 30.0–36.0)
MCV: 90.9 fL (ref 80.0–100.0)
Platelets: 314 10*3/uL (ref 150–400)
RBC: 4.97 MIL/uL (ref 4.22–5.81)
RDW: 13 % (ref 11.5–15.5)
WBC: 7.3 10*3/uL (ref 4.0–10.5)
nRBC: 0 % (ref 0.0–0.2)

## 2022-07-24 SURGERY — CYSTOSCOPY, WITH URETHRAL DILATION
Anesthesia: General

## 2022-07-24 MED ORDER — MIDAZOLAM HCL 2 MG/2ML IJ SOLN
INTRAMUSCULAR | Status: AC
  Filled 2022-07-24: qty 2

## 2022-07-24 MED ORDER — DEXAMETHASONE SODIUM PHOSPHATE 10 MG/ML IJ SOLN
INTRAMUSCULAR | Status: AC
  Filled 2022-07-24: qty 1

## 2022-07-24 MED ORDER — STERILE WATER FOR IRRIGATION IR SOLN
Status: DC | PRN
  Administered 2022-07-24: 500 mL

## 2022-07-24 MED ORDER — FENTANYL CITRATE PF 50 MCG/ML IJ SOSY: 25.0000 ug | PREFILLED_SYRINGE | INTRAMUSCULAR | Status: DC | PRN

## 2022-07-24 MED ORDER — HYOSCYAMINE SULFATE 0.125 MG PO TBDP
0.13 mg | ORAL_TABLET | Freq: Four times a day (QID) | ORAL | Status: AC | PRN
Start: 2022-07-24 — End: ?

## 2022-07-24 MED ORDER — AMISULPRIDE (ANTIEMETIC) 5 MG/2ML IV SOLN: 10.0000 mg | Freq: Once | INTRAVENOUS | Status: DC | PRN

## 2022-07-24 MED ORDER — IOHEXOL 300 MG/ML  SOLN
INTRAMUSCULAR | Status: DC | PRN
  Administered 2022-07-24: 10 mL via URETHRAL

## 2022-07-24 MED ORDER — OXYCODONE HCL 5 MG/5ML PO SOLN: 5.0000 mg | Freq: Once | ORAL | Status: AC | PRN

## 2022-07-24 MED ORDER — ACETAMINOPHEN 500 MG PO TABS
1000.0000 mg | ORAL_TABLET | Freq: Once | ORAL | Status: DC
  Filled 2022-07-24: qty 2

## 2022-07-24 MED ORDER — ORAL CARE MOUTH RINSE: 15.0000 mL | Freq: Once | OROMUCOSAL | Status: AC

## 2022-07-24 MED ORDER — MIDAZOLAM HCL 2 MG/2ML IJ SOLN
INTRAMUSCULAR | Status: DC | PRN
  Administered 2022-07-24: 2 mg via INTRAVENOUS

## 2022-07-24 MED ORDER — ONDANSETRON HCL 4 MG/2ML IJ SOLN
INTRAMUSCULAR | Status: AC
  Filled 2022-07-24: qty 2

## 2022-07-24 MED ORDER — OXYCODONE HCL 5 MG PO TABS
5.0000 mg | ORAL_TABLET | Freq: Once | ORAL | Status: AC | PRN
  Administered 2022-07-24: 5 mg via ORAL

## 2022-07-24 MED ORDER — ONDANSETRON HCL 4 MG/2ML IJ SOLN
INTRAMUSCULAR | Status: DC | PRN
  Administered 2022-07-24: 4 mg via INTRAVENOUS

## 2022-07-24 MED ORDER — EPHEDRINE SULFATE-NACL 50-0.9 MG/10ML-% IV SOSY
PREFILLED_SYRINGE | INTRAVENOUS | Status: DC | PRN
  Administered 2022-07-24: 7.5 mg via INTRAVENOUS

## 2022-07-24 MED ORDER — FENTANYL CITRATE (PF) 100 MCG/2ML IJ SOLN
INTRAMUSCULAR | Status: AC
  Filled 2022-07-24: qty 2

## 2022-07-24 MED ORDER — LIDOCAINE 2% (20 MG/ML) 5 ML SYRINGE
INTRAMUSCULAR | Status: DC | PRN
  Administered 2022-07-24: 100 mg via INTRAVENOUS

## 2022-07-24 MED ORDER — OXYCODONE HCL 5 MG PO TABS
ORAL_TABLET | ORAL | Status: AC
  Filled 2022-07-24: qty 1

## 2022-07-24 MED ORDER — CEPHALEXIN 250 MG PO CAPS: 250.0000 mg | ORAL_CAPSULE | Freq: Every day | ORAL | 0 refills | Status: AC

## 2022-07-24 MED ORDER — PHENYLEPHRINE 80 MCG/ML (10ML) SYRINGE FOR IV PUSH (FOR BLOOD PRESSURE SUPPORT)
PREFILLED_SYRINGE | INTRAVENOUS | Status: DC | PRN
  Administered 2022-07-24: 160 ug via INTRAVENOUS
  Administered 2022-07-24: 120 ug via INTRAVENOUS
  Administered 2022-07-24: 160 ug via INTRAVENOUS

## 2022-07-24 MED ORDER — FENTANYL CITRATE (PF) 100 MCG/2ML IJ SOLN
INTRAMUSCULAR | Status: DC | PRN
  Administered 2022-07-24: 50 ug via INTRAVENOUS
  Administered 2022-07-24 (×2): 25 ug via INTRAVENOUS

## 2022-07-24 MED ORDER — CEFAZOLIN SODIUM-DEXTROSE 2-3 GM-%(50ML) IV SOLR
INTRAVENOUS | Status: DC | PRN
  Administered 2022-07-24: 2 g via INTRAVENOUS

## 2022-07-24 MED ORDER — 0.9 % SODIUM CHLORIDE (POUR BTL) OPTIME
TOPICAL | Status: DC | PRN
  Administered 2022-07-24: 1000 mL

## 2022-07-24 MED ORDER — SODIUM CHLORIDE 0.9 % IR SOLN
Status: DC | PRN
  Administered 2022-07-24: 6000 mL

## 2022-07-24 MED ORDER — LACTATED RINGERS IV SOLN: INTRAVENOUS | Status: DC

## 2022-07-24 MED ORDER — PROPOFOL 10 MG/ML IV BOLUS
INTRAVENOUS | Status: DC | PRN
  Administered 2022-07-24: 160 mg via INTRAVENOUS
  Administered 2022-07-24: 40 mg via INTRAVENOUS

## 2022-07-24 MED ORDER — LIDOCAINE HCL (PF) 2 % IJ SOLN
INTRAMUSCULAR | Status: AC
  Filled 2022-07-24: qty 5

## 2022-07-24 MED ORDER — CHLORHEXIDINE GLUCONATE 0.12 % MT SOLN
15.0000 mL | Freq: Once | OROMUCOSAL | Status: AC
  Administered 2022-07-24: 15 mL via OROMUCOSAL

## 2022-07-24 MED ORDER — DEXAMETHASONE SODIUM PHOSPHATE 4 MG/ML IJ SOLN
INTRAMUSCULAR | Status: DC | PRN
  Administered 2022-07-24: 5 mg via INTRAVENOUS

## 2022-07-24 SURGICAL SUPPLY — 29 items
BAG DRN RND TRDRP ANRFLXCHMBR (UROLOGICAL SUPPLIES) ×1
BAG URINE DRAIN 2000ML AR STRL (UROLOGICAL SUPPLIES) ×1 IMPLANT
BAG URO CATCHER STRL LF (MISCELLANEOUS) ×1 IMPLANT
BALLN NEPHROSTOMY (BALLOONS) ×1
BALLOON NEPHROSTOMY (BALLOONS) IMPLANT
CATH FOLEY 2W COUNCIL 20FR 5CC (CATHETERS) IMPLANT
CATH ROBINSON RED A/P 14FR (CATHETERS) IMPLANT
CATH SILICON 18FR 30CC (CATHETERS) IMPLANT
CATH URET 5FR 70CM CONE TIP (BALLOONS) IMPLANT
CATH URETL OPEN END 6FR 70 (CATHETERS) IMPLANT
CLOTH BEACON ORANGE TIMEOUT ST (SAFETY) ×1 IMPLANT
DRAPE FOOT SWITCH (DRAPES) ×1 IMPLANT
ELECT REM PT RETURN 15FT ADLT (MISCELLANEOUS) IMPLANT
GLOVE BIO SURGEON STRL SZ 6.5 (GLOVE) ×1 IMPLANT
GOWN STRL REUS W/ TWL LRG LVL3 (GOWN DISPOSABLE) ×1 IMPLANT
GOWN STRL REUS W/TWL LRG LVL3 (GOWN DISPOSABLE) ×1
GUIDEWIRE ANG ZIPWIRE 038X150 (WIRE) IMPLANT
GUIDEWIRE STR DUAL SENSOR (WIRE) IMPLANT
KIT TURNOVER KIT A (KITS) IMPLANT
LASER FIB FLEXIVA PULSE ID 365 (Laser) IMPLANT
LOOP CUT BIPOLAR 24F LRG (ELECTROSURGICAL) IMPLANT
MANIFOLD NEPTUNE II (INSTRUMENTS) ×1 IMPLANT
NS IRRIG 1000ML POUR BTL (IV SOLUTION) IMPLANT
PACK CYSTO (CUSTOM PROCEDURE TRAY) ×1 IMPLANT
SYR TOOMEY IRRIG 70ML (MISCELLANEOUS) ×1
SYRINGE TOOMEY IRRIG 70ML (MISCELLANEOUS) IMPLANT
TUBING CONNECTING 10 (TUBING) ×1 IMPLANT
TUBING UROLOGY SET (TUBING) ×1 IMPLANT
WATER STERILE IRR 3000ML UROMA (IV SOLUTION) ×1 IMPLANT

## 2022-07-24 NOTE — H&P (Signed)
CC/HPI: cc: gross hematuria, pain    06/21/2022: Jason Blackwell is a 77 year old man who presents for evaluation and management of gross hematuria. This occurred approximately 10 days ago. He and his wife have an active sex life and he reports that they do have the night once a week. Soon after the last 8 nights he had 2 episodes of clots that were voided. He has not noticed some blood in his urine that has been intermittent. He is not having any pain or difficulty voiding. He has a remote history of kidney stones. He does have a large prostate at 180 g. He had a TURP in 2021. His last upper tract imaging was done in 2012.   07/17/22: 77 year old man with a history of BPH with LUTS who underwent TURP in June 2021 by Dr. Retta Diones. Prostate size 180 g and 56 g were resected, all benign pathology. He has been experiencing painful, intermittent gross hematuria with clots over the last several days. CT scan today shows no evidence of renal or ureteral calculi and no hydronephrosis. There is a calcification that appears to be in prostatic urethra. He has been able to void today.     ALLERGIES: Statins Sulfa Drugs    MEDICATIONS: Cialis 5 mg tablet 1 tablet PO Daily  Omeprazole  Aspirin Ec 81 mg tablet, delayed release 0 Oral  Chlorthalidone 25 mg tablet Oral  Losartan Potassium 100 MG Oral Tablet Oral  Pepcid  Repatha Sureclick 140 mg/ml pen injector  Simethicone 80 mg tablet,chewable Oral     GU PSH: Complex Uroflow - 2019 Cystoscopy - 2019 Cystoscopy TURP - 2021       PSH Notes: Excision Of Lesion Arms, Colonoscopy (Fiberoptic), Tonsillectomy   NON-GU PSH: Diagnostic Colonoscopy - 2012 Remove Tonsils - 2008     GU PMH: Gross hematuria - 06/21/2022 BPH w/LUTS, Large prostate, 2-1/2 years out from TURP with excellent voiding still - 02/21/2022, He is doing well over a year and a half out from his TURP with very good urinary symptomatology, - 02/20/2021, - 2021, Worsening urinary sx's. He is ready  to move forward with TURP which he is already scheduled for. , - 2021 (Worsening), Moderate to severe lower urinary tract symptoms from BPH. He would like consider TURP, - 2020 (Worsening), BPH with severe symptomatology on medical therapy. His gland is 180 mL, which precludes minimally invasive surgery-like Urolift, - 2019, Benign prostatic hyperplasia with urinary obstruction, - 2016 ED due to arterial insufficiency, He uses daily Cialis - 02/21/2022, Cialis at 5 mg not doing that good of a job now, - 02/20/2021, Erectile dysfunction due to arterial insufficiency, - 2016 Nocturia - 02/21/2022, - 02/20/2021, - 2018 Urinary Frequency - 2021, - 2021 Urinary Urgency - 2021, - 2021 Elevated PSA - 2021, (Stable), Stable PSA with multiple negative biopsies. Obviously, his prostate is huge which more than likely explains his PSA, - 2019, Elevated prostate specific antigen (PSA), - 2016 Weak Urinary Stream - 2018 Dysuria, Dysuria - 2014 Other microscopic hematuria, Microscopic hematuria - 2014 Renal calculus, Nephrolithiasis - 2014 Urinary Retention, Unspec, Incomplete bladder emptying - 2014      PMH Notes:  Past Gu Hx:     Jason Blackwell returns for routine followup. There are several issues. These are summarized below.    1. Long-standing history of elevated PSA. The patient has had 5 or 6 separate ultrasounds and biopsies of his prostate. The majority of these were done at an outside institution. We performed a repeat ultrasound  and biopsy on him back in 2008. He was noted at that time to have an enlarged prostate, but biopsies were otherwise negative. The patient has had a PSA which has shown quite a bit of lability. It has been anywhere from 4 to as high as 13 over the past 10 to 15 years. Of note, he has also had at least one saturation biopsy done in the operating room. The patient has been noted to have PIN in the past, but none recently.    2. History of nephrolithiasis. He has had some documented  renal calculi that have been nonobstructive. He has had at least one clinical stone event. He has had microhematuria in the past and did have assessment. He has had flexible cystoscopy, cytology and upper tract imaging studies (2008).    3. Erectile dysfunction. The patient has taken oral agents with some improvement. He has noticed that they are not always consistent and at times have been less effective.Taught PEP, but really did not want to pursue the injections. Daily low-dose Cialis had worked fairly well until recently.    4. BPH and outlet obstruction. He had some progression in his symptoms. His stream is not bad, but he has definitely had an increase in nocturia to 4 times per night with increased urgency and frequency. AUA symptom score was 16. Of note, a postvoid residual was done which was approximately 1 ounce. Started of Flomax wih improvement. He subsequently stopped flomax without decline in voiding issues     NON-GU PMH: Lumbago with sciatica, unspecified side, I think his issue is more with back pain than with his prostate. - 2020 Encounter for general adult medical examination without abnormal findings, Encounter for preventive health examination - 2016 Anxiety, Anxiety - 2014 Personal history of other diseases of the circulatory system, History of hypertension - 2014 Personal history of other endocrine, nutritional and metabolic disease, History of hypercholesterolemia - 2014    FAMILY HISTORY: Acute Myocardial Infarction - Father, Mother Death In The Family Father - Father Death In The Family Mother - Mother Family Health Status Number - Mother   SOCIAL HISTORY: Marital Status: Married Preferred Language: English; Ethnicity: Not Hispanic Or Latino; Race: White Current Smoking Status: Patient has never smoked.  Drinks 3 drinks per day. Types of alcohol consumed: Wine. Moderate Drinker.  Drinks 3 caffeinated drinks per day. Patient's occupation is/was Retired.    VITAL  SIGNS:      07/17/2022 10:10 AM  BP 144/81 mmHg  Pulse 77 /min  Temperature 97.5 F / 36.3 C   GU PHYSICAL EXAMINATION:    Urethral Meatus: Normal size. No lesion, no wart, no discharge, no polyp. Normal location.   MULTI-SYSTEM PHYSICAL EXAMINATION:    Constitutional: Well-nourished. No physical deformities. Normally developed. Good grooming.  Neck: Neck symmetrical, not swollen. Normal tracheal position.  Respiratory: No labored breathing, no use of accessory muscles.   Skin: No paleness, no jaundice, no cyanosis. No lesion, no ulcer, no rash.  Neurologic / Psychiatric: Oriented to time, oriented to place, oriented to person. No depression, no anxiety, no agitation.  Eyes: Normal conjunctivae. Normal eyelids.  Ears, Nose, Mouth, and Throat: Left ear no scars, no lesions, no masses. Right ear no scars, no lesions, no masses. Nose no scars, no lesions, no masses. Normal hearing. Normal lips.  Musculoskeletal: Normal gait and station of head and neck.     Complexity of Data:  Source Of History:  Patient, Medical Record Summary  Records Review:  Pathology Reports, Previous Doctor Records, Previous Hospital Records, Previous Patient Records  Urine Test Review:   Urinalysis, Urine Culture  Urodynamics Review:   Review Bladder Scan  X-Ray Review: Renal Ultrasound: Reviewed Films.  C.T. Abdomen/Pelvis: Reviewed Films. Discussed With Patient. No hydronephrosis or ureteral/renal calculi. Question prostatic urethral caclulus.    12/17/16 09/20/15 04/20/14 04/09/13 04/23/12 05/18/11 10/04/10 03/23/10  PSA  Total PSA 9.46 ng/mL 9.42  8.78  9.70  9.24  9.46  10.08  8.78   Free PSA 2.17 ng/mL 1.64  1.80   1.85    1.6   % Free PSA 23 % PSA 17  21   20    18      04/19/14 11/11/07  Hormones  Testosterone, Total 391  445.0     PROCEDURES:         Flexible Cystoscopy - 52000  Risks, benefits, and some of the potential complications of the procedure were discussed at length with the patient  including infection, bleeding, voiding discomfort, urinary retention, fever, chills, sepsis, and others. All questions were answered. Informed consent was obtained. Sterile technique and intraurethral analgesia were used.  Meatus:  Normal size. Normal location. Normal condition.  Urethra:  No strictures.  External Sphincter:  Unable to traverse scope through sphincter due to stricture      The lower urinary tract was carefully examined to the level of urethral sphincter. At this point the scope could not traverse the strictured area. The procedure was well-tolerated and without complications. Antibiotic instructions were given. Instructions were given to call the office immediately for bloody urine, difficulty urinating, urinary retention, painful or frequent urination, fever, chills, nausea, vomiting or other illness. The patient stated that he understood these instructions and would comply with them.          C.T. Hematuria - 40981       Patient confirmed No Neulasta OnPro Device.        PVR Ultrasound - 19147  Notes: Patient voided after PVR for a specimen and voided 75 ml.  Scanned Volume: 102 cc         Omnipaque 300 - [id number] OMNIPAQUE 300 ( Bottle used with Injected and 25ml wasted)              Urinalysis w/Scope Dipstick Dipstick Cont'd Micro  Color: Yellow Bilirubin: Neg mg/dL WBC/hpf: NS (Not Seen)  Appearance: Clear Ketones: Neg mg/dL RBC/hpf: 10 - 82/NFA  Specific Gravity: 1.010 Blood: 3+ ery/uL Bacteria: Rare (0-9/hpf)  pH: 8.5 Protein: Trace mg/dL Cystals: NS (Not Seen)  Glucose: Neg mg/dL Urobilinogen: 0.2 mg/dL Casts: NS (Not Seen)    Nitrites: Neg Trichomonas: Not Present    Leukocyte Esterase: Neg leu/uL Mucous: Present      Epithelial Cells: NS (Not Seen)      Yeast: NS (Not Seen)      Sperm: Not Present    ASSESSMENT:      ICD-10 Details  1 GU:   Gross hematuria - R31.0 Undiagnosed New Problem  2   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, Worsening  3   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 Undiagnosed New Problem   PLAN:           Document Letter(s):  Created for Patient: Clinical Summary         Notes:   Gross hematuria with difficulty emptying bladder:  -Unable to fully evaluate lower urinary tract due to membranous urethral stricture  -CT scan shows calcification in prostatic urethra  concerning for possible stone  -Will follow-up CT report  -Risks and benefits of cystoscopy with urethral dilation and any other indicated procedures discussed with the patient in detail including but not limited to pain, bleeding, infection, damage to surrounding structures, need for additional treatment, need for foley catheter, injury to urethral sphincter   Schedule next available surgery

## 2022-07-24 NOTE — Transfer of Care (Signed)
Immediate Anesthesia Transfer of Care Note  Patient: Jason Blackwell  Procedure(s) Performed: CYSTOSCOPY WITH URETHRAL DILATATION TRANSURETHRAL RESECTION OF PROSTATE  Patient Location: PACU  Anesthesia Type:General  Level of Consciousness: awake and patient cooperative  Airway & Oxygen Therapy: Patient Spontanous Breathing and Patient connected to face mask  Post-op Assessment: Report given to RN and Post -op Vital signs reviewed and stable  Post vital signs: Reviewed and stable  Last Vitals:  Vitals Value Taken Time  BP    Temp    Pulse 79 07/24/22 1036  Resp 14 07/24/22 1036  SpO2 96 % 07/24/22 1036  Vitals shown include unvalidated device data.  Last Pain:  Vitals:   07/24/22 0800  PainSc: 0-No pain         Complications: No notable events documented.

## 2022-07-24 NOTE — Anesthesia Procedure Notes (Signed)
Procedure Name: LMA Insertion Date/Time: 07/24/2022 9:50 AM  Performed by: Vanessa Ball, CRNAPre-anesthesia Checklist: Emergency Drugs available, Patient identified, Suction available and Patient being monitored Patient Re-evaluated:Patient Re-evaluated prior to induction Oxygen Delivery Method: Circle system utilized Preoxygenation: Pre-oxygenation with 100% oxygen Induction Type: IV induction Ventilation: Mask ventilation without difficulty LMA: LMA inserted LMA Size: 4.0 Number of attempts: 1 Placement Confirmation: positive ETCO2 and breath sounds checked- equal and bilateral Tube secured with: Tape Dental Injury: Teeth and Oropharynx as per pre-operative assessment

## 2022-07-24 NOTE — Discharge Instructions (Addendum)
Cystoscopy with Urethral Dilation and Removal of Prostate Stones patient instructions  Following a cystoscopy, a catheter (a flexible rubber tube) is sometimes left in place to empty the bladder. This may cause some discomfort or a feeling that you need to urinate. Your doctor determines the period of time that the catheter will be left in place. You may have bloody urine for two to three days (Call your doctor if the amount of bleeding increases or does not subside).  You may pass blood clots in your urine, especially if you had a biopsy. It is not unusual to pass small blood clots and have some bloody urine a couple of weeks after your cystoscopy. Again, call your doctor if the bleeding does not subside. You may have: Dysuria (painful urination) Frequency (urinating often) Urgency (strong desire to urinate)  These symptoms are common especially if medicine is instilled into the bladder or a ureteral stent is placed. Avoiding alcohol and caffeine, such as coffee, tea, and chocolate, may help relieve these symptoms. Drink plenty of water, unless otherwise instructed. Your doctor may also prescribe an antibiotic or other medicine to reduce these symptoms.  Cystoscopy results are available soon after the procedure; biopsy results usually take two to four days. Your doctor will discuss the results of your exam with you. Before you go home, you will be given specific instructions for follow-up care. Special Instructions:   If you are going home with a catheter in place do not take a tub bath until removed by your doctor.   You may resume your normal activities.   Do not drive or operate machinery if you are taking narcotic pain medicine.   Be sure to keep all follow-up appointments with your doctor.   Call Your Doctor If: The catheter is not draining You have severe pain You are unable to urinate You have a fever over 101 You have severe bleeding

## 2022-07-24 NOTE — Anesthesia Postprocedure Evaluation (Signed)
Anesthesia Post Note  Patient: Jason Blackwell  Procedure(s) Performed: CYSTOSCOPY WITH URETHRAL DILATATION TRANSURETHRAL RESECTION OF PROSTATE     Patient location during evaluation: PACU Anesthesia Type: General Level of consciousness: awake and alert Pain management: pain level controlled Vital Signs Assessment: post-procedure vital signs reviewed and stable Respiratory status: spontaneous breathing, nonlabored ventilation and respiratory function stable Cardiovascular status: blood pressure returned to baseline Postop Assessment: no apparent nausea or vomiting Anesthetic complications: no   No notable events documented.  Last Vitals:  Vitals:   07/24/22 1045 07/24/22 1100  BP: 138/68 (!) 148/79  Pulse: 75 77  Resp: 15 16  Temp:    SpO2: 96% 96%    Last Pain:  Vitals:   07/24/22 1100  PainSc: 5                  Shanda Howells

## 2022-07-24 NOTE — Op Note (Signed)
Operative Note  Preoperative diagnosis:  1.  Membranous urethral stricture 2.  Prostatic calculi 3.  BPH with BOO  Postoperative diagnosis: 1.  Membranous urethral stricture 2.  Prostatic calculi 3.  BPH with BOO  Procedure(s): 1.  Cystoscopy with urethral dilation 2.  Holmium laser lithotripsy of prostatic urethral calculi 3.  TURP of prostatic regrowth   Surgeon: Kasandra Knudsen, MD  Assistants:  None  Anesthesia:  General  Complications:  None  EBL:  minimal  Specimens: 1. Prostate chips  Drains/Catheters: 1.  18Fr silicone foley 30cc sterile water  Intraoperative findings:   Normal anterior urethra Membranous urethral stricture appears approx 6Fr Prostatic urethral calculi right prostatic lobe. Left prostatic lobe regrowth and anteriorly. Bladder neck wide open. Bilateral orthotopic Uos Moderate trabeculation with large capacity bladder  Indication:  Jason Blackwell is a 77 y.o. male with history of BPH with bladder outlet obstruction who underwent TURP in 2021 found to have urethral stricture on diagnostic cystoscopy that was performed for gross hematuria.  Description of procedure:  After risks and benefits of the procedure were discussed with the patient, informed consent was obtained.  The patient was taken to the operating room placed in the supine position.  Anesthesia was induced and antibiotics were administered.  The patient was then repositioned in the dorsolithotomy position.  He was prepped and draped in the usual sterile fashion.  A timeout was performed.  A 21 French rigid cystoscope was placed in the with meatus and advanced to the membranous urethra at which time the stricture was encountered again.  The stricture appeared to be approximately 6 [place] in diameter.  A 0.38 sensor wire was advanced through the cystoscope and into the bladder with fluoroscopic guidance.  The cystoscope was removed.  Next the Ultrex balloon dilator was placed over the  wire and radiopaque markers were used to span the area of the stricture.  The balloon was inflated to 50 mmHg.  It was kept in place for 3 minutes.  It was then deflated and removed.  Cystoscopy then took place alongside the wire was easily able to traverse into the bladder.  At this point in time there were several calculi adherent to the right prostatic lobe.  There was some mild regrowth of the left and anterior prostatic lobes.  Patient had orthotopic bilateral ureteral ureteral orifices.  No bladder tumor was seen but there was moderate trabeculation.A 365 micron holmium laser fiber was then used to remove the stone from the right prostatic lobe.    The cystoscope was removed and the resectoscope was advanced into the urethra meatus and into the bladder with the visual obturator.  The visual obturator was removed and the working element with the bipolar loop was assembled.  The area where the stone was adherent was resected and cauterized.  The left prostatic lobe regrowth was also resected.  Bipolar electrocautery was used to achieve hemostasis.  The chips were removed with a Toomey syringe.  Hemostasis was adequate with the irrigant turned off.  The patient's bladder was decompressed and the resectoscope was removed.  An 24 French silicone Foley catheter was placed out difficulty.  30 cc sterile water was used to inflate the balloon.  The patient emerged from anesthesia transferred back in stable condition.  Plan:  Void trial in office in 3 days

## 2022-07-24 NOTE — Interval H&P Note (Signed)
History and Physical Interval Note:  07/24/2022 9:37 AM  Jason Blackwell  has presented today for surgery, with the diagnosis of GROSS HEMATURIA, URETHRAL STRICTURE.  The various methods of treatment have been discussed with the patient and family. After consideration of risks, benefits and other options for treatment, the patient has consented to  Procedure(s): CYSTOSCOPY WITH URETHRAL DILATATION (N/A) TRANSURETHRAL RESECTION OF BLADDER TUMOR (TURBT) (N/A) as a surgical intervention.  The patient's history has been reviewed, patient examined, no change in status, stable for surgery.  I have reviewed the patient's chart and labs.  Questions were answered to the patient's satisfaction.     Munir Victorian D Yalissa Fink

## 2022-07-25 ENCOUNTER — Encounter (HOSPITAL_COMMUNITY): Payer: Self-pay | Admitting: Urology

## 2022-07-25 ENCOUNTER — Ambulatory Visit: Payer: PPO | Admitting: Orthopaedic Surgery

## 2022-07-25 LAB — SURGICAL PATHOLOGY

## 2022-08-08 ENCOUNTER — Ambulatory Visit: Payer: PPO | Admitting: Orthopaedic Surgery

## 2022-08-08 ENCOUNTER — Telehealth: Payer: Self-pay | Admitting: *Deleted

## 2022-08-08 ENCOUNTER — Encounter: Payer: Self-pay | Admitting: Orthopaedic Surgery

## 2022-08-08 DIAGNOSIS — Z96652 Presence of left artificial knee joint: Secondary | ICD-10-CM | POA: Diagnosis not present

## 2022-08-08 DIAGNOSIS — Z96651 Presence of right artificial knee joint: Secondary | ICD-10-CM

## 2022-08-08 NOTE — Progress Notes (Signed)
The patient is well-known to me.  We replaced his right knee in July of last year and his left knee in November of last year.  He says the left knee is actually doing better in the right knee and he is trying to be as active as possible.  He has been going to physical therapy for balance and coordination and core strengthening.  It is really been more about core strengthening.  He is doing well overall and has had a urologic surgery earlier this year by Dr. Arita Miss and he is very satisfied with that surgery.  Both knees still lacks full extension by about 5 degrees but the knees have full flexion.  They both feel ligamentously stable and have some swelling to be expected.  From my standpoint he will continue to work on core strengthening as well as balance and coordination.  Will see him back in 4 months which will be the 1 year standpoint from his left knee replacement.  Will have a standing AP and lateral of both knees at that visit.

## 2022-08-08 NOTE — Telephone Encounter (Signed)
1 year in office meeting completed for Right total knee arthroplasty.

## 2022-08-08 NOTE — Telephone Encounter (Signed)
Error

## 2022-08-24 DIAGNOSIS — R3912 Poor urinary stream: Secondary | ICD-10-CM | POA: Diagnosis not present

## 2022-08-24 DIAGNOSIS — R310 Gross hematuria: Secondary | ICD-10-CM | POA: Diagnosis not present

## 2022-08-24 DIAGNOSIS — N401 Enlarged prostate with lower urinary tract symptoms: Secondary | ICD-10-CM | POA: Diagnosis not present

## 2022-09-12 ENCOUNTER — Telehealth: Payer: Self-pay | Admitting: Cardiovascular Disease

## 2022-09-12 DIAGNOSIS — Z0000 Encounter for general adult medical examination without abnormal findings: Secondary | ICD-10-CM | POA: Diagnosis not present

## 2022-09-12 DIAGNOSIS — C44219 Basal cell carcinoma of skin of left ear and external auricular canal: Secondary | ICD-10-CM | POA: Diagnosis not present

## 2022-09-12 DIAGNOSIS — E785 Hyperlipidemia, unspecified: Secondary | ICD-10-CM

## 2022-09-12 DIAGNOSIS — D485 Neoplasm of uncertain behavior of skin: Secondary | ICD-10-CM | POA: Diagnosis not present

## 2022-09-12 DIAGNOSIS — L538 Other specified erythematous conditions: Secondary | ICD-10-CM | POA: Diagnosis not present

## 2022-09-12 DIAGNOSIS — I2510 Atherosclerotic heart disease of native coronary artery without angina pectoris: Secondary | ICD-10-CM

## 2022-09-12 MED ORDER — REPATHA SURECLICK 140 MG/ML ~~LOC~~ SOAJ
140.0000 mg | SUBCUTANEOUS | 3 refills | Status: DC
Start: 2022-09-12 — End: 2022-12-21

## 2022-09-12 NOTE — Telephone Encounter (Signed)
*  STAT* If patient is at the pharmacy, call can be transferred to refill team.   1. Which medications need to be refilled? (please list name of each medication and dose if known) Evolocumab (REPATHA SURECLICK) 140 MG/ML SOAJ      4. Which pharmacy/location (including street and city if local pharmacy) is medication to be sent to? [id number] - [place], [place] [phone]     5. Do they need a 30 day or 90 day supply? 90

## 2022-09-12 NOTE — Telephone Encounter (Signed)
Refill sent in

## 2022-10-02 DIAGNOSIS — Z23 Encounter for immunization: Secondary | ICD-10-CM | POA: Diagnosis not present

## 2022-10-17 DIAGNOSIS — D2262 Melanocytic nevi of left upper limb, including shoulder: Secondary | ICD-10-CM | POA: Diagnosis not present

## 2022-10-17 DIAGNOSIS — D2261 Melanocytic nevi of right upper limb, including shoulder: Secondary | ICD-10-CM | POA: Diagnosis not present

## 2022-10-17 DIAGNOSIS — D2271 Melanocytic nevi of right lower limb, including hip: Secondary | ICD-10-CM | POA: Diagnosis not present

## 2022-10-17 DIAGNOSIS — L570 Actinic keratosis: Secondary | ICD-10-CM | POA: Diagnosis not present

## 2022-10-17 DIAGNOSIS — Z85820 Personal history of malignant melanoma of skin: Secondary | ICD-10-CM | POA: Diagnosis not present

## 2022-10-17 DIAGNOSIS — D485 Neoplasm of uncertain behavior of skin: Secondary | ICD-10-CM | POA: Diagnosis not present

## 2022-10-17 DIAGNOSIS — D225 Melanocytic nevi of trunk: Secondary | ICD-10-CM | POA: Diagnosis not present

## 2022-10-17 DIAGNOSIS — Z85828 Personal history of other malignant neoplasm of skin: Secondary | ICD-10-CM | POA: Diagnosis not present

## 2022-10-24 DIAGNOSIS — C44219 Basal cell carcinoma of skin of left ear and external auricular canal: Secondary | ICD-10-CM | POA: Diagnosis not present

## 2022-11-27 DIAGNOSIS — K5730 Diverticulosis of large intestine without perforation or abscess without bleeding: Secondary | ICD-10-CM | POA: Diagnosis not present

## 2022-11-27 DIAGNOSIS — K648 Other hemorrhoids: Secondary | ICD-10-CM | POA: Diagnosis not present

## 2022-11-27 DIAGNOSIS — D125 Benign neoplasm of sigmoid colon: Secondary | ICD-10-CM | POA: Diagnosis not present

## 2022-11-27 DIAGNOSIS — Z860101 Personal history of adenomatous and serrated colon polyps: Secondary | ICD-10-CM | POA: Diagnosis not present

## 2022-11-27 DIAGNOSIS — Z09 Encounter for follow-up examination after completed treatment for conditions other than malignant neoplasm: Secondary | ICD-10-CM | POA: Diagnosis not present

## 2022-11-29 DIAGNOSIS — D125 Benign neoplasm of sigmoid colon: Secondary | ICD-10-CM | POA: Diagnosis not present

## 2022-12-05 ENCOUNTER — Emergency Department (HOSPITAL_COMMUNITY)
Admission: EM | Admit: 2022-12-05 | Discharge: 2022-12-05 | Disposition: A | Discharge status: Home / Self Care | Payer: PPO | Attending: Emergency Medicine | Admitting: Emergency Medicine

## 2022-12-05 ENCOUNTER — Encounter (HOSPITAL_COMMUNITY): Payer: Self-pay

## 2022-12-05 DIAGNOSIS — Z91040 Latex allergy status: Secondary | ICD-10-CM | POA: Diagnosis not present

## 2022-12-05 DIAGNOSIS — Z79899 Other long term (current) drug therapy: Secondary | ICD-10-CM | POA: Diagnosis not present

## 2022-12-05 DIAGNOSIS — I10 Essential (primary) hypertension: Secondary | ICD-10-CM | POA: Insufficient documentation

## 2022-12-05 DIAGNOSIS — E876 Hypokalemia: Secondary | ICD-10-CM | POA: Diagnosis not present

## 2022-12-05 DIAGNOSIS — Z7982 Long term (current) use of aspirin: Secondary | ICD-10-CM | POA: Insufficient documentation

## 2022-12-05 DIAGNOSIS — E871 Hypo-osmolality and hyponatremia: Secondary | ICD-10-CM | POA: Insufficient documentation

## 2022-12-05 DIAGNOSIS — R3914 Feeling of incomplete bladder emptying: Secondary | ICD-10-CM | POA: Diagnosis not present

## 2022-12-05 DIAGNOSIS — N401 Enlarged prostate with lower urinary tract symptoms: Secondary | ICD-10-CM | POA: Diagnosis not present

## 2022-12-05 DIAGNOSIS — R351 Nocturia: Secondary | ICD-10-CM | POA: Diagnosis not present

## 2022-12-05 DIAGNOSIS — R310 Gross hematuria: Secondary | ICD-10-CM | POA: Diagnosis not present

## 2022-12-05 DIAGNOSIS — Z85828 Personal history of other malignant neoplasm of skin: Secondary | ICD-10-CM | POA: Insufficient documentation

## 2022-12-05 DIAGNOSIS — N35012 Post-traumatic membranous urethral stricture: Secondary | ICD-10-CM | POA: Diagnosis not present

## 2022-12-05 DIAGNOSIS — R202 Paresthesia of skin: Secondary | ICD-10-CM | POA: Diagnosis not present

## 2022-12-05 DIAGNOSIS — I2510 Atherosclerotic heart disease of native coronary artery without angina pectoris: Secondary | ICD-10-CM | POA: Diagnosis not present

## 2022-12-05 LAB — BASIC METABOLIC PANEL
Anion gap: 12 (ref 5–15)
BUN: 9 mg/dL (ref 8–23)
CO2: 26 mmol/L (ref 22–32)
Calcium: 8.7 mg/dL — ABNORMAL LOW (ref 8.9–10.3)
Chloride: 92 mmol/L — ABNORMAL LOW (ref 98–111)
Creatinine, Ser: 0.5 mg/dL — ABNORMAL LOW (ref 0.61–1.24)
GFR, Estimated: >60 mL/min (ref >60)
Glucose, Bld: 127 mg/dL — ABNORMAL HIGH (ref 70–99)
Potassium: 2.9 mmol/L — ABNORMAL LOW (ref 3.5–5.1)
Sodium: 130 mmol/L — ABNORMAL LOW (ref 135–145)

## 2022-12-05 LAB — MAGNESIUM: Magnesium: 1.8 mg/dL (ref 1.7–2.4)

## 2022-12-05 MED ORDER — LACTATED RINGERS IV BOLUS
1000.0000 mL | Freq: Once | INTRAVENOUS | Status: AC
  Administered 2022-12-05: 1000 mL via INTRAVENOUS

## 2022-12-05 MED ORDER — POTASSIUM CHLORIDE CRYS ER 20 MEQ PO TBCR: 20.0000 meq | EXTENDED_RELEASE_TABLET | Freq: Every day | ORAL | 0 refills | Status: AC

## 2022-12-05 MED ORDER — POTASSIUM CHLORIDE 10 MEQ/100ML IV SOLN
10.0000 meq | Freq: Once | INTRAVENOUS | Status: AC
  Administered 2022-12-05: 10 meq via INTRAVENOUS
  Filled 2022-12-05: qty 100

## 2022-12-05 NOTE — ED Provider Notes (Signed)
[place] EMERGENCY DEPARTMENT AT [facility] Provider Note   CSN: [id number] Arrival date & time: 12/05/22  4098     History  Chief Complaint  Patient presents with   Weakness    Jason Blackwell is a 77 y.o. male.   Weakness Patient presents with right arm change in sensation.  Has had for around 3 days now.  Began on Monday with today being Wednesday.  States it is only on the arm and does not involve the hand.  Appears to be somewhat on the lateral aspect and anterior aspect of the arm.  States that feels different but still has a feeling.  Also states when he tries to hold something that we will do some shaking.  Did have colonoscopy couple weeks ago.  Had a headache at that time but that has resolved.  States he had a headache after the colonoscopy and he called and was told to drink more fluids.    Past Medical History:  Diagnosis Date   Arthritis    right knee   BPH (benign prostatic hypertrophy)    TURP done   Cancer (HCC)    skin   Colon adenoma    Complication of anesthesia    Respiratory issues with Succinylcholine   Coronary artery disease    3 stent   Difficult intubation    Ejection fraction    Erectile dysfunction    Fecal incontinence    GERD (gastroesophageal reflux disease)    Heart murmur 2008   LVH, diastolic dysfunction, aortic sclerosis   Hemorrhoids    History of kidney stones    Hyperlipidemia    Hypertension    Insomnia    Melanoma (HCC)    Scalp    Home Medications Prior to Admission medications   Medication Sig Start Date End Date Taking? Authorizing Provider  potassium chloride SA (KLOR-CON M) 20 MEQ tablet Take 1 tablet (20 mEq total) by mouth daily. 12/05/22  Yes Benjiman Core, MD  acetaminophen (TYLENOL) 500 MG tablet Take 1,000 mg by mouth 2 (two) times daily.    [provider]  amLODipine (NORVASC) 10 MG tablet Take 10 mg by mouth daily.    [provider]  aspirin EC 81 MG tablet Take 81  mg by mouth daily. Swallow whole.    [provider]  chlorthalidone (HYGROTON) 25 MG tablet TAKE 1 TABLET (25 MG TOTAL) BY MOUTH DAILY. 12/17/17   Lyn Records, MD  Evolocumab (REPATHA SURECLICK) 140 MG/ML SOAJ Inject 140 mg into the skin every 14 (fourteen) days. 09/12/22   Kathleene Hazel, MD  famotidine (PEPCID) 20 MG tablet Take 20 mg by mouth every evening.    [provider]  hydrocortisone cream 1 % Apply 1 Application topically daily as needed for itching.    [provider]  loratadine (CLARITIN) 10 MG tablet TAKE 1 TABLET BY MOUTH EVERY DAY Patient taking differently: Take 10 mg by mouth daily as needed for allergies. 04/27/22   Leslye Peer, MD  omeprazole (PRILOSEC) 40 MG capsule Take 40 mg by mouth in the morning and at bedtime. 09/11/20   [provider]  OVER THE COUNTER MEDICATION Take 3 capsules by mouth daily. Balance of nature veggies    [provider]  OVER THE COUNTER MEDICATION Take 3 capsules by mouth daily. Balance of nature fruits    [provider]  OVER THE COUNTER MEDICATION Take 2 capsules by mouth at bedtime. Relaxium sleep  [provider]  tadalafil (CIALIS) 5 MG tablet Take 5 mg by mouth daily.    [provider]  telmisartan (MICARDIS) 80 MG tablet Take 80 mg by mouth daily. 03/20/19   [provider]      Allergies    Latex, Statins, Succinylcholine chloride, and Sulfa antibiotics    Review of Systems   Review of Systems  Neurological:  Positive for weakness.    Physical Exam Updated Vital Signs BP (!) 146/79   Pulse 67   Temp 98.1 F (36.7 C) (Oral)   Resp 16   SpO2 95%  Physical Exam Vitals and nursing note reviewed.  HENT:     Head: Atraumatic.  Eyes:     Pupils: Pupils are equal, round, and reactive to light.  Pulmonary:     Breath sounds: No wheezing.  Abdominal:     Tenderness: There is no abdominal tenderness.  Musculoskeletal:     Cervical  back: Neck supple. No tenderness.  Skin:    General: Skin is warm.  Neurological:     Mental Status: He is alert and oriented to person, place, and time.     Comments: Finger-nose intact bilaterally.  Sensation intact over radial median and ulnar distribution on right hand.  Good strength in hand.  Good flexion and extension of the wrist elbow and shoulder.  May have some change in sensation along the distribution along the radial aspect of the forearm and upper arm.  However sensation is intact.  States that if anything feels more sensitive.  Strong pulse.     ED Results / Procedures / Treatments   Labs (all labs ordered are listed, but only abnormal results are displayed) Labs Reviewed  BASIC METABOLIC PANEL - Abnormal; Notable for the following components:      Result Value   Sodium 130 (*)    Potassium 2.9 (*)    Chloride 92 (*)    Glucose, Bld 127 (*)    Creatinine, Ser 0.50 (*)    Calcium 8.7 (*)    All other components within normal limits  MAGNESIUM    EKG None  Radiology No results found.  Procedures Procedures    Medications Ordered in ED Medications  potassium chloride 10 mEq in 100 mL IVPB (0 mEq Intravenous Stopped 12/05/22 1316)  lactated ringers bolus 1,000 mL (0 mLs Intravenous Stopped 12/05/22 1323)    ED Course/ Medical Decision Making/ A&P                                 Medical Decision Making Amount and/or Complexity of Data Reviewed Labs: ordered.  Risk Prescription drug management.    patient with paresthesia on right upper extremity.  Does have previous headache.  No neck pain.  Overall reassuring exam.  No deficits.  Differential diagnosis includes causes such as paresthesias, radiculopathy.  Discussed with patient and patient reviewed his head CT and cervical spine CT.  Overall I think the risk of stroke is low.  Will get blood work to look for electrolyte abnormality.  Likely neurology follow-up.   Does have hyponatremia and  hypokalemia.  Supplemented both.  Feeling a little better.  However does not want further workup for the localizing findings.  Has neurology that he can follow-up with.  Will discharge home.        Final Clinical Impression(s) / ED Diagnoses Final diagnoses:  Hyponatremia  Hypokalemia  Paresthesia  Rx / DC Orders ED Discharge Orders          Ordered    Ambulatory referral to Neurology       Comments: An appointment is requested in approximately: 2 weeks   12/05/22 1319    potassium chloride SA (KLOR-CON M) 20 MEQ tablet  Daily        12/05/22 1319              Benjiman Core, MD 12/05/22 1512

## 2022-12-05 NOTE — ED Triage Notes (Signed)
Pt c/o weakness in R arm x2 days.  Pt reports arm "shakes" when he tries to grip an object or pick something up. Pt denies injury.

## 2022-12-06 ENCOUNTER — Other Ambulatory Visit (INDEPENDENT_AMBULATORY_CARE_PROVIDER_SITE_OTHER): Payer: PPO

## 2022-12-06 ENCOUNTER — Ambulatory Visit: Payer: PPO | Admitting: Orthopaedic Surgery

## 2022-12-06 DIAGNOSIS — Z96651 Presence of right artificial knee joint: Secondary | ICD-10-CM

## 2022-12-06 DIAGNOSIS — Z96652 Presence of left artificial knee joint: Secondary | ICD-10-CM

## 2022-12-06 NOTE — Progress Notes (Signed)
The patient is a pleasant 77 year old gentleman who is in follow-up for knee replacements.  We replaced his right knee in July 2023 and his left knee and November 2023.  It has now been a year.  He reports he is doing well.  He says he only has some discomfort when he is walking long uneven surfaces and some soreness at times.  He just takes Tylenol for pain.  He is very active.  His wife is with him today as well.  Examination of both knees shows that we can almost fully extend and fully flex both knees.  They both feel ligamentously stable.  Both knees have well-healed surgical incisions and no significant swelling.  X-rays of both knees show well-seated total knee arthroplasties with no complicating features.  At this point follow-up for his knees can be as needed.  If he does develop any issues with his knees or other musculoskeletal issues he knows to let us know.  All questions and concerns were answered and addressed.

## 2022-12-07 DIAGNOSIS — E876 Hypokalemia: Secondary | ICD-10-CM | POA: Diagnosis not present

## 2022-12-07 DIAGNOSIS — E871 Hypo-osmolality and hyponatremia: Secondary | ICD-10-CM | POA: Diagnosis not present

## 2022-12-07 DIAGNOSIS — R253 Fasciculation: Secondary | ICD-10-CM | POA: Diagnosis not present

## 2022-12-10 DIAGNOSIS — N35012 Post-traumatic membranous urethral stricture: Secondary | ICD-10-CM | POA: Diagnosis not present

## 2022-12-10 DIAGNOSIS — R3914 Feeling of incomplete bladder emptying: Secondary | ICD-10-CM | POA: Diagnosis not present

## 2022-12-10 DIAGNOSIS — R310 Gross hematuria: Secondary | ICD-10-CM | POA: Diagnosis not present

## 2022-12-10 DIAGNOSIS — N401 Enlarged prostate with lower urinary tract symptoms: Secondary | ICD-10-CM | POA: Diagnosis not present

## 2022-12-19 ENCOUNTER — Telehealth: Payer: Self-pay | Admitting: Pharmacist

## 2022-12-19 DIAGNOSIS — E785 Hyperlipidemia, unspecified: Secondary | ICD-10-CM

## 2022-12-19 DIAGNOSIS — I2510 Atherosclerotic heart disease of native coronary artery without angina pectoris: Secondary | ICD-10-CM

## 2022-12-19 NOTE — Telephone Encounter (Signed)
Called pt to confirm why he was calling in. Pt aware Aundra Millet is no longer with our office. He is calling in about his [employer] assistance. Says Aundra Millet was helping him earlier this year and last with this. He mentioned [employer]. Aware forwarding to our medication assistance team who will help him going forward with this. Pt understands they will reach out to him.

## 2022-12-19 NOTE — Telephone Encounter (Signed)
Patient is calling and requesting to speak with [facility]

## 2022-12-21 MED ORDER — REPATHA SURECLICK 140 MG/ML ~~LOC~~ SOAJ: 140.0000 mg | SUBCUTANEOUS | 3 refills | Status: DC

## 2022-12-21 NOTE — Telephone Encounter (Signed)
Patient;s [employer] grant is still active until 02/02/23.  ID [id number]  Card Status Active  BIN [id number]  PCN [id number]  PC Group [id number]  Called patient and let him know and sent info to pharmacy

## 2022-12-24 ENCOUNTER — Other Ambulatory Visit: Payer: Self-pay | Admitting: Internal Medicine

## 2022-12-24 DIAGNOSIS — R531 Weakness: Secondary | ICD-10-CM

## 2023-01-02 DIAGNOSIS — I672 Cerebral atherosclerosis: Secondary | ICD-10-CM | POA: Diagnosis not present

## 2023-01-02 DIAGNOSIS — R531 Weakness: Secondary | ICD-10-CM | POA: Diagnosis not present

## 2023-01-02 DIAGNOSIS — R011 Cardiac murmur, unspecified: Secondary | ICD-10-CM | POA: Diagnosis not present

## 2023-01-05 ENCOUNTER — Encounter (HOSPITAL_COMMUNITY): Payer: Self-pay

## 2023-01-05 ENCOUNTER — Emergency Department (HOSPITAL_COMMUNITY)
Admission: EM | Admit: 2023-01-05 | Discharge: 2023-01-05 | Disposition: A | Discharge status: Home / Self Care | Payer: PPO | Attending: Emergency Medicine | Admitting: Emergency Medicine

## 2023-01-05 ENCOUNTER — Emergency Department (HOSPITAL_COMMUNITY): Payer: PPO

## 2023-01-05 ENCOUNTER — Ambulatory Visit
Admission: RE | Admit: 2023-01-05 | Discharge: 2023-01-05 | Disposition: A | Discharge status: Home / Self Care | Payer: PPO | Source: Ambulatory Visit | Attending: Internal Medicine | Admitting: Internal Medicine

## 2023-01-05 ENCOUNTER — Other Ambulatory Visit: Payer: Self-pay

## 2023-01-05 DIAGNOSIS — G319 Degenerative disease of nervous system, unspecified: Secondary | ICD-10-CM | POA: Diagnosis not present

## 2023-01-05 DIAGNOSIS — I2510 Atherosclerotic heart disease of native coronary artery without angina pectoris: Secondary | ICD-10-CM | POA: Diagnosis not present

## 2023-01-05 DIAGNOSIS — I6200 Nontraumatic subdural hemorrhage, unspecified: Secondary | ICD-10-CM | POA: Diagnosis not present

## 2023-01-05 DIAGNOSIS — I10 Essential (primary) hypertension: Secondary | ICD-10-CM | POA: Insufficient documentation

## 2023-01-05 DIAGNOSIS — I6201 Nontraumatic acute subdural hemorrhage: Secondary | ICD-10-CM | POA: Diagnosis not present

## 2023-01-05 DIAGNOSIS — I6203 Nontraumatic chronic subdural hemorrhage: Secondary | ICD-10-CM | POA: Diagnosis not present

## 2023-01-05 DIAGNOSIS — Z7982 Long term (current) use of aspirin: Secondary | ICD-10-CM | POA: Diagnosis not present

## 2023-01-05 DIAGNOSIS — R531 Weakness: Secondary | ICD-10-CM | POA: Diagnosis not present

## 2023-01-05 DIAGNOSIS — I6782 Cerebral ischemia: Secondary | ICD-10-CM | POA: Diagnosis not present

## 2023-01-05 DIAGNOSIS — Z91040 Latex allergy status: Secondary | ICD-10-CM | POA: Diagnosis not present

## 2023-01-05 DIAGNOSIS — R202 Paresthesia of skin: Secondary | ICD-10-CM | POA: Diagnosis present

## 2023-01-05 DIAGNOSIS — S065X0A Traumatic subdural hemorrhage without loss of consciousness, initial encounter: Secondary | ICD-10-CM | POA: Diagnosis not present

## 2023-01-05 NOTE — Discharge Instructions (Signed)
You were seen for your subdural hematoma in the emergency department.   Follow-up with Dr Conchita Paris in 1-2 weeks.   Return immediately to the emergency department if you experience any of the following: severe headache, numbness or weakness, difficulty speaking, seizures, or any other concerning symptoms.    Thank you for visiting our Emergency Department. It was a pleasure taking care of you today.

## 2023-01-05 NOTE — ED Provider Notes (Signed)
[place] EMERGENCY DEPARTMENT AT [facility] Provider Note   CSN: [id number] Arrival date & time: 01/05/23  1611     History  Chief Complaint  Patient presents with   Subdural Hematoma    Jason Blackwell is a 77 y.o. male.  77 year old male with history of hypertension, hyperlipidemia, and CAD on aspirin who presents to the emergency department with an abnormal MRI.  8 days ago the patient had an episode of numbness and tingling of his right arm that lasted 20 minutes.  Says that it resolved and then recurred the next day.  Also felt like his right leg may have been weak for short period of time as well.  Told his primary doctor who initiated the TIA workup for him as an outpatient.  The symptoms have all resolved.  He had an MRI today that showed a subdural collection that was concerning for subdural hematoma.  Also showed that he may potentially have a subarachnoid collection as well.  Denies any falls.  No blood thinners aside from aspirin.  Not having a significant headache at this time.       Home Medications Prior to Admission medications   Medication Sig Start Date End Date Taking? Authorizing Provider  acetaminophen (TYLENOL) 500 MG tablet Take 1,000 mg by mouth 2 (two) times daily.    [provider]  amLODipine (NORVASC) 10 MG tablet Take 10 mg by mouth daily.    [provider]  aspirin EC 81 MG tablet Take 81 mg by mouth daily. Swallow whole.    [provider]  chlorthalidone (HYGROTON) 25 MG tablet TAKE 1 TABLET (25 MG TOTAL) BY MOUTH DAILY. 12/17/17   Lyn Records, MD  Evolocumab (REPATHA SURECLICK) 140 MG/ML SOAJ Inject 140 mg into the skin every 14 (fourteen) days. 12/21/22   Kathleene Hazel, MD  famotidine (PEPCID) 20 MG tablet Take 20 mg by mouth every evening.    [provider]  hydrocortisone cream 1 % Apply 1 Application topically daily as needed for itching.    [provider]  loratadine  (CLARITIN) 10 MG tablet TAKE 1 TABLET BY MOUTH EVERY DAY Patient taking differently: Take 10 mg by mouth daily as needed for allergies. 04/27/22   Leslye Peer, MD  omeprazole (PRILOSEC) 40 MG capsule Take 40 mg by mouth in the morning and at bedtime. 09/11/20   [provider]  OVER THE COUNTER MEDICATION Take 3 capsules by mouth daily. Balance of nature veggies    [provider]  OVER THE COUNTER MEDICATION Take 3 capsules by mouth daily. Balance of nature fruits    [provider]  OVER THE COUNTER MEDICATION Take 2 capsules by mouth at bedtime. Relaxium sleep    [provider]  potassium chloride SA (KLOR-CON M) 20 MEQ tablet Take 1 tablet (20 mEq total) by mouth daily. 12/05/22   Benjiman Core, MD  tadalafil (CIALIS) 5 MG tablet Take 5 mg by mouth daily.    [provider]  telmisartan (MICARDIS) 80 MG tablet Take 80 mg by mouth daily. 03/20/19   [provider]      Allergies    Latex, Statins, Succinylcholine chloride, and Sulfa antibiotics    Review of Systems   Review of Systems  Physical Exam Updated Vital Signs BP (!) 146/82   Pulse 85   Temp 98.5 F (36.9 C)   Resp 16   Ht 5\' 10"  (1.778 m)   Wt 104.3 kg  SpO2 99%   BMI 33.00 kg/m  Physical Exam Vitals and nursing note reviewed.  Constitutional:      General: He is not in acute distress.    Appearance: He is well-developed.  HENT:     Head: Normocephalic and atraumatic.     Right Ear: External ear normal.     Left Ear: External ear normal.     Nose: Nose normal.  Eyes:     Extraocular Movements: Extraocular movements intact.     Conjunctiva/sclera: Conjunctivae normal.     Pupils: Pupils are equal, round, and reactive to light.  Pulmonary:     Effort: Pulmonary effort is normal. No respiratory distress.  Musculoskeletal:     Cervical back: Normal range of motion and neck supple.     Right lower leg: No edema.     Left lower leg: No edema.  Skin:     General: Skin is warm and dry.  Neurological:     Mental Status: He is alert. Mental status is at baseline.     Comments: Level of Consciousness: Alert  LOC Commands: Opens and Closes Eyes and Hands on command  Best Gaze: Horizontal ocular movements intact  Visual Fields: No visual field loss  Facial Palsy: None  L Upper Extremity Motor: No drift after 10 seconds  R Upper Extremity Motor: No drift after 10 seconds  L Lower extremity Motor: No drift after 5 seconds  R Lower extremity Motor: No drift after 5 seconds  Ataxia: Absent  Sensory: Intact sensation to light touch on face, arms, trunk, and legs bilaterally  Best Language: No aphasia  Dysarthria: No dysarthria  Neglect: No visual or sensory neglect    Psychiatric:        Mood and Affect: Mood normal.        Behavior: Behavior normal.     ED Results / Procedures / Treatments   Labs (all labs ordered are listed, but only abnormal results are displayed) Labs Reviewed - No data to display  EKG None  Radiology CT Head Wo Contrast Result Date: 01/05/2023 CLINICAL DATA:  Provided history: Subdural hematoma. EXAM: CT HEAD WITHOUT CONTRAST TECHNIQUE: Contiguous axial images were obtained from the base of the skull through the vertex without intravenous contrast. RADIATION DOSE REDUCTION: This exam was performed according to the departmental dose-optimization program which includes automated exposure control, adjustment of the mA and/or kV according to patient size and/or use of iterative reconstruction technique. COMPARISON:  Brain MRI performed earlier today 01/05/2023. FINDINGS: Brain: Mild generalized cerebral atrophy. Subdural hematoma overlying the left cerebral hemisphere, measuring up to 11 mm in thickness, unchanged in size from the brain MRI performed earlier today. This subdural hematoma has hyperdense acute/subacute components, as well as hypodense components, and this is consistent with an acute on chronic subdural  hematoma. Mass effect upon the underlying left cerebral hemisphere without midline shift or significant ventricular effacement. Mild chronic small vessel ischemic changes within the cerebral white matter, better appreciated on the brain MRI performed earlier today. No demarcated cortical infarct. No evidence of an intracranial mass. No midline shift. Vascular: No hyperdense vessel.  Atherosclerotic calcifications. Skull: No calvarial fracture or aggressive osseous lesion. Sinuses/Orbits: No mass or acute finding within the imaged orbits. No significant paranasal sinus disease at the imaged levels. Impression #1 called by telephone at the time of interpretation on 01/05/2023 at 5:27 pm to provider [facility] , who verbally acknowledged these results. IMPRESSION: 1. 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 Mass effect upon the underlying left cerebral hemisphere without significant midline shift or ventricular effacement. 2. Mild chronic small vessel ischemic changes within the cerebral white matter, better appreciated on the brain MRI performed earlier today. 3. Mild generalized cerebral atrophy. Electronically Signed   By: Jackey Loge D.O.   On: 01/05/2023 17:28   MR BRAIN WO CONTRAST Result Date: 01/05/2023 CLINICAL DATA:  Provided history: Weakness. Additional history provided: Possible TIA. History of melanoma. EXAM: MRI HEAD WITHOUT CONTRAST TECHNIQUE: Multiplanar, multiecho pulse sequences of the brain and surrounding structures were obtained without intravenous contrast. COMPARISON:  None. FINDINGS: Brain: Mild generalized cerebral atrophy. FLAIR hyperintense subdural collection (also containing foci of susceptibility-weighted signal) overlying the left cerebral hemisphere, measuring up to 11 mm in thickness. This likely reflects a subdural hematoma. Mass effect upon the  underlying left cerebral hemisphere without midline shift or significant ventricular effacement. Subtle FLAIR hyperintense signal abnormality within an adjacent mid left frontal lobe sulcus, nonspecific but possibly reflecting subarachnoid hemorrhage (series 116, image 30). Multifocal T2 FLAIR hyperintense signal abnormality within the cerebral white matter, nonspecific but compatible with mild chronic small vessel ischemic disease. There is no acute infarct. No evidence of an intracranial mass. No midline shift. Vascular: Maintained flow voids within the proximal large arterial vessels. Skull and upper cervical spine: No focal worrisome marrow lesion. Sinuses/Orbits: No mass or acute finding within the imaged orbits. Prior bilateral ocular lens replacement. No significant paranasal sinus disease. These results were called by telephone at the time of interpretation on 01/05/2023 at 2:43 pm to provider Hillard Danker, MD, who verbally acknowledged these results. IMPRESSION: 1. Subdural collection overlying the left cerebral hemisphere measuring up to 11 mm in thickness, likely reflecting a subdural hematoma. A non-contrast head CT is recommended to assess the age of this subdural hematoma. Mas effect upon the underlying left cerebral hemisphere without midline shift or significant ventricular effacement. 2. Subtle signal abnormality is present within an adjacent mid left frontal lobe sulcus. Although nonspecific, this could reflect subarachnoid hemorrhage. This too should be further assessed with a non-contrast head CT. 3. Mild chronic small vessel ischemic changes within the cerebral white matter. 4. Mild generalized cerebral atrophy. Electronically Signed   By: Jackey Loge D.O.   On: 01/05/2023 14:49    Procedures Procedures    Medications Ordered in ED Medications - No data to display  ED Course/ Medical Decision Making/ A&P Clinical Course as of 01/05/23 2255  Sat Jan 05, 2023  1723 Discussed with Dr.  Conchita Paris who has reviewed imaging.  Feels that the patient likely does have a subdural hygroma along with a chronic subdural.  Does not feel that any acute intervention is required and that he can follow-up in clinic.  Patient does not need antiepileptics. Can fu in clinic in 1-2 weeks.  [RP]    Clinical Course User Index [RP] Rondel Baton, MD                                 Medical Decision Making Amount and/or Complexity of Data Reviewed Radiology: ordered.   SINJIN LIU is a 77 y.o. male with comorbidities that complicate the patient evaluation including hypertension, hyperlipidemia, and CAD on aspirin who presents to the emergency department with an abnormal MRI.    Initial Ddx:  Subdural hematoma, subarachnoid  hemorrhage, subdural hygroma, stroke, TIA  MDM/Course:  Patient presents emergency department with abnormal MRI findings.  He had an MRI today that showed a subdural hematoma and possible subarachnoid blood and was referred into the emergency department.  Had a CT today that showed acute on chronic subdural hematoma.  Was reviewed by neurosurgery who felt that he likely did have a subdural hematoma that was small but also had a subdural hygroma.  They said that based on his presentation and imaging needs to follow-up in clinic in 1 to 2 weeks for repeat head CT but does not require any acute interventions or sooner interval imaging.  No medication changes warranted at this time and does not need antiepileptics.  Patient updated.  Does not have any neurologic deficits at this time or any new neurologic symptoms that would be concerning for stroke/TIA's will have him continue his workup with his primary doctor.  This patient presents to the ED for concern of complaints listed in HPI, this involves an extensive number of treatment options, and is a complaint that carries with it a high risk of complications and morbidity. Disposition including potential need for admission  considered.   Dispo: DC Home. Return precautions discussed including, but not limited to, those listed in the AVS. Allowed pt time to ask questions which were answered fully prior to dc.  Additional history obtained from spouse Records reviewed Outpatient Clinic Notes I independently reviewed the following imaging with scope of interpretation limited to determining acute life threatening conditions related to emergency care: CT Head and agree with the radiologist interpretation with the following exceptions: none I have reviewed the patients home medications and made adjustments as needed Consults: Neurology Social Determinants of health:  Elderly  Portions of this note were generated with Scientist, clinical (histocompatibility and immunogenetics). Dictation errors may occur despite best attempts at proofreading.     Final Clinical Impression(s) / ED Diagnoses Final diagnoses:  Acute on chronic intracranial subdural hematoma [facility])    Rx / DC Orders ED Discharge Orders     None         Rondel Baton, MD 01/05/23 2255

## 2023-01-05 NOTE — ED Triage Notes (Signed)
Pt arrives via POV. Pt states 11 days ago he had numbness in his right arm and right leg that lasted about 20 mins. Patient's PCP ordered an outpatient MRI. Pt had the MRI today and was told to go straight to the ER for concerns of a Subdural Hematoma. Pt arrives AxOx4, no symptoms at this time.

## 2023-01-07 ENCOUNTER — Ambulatory Visit: Payer: PPO | Admitting: Orthopedic Surgery

## 2023-01-25 ENCOUNTER — Other Ambulatory Visit: Payer: Self-pay | Admitting: Neurosurgery

## 2023-01-25 DIAGNOSIS — I6203 Nontraumatic chronic subdural hemorrhage: Secondary | ICD-10-CM

## 2023-01-28 ENCOUNTER — Telehealth: Payer: Self-pay | Admitting: Diagnostic Neuroimaging

## 2023-01-28 NOTE — Telephone Encounter (Signed)
 Pt cancelled appointment. Pt stated, being seen by a neurosurgery referred through PCP. They are taking care of the problem.

## 2023-01-30 ENCOUNTER — Telehealth: Payer: Self-pay | Admitting: Cardiovascular Disease

## 2023-01-30 NOTE — Telephone Encounter (Signed)
 Pt would ike a c/b from Pharmacist Larinda Plover in regards to Pt. Assistance for Repatha . Please advise

## 2023-01-30 NOTE — Telephone Encounter (Signed)
 HW grant renewed, pt made aware via phone   ID [id number]  BIN [id number]  PCN [id number]   PC Group [id number]

## 2023-02-01 ENCOUNTER — Ambulatory Visit
Admission: RE | Admit: 2023-02-01 | Discharge: 2023-02-01 | Disposition: A | Discharge status: Home / Self Care | Payer: PPO | Source: Ambulatory Visit | Attending: Neurosurgery | Admitting: Neurosurgery

## 2023-02-01 DIAGNOSIS — I6203 Nontraumatic chronic subdural hemorrhage: Secondary | ICD-10-CM | POA: Insufficient documentation

## 2023-02-07 ENCOUNTER — Telehealth (HOSPITAL_COMMUNITY): Payer: Self-pay | Admitting: Cardiovascular Disease

## 2023-02-07 NOTE — Telephone Encounter (Signed)
I called to schedule echocardiogram for March and patient states  that he had an echo and carotid ultrasound at the [facility] office and does not need. He will call the office to ask them to send to Dr Clifton James so he doesnt need to repeat. Order will be removed from the ECHO  WQ.

## 2023-02-11 ENCOUNTER — Ambulatory Visit: Payer: PPO | Admitting: Orthopedic Surgery

## 2023-02-22 ENCOUNTER — Telehealth: Payer: Self-pay | Admitting: Cardiovascular Disease

## 2023-02-22 NOTE — Telephone Encounter (Signed)
 Called and spoke w the patient.  He has the report of echo.  He will upload or fax it to our office.  I sent him instructions/fax number in his mychart portal and also scheduled his one year follow up.

## 2023-02-22 NOTE — Telephone Encounter (Signed)
  The patient stated that he had an echo done on 01/02/23 at [facility], with results read by Dr. Toribio Bibber. He has a copy of the results and would like to know if this is sufficient for Dr. Verlin or if he still needs to get an echo before his appointment in April. Additionally, the patient would like to schedule an appointment but prefers to see Dr. Verlin only. He is requesting a callback from his nurse.

## 2023-02-26 ENCOUNTER — Telehealth: Payer: Self-pay | Admitting: Cardiovascular Disease

## 2023-02-26 NOTE — Telephone Encounter (Signed)
Called the patient and let him know that the echo report has been reviewed by md and was sent to be scanned into the chart.  Nothing further needed before his appointment in April.

## 2023-02-26 NOTE — Telephone Encounter (Signed)
Patient is requesting call back to confirm fax was received.

## 2023-03-01 ENCOUNTER — Ambulatory Visit: Payer: PPO | Admitting: Diagnostic Neuroimaging

## 2023-03-08 ENCOUNTER — Telehealth: Payer: Self-pay | Admitting: Pharmacy Technician

## 2023-03-08 ENCOUNTER — Other Ambulatory Visit (HOSPITAL_COMMUNITY): Payer: Self-pay

## 2023-03-08 ENCOUNTER — Telehealth: Payer: Self-pay | Admitting: Cardiovascular Disease

## 2023-03-08 NOTE — Telephone Encounter (Deleted)
PA request has been Cancelled.  Will resubmit with labs once received

## 2023-03-08 NOTE — Telephone Encounter (Signed)
Pt c/o medication issue:  1. Name of Medication:   Evolocumab (REPATHA SURECLICK) 140 MG/ML SOAJ   2. How are you currently taking this medication (dosage and times per day)?  As prescribed  3. Are you having a reaction (difficulty breathing--STAT)?   No  4. What is your medication issue?    Patient stated he will need prior approval to get his medication renewed.  Patient stated he only has 1 more dose of this medication.

## 2023-03-08 NOTE — Telephone Encounter (Signed)
Pharmacy Patient Advocate Encounter   Received notification from Pt Calls Messages that prior authorization for Repatha is required/requested.   Insurance verification completed.   The patient is insured through [facility] ADVANTAGE/RX ADVANCE .   Per test claim: PA required; PA submitted to above mentioned insurance via CoverMyMeds Key/confirmation #/EOC [id number] Status is pending

## 2023-04-03 DIAGNOSIS — N401 Enlarged prostate with lower urinary tract symptoms: Secondary | ICD-10-CM | POA: Diagnosis not present

## 2023-04-03 DIAGNOSIS — R351 Nocturia: Secondary | ICD-10-CM | POA: Diagnosis not present

## 2023-04-03 DIAGNOSIS — R3914 Feeling of incomplete bladder emptying: Secondary | ICD-10-CM | POA: Diagnosis not present

## 2023-04-16 DIAGNOSIS — R600 Localized edema: Secondary | ICD-10-CM | POA: Diagnosis not present

## 2023-04-16 DIAGNOSIS — R258 Other abnormal involuntary movements: Secondary | ICD-10-CM | POA: Diagnosis not present

## 2023-04-16 DIAGNOSIS — R059 Cough, unspecified: Secondary | ICD-10-CM | POA: Diagnosis not present

## 2023-04-16 DIAGNOSIS — I350 Nonrheumatic aortic (valve) stenosis: Secondary | ICD-10-CM | POA: Diagnosis not present

## 2023-04-22 ENCOUNTER — Ambulatory Visit: Payer: PPO | Attending: Cardiovascular Disease | Admitting: Cardiovascular Disease

## 2023-04-22 ENCOUNTER — Encounter: Payer: Self-pay | Admitting: Cardiovascular Disease

## 2023-04-22 VITALS — BP 160/74 | HR 77 | Ht 70.0 in | Wt 242.4 lb

## 2023-04-22 DIAGNOSIS — I2510 Atherosclerotic heart disease of native coronary artery without angina pectoris: Secondary | ICD-10-CM

## 2023-04-22 DIAGNOSIS — I350 Nonrheumatic aortic (valve) stenosis: Secondary | ICD-10-CM | POA: Diagnosis not present

## 2023-04-22 DIAGNOSIS — E785 Hyperlipidemia, unspecified: Secondary | ICD-10-CM

## 2023-04-22 DIAGNOSIS — I5033 Acute on chronic diastolic (congestive) heart failure: Secondary | ICD-10-CM

## 2023-04-22 DIAGNOSIS — I10 Essential (primary) hypertension: Secondary | ICD-10-CM | POA: Diagnosis not present

## 2023-04-22 MED ORDER — FUROSEMIDE 40 MG PO TABS: 40.0000 mg | ORAL_TABLET | Freq: Every day | ORAL | 3 refills | Status: AC

## 2023-04-22 NOTE — Progress Notes (Signed)
 Chief Complaint  Patient presents with   Follow-up    CAD, aortic stenosis   History of Present Illness: 78 yo male with history of CAD, aortic stenosis, chronic diastolic CHF, HTN, HLD, subdural hematoma and GERD here today for cardiac follow up. He had been followed by Dr. Katrinka Blazing. He has CAD and had three drug eluting stents placed in the LAD in 2015. He has been followed for moderate aortic stenosis. Echo March 2024 with LVEF=60-65%. Moderate aortic stenosis with mean gradient 20 mmHg, AVA 2.09 cm2, SVI 66, DI 0.46. Echo arranged by primary care and read by outside cardiology group in December 2024 with normal LV function and moderate aortic stenosis with mean gradient 20 mmHg. He had a small subdural IC bleed in December 2024. His ASA was stopped. His bleed resolved on follow up imaging.   He is here today for follow up. The patient denies any chest pain, dyspnea, palpitations, lower extremity edema, orthopnea, PND, dizziness, near syncope or syncope.   Primary Care Physician: Renford Dills, MD   Past Medical History:  Diagnosis Date   Arthritis    right knee   BPH (benign prostatic hypertrophy)    TURP done   Cancer [facility])    skin   Colon adenoma    Complication of anesthesia    Respiratory issues with Succinylcholine   Coronary artery disease    3 stent   Difficult intubation    Ejection fraction    Erectile dysfunction    Fecal incontinence    GERD (gastroesophageal reflux disease)    Heart murmur 2008   LVH, diastolic dysfunction, aortic sclerosis   Hemorrhoids    History of kidney stones    Hyperlipidemia    Hypertension    Insomnia    Melanoma (HCC)    Scalp    Past Surgical History:  Procedure Laterality Date   CATARACT EXTRACTION W/PHACO Right 09/24/2018   Procedure: CATARACT EXTRACTION PHACO AND INTRAOCULAR LENS PLACEMENT (IOC)COMPLICATED RIGHT;  Surgeon: Lockie Mola, MD;  Location: [facility];  Service: Ophthalmology;  Laterality: Right;   MAYLUGIN   CATARACT EXTRACTION W/PHACO Left 10/15/2018   Procedure: CATARACT EXTRACTION PHACO AND INTRAOCULAR LENS PLACEMENT (IOC) RIGHT  01:16.3  17.9%  13.79;  Surgeon: Lockie Mola, MD;  Location: [facility];  Service: Ophthalmology;  Laterality: Left;   CYSTOSCOPY WITH URETHRAL DILATATION N/A 07/24/2022   Procedure: CYSTOSCOPY WITH URETHRAL DILATATION;  Surgeon: Noel Christmas, MD;  Location: [facility];  Service: Urology;  Laterality: N/A;   LEFT HEART CATHETERIZATION WITH CORONARY ANGIOGRAM N/A 12/31/2013   Procedure: LEFT HEART CATHETERIZATION WITH CORONARY ANGIOGRAM;  Surgeon: Lesleigh Noe, MD;  Location: [facility] C[facility];  Service: Cardiovascular;  Laterality: N/A; Ostial and mid LAD stenting   PROSTATE BIOPSY  ~ 1991   SKIN LESION EXCISION Right 01/16/2011   precancerous lesion from arm   TONSILLECTOMY     as a child   TOTAL KNEE ARTHROPLASTY Right 07/21/2021   Procedure: RIGHT TOTAL KNEE ARTHROPLASTY;  Surgeon: Kathryne Hitch, MD;  Location: [facility];  Service: Orthopedics;  Laterality: Right;   TOTAL KNEE ARTHROPLASTY Left 12/01/2021   Procedure: LEFT TOTAL KNEE ARTHROPLASTY;  Surgeon: Kathryne Hitch, MD;  Location: [facility];  Service: Orthopedics;  Laterality: Left;   TRANSURETHRAL RESECTION OF BLADDER TUMOR N/A 07/24/2022   Procedure: TRANSURETHRAL RESECTION OF PROSTATE;  Surgeon: Noel Christmas, MD;  Location: [facility];  Service: Urology;  Laterality: N/A;   TRANSURETHRAL RESECTION  OF PROSTATE N/A 07/09/2019   Procedure: TRANSURETHRAL RESECTION OF THE PROSTATE (TURP);  Surgeon: Marcine Matar, MD;  Location: [facility];  Service: Urology;  Laterality: N/A;    Current Outpatient Medications  Medication Sig Dispense Refill   acetaminophen (TYLENOL) 500 MG tablet Take 1,000 mg by mouth 2 (two) times daily.     amLODipine (NORVASC) 10 MG tablet Take 10 mg by mouth daily.     aspirin EC 81 MG tablet Take 81 mg by mouth daily. Swallow whole.      Evolocumab (REPATHA SURECLICK) 140 MG/ML SOAJ Inject 140 mg into the skin every 14 (fourteen) days. 6 mL 3   famotidine (PEPCID) 20 MG tablet Take 20 mg by mouth every evening.     furosemide (LASIX) 40 MG tablet Take 1 tablet (40 mg total) by mouth daily. 90 tablet 3   hydrocortisone cream 1 % Apply 1 Application topically daily as needed for itching.     loratadine (CLARITIN) 10 MG tablet TAKE 1 TABLET BY MOUTH EVERY DAY (Patient taking differently: Take 10 mg by mouth daily as needed for allergies.) 90 tablet 3   omeprazole (PRILOSEC) 40 MG capsule Take 40 mg by mouth in the morning and at bedtime.     OVER THE COUNTER MEDICATION Take 2 capsules by mouth at bedtime. Relaxium sleep     potassium chloride SA (KLOR-CON M) 20 MEQ tablet Take 1 tablet (20 mEq total) by mouth daily. 6 tablet 0   tadalafil (CIALIS) 5 MG tablet Take 5 mg by mouth daily.     telmisartan (MICARDIS) 80 MG tablet Take 80 mg by mouth daily.     OVER THE COUNTER MEDICATION Take 3 capsules by mouth daily. Balance of nature veggies     OVER THE COUNTER MEDICATION Take 3 capsules by mouth daily. Balance of nature fruits     Current Facility-Administered Medications  Medication Dose Route Frequency Provider Last Rate Last Admin   hyoscyamine (ANASPAZ) disintergrating tablet 0.125 mg  0.125 mg Sublingual Q6H PRN Wyline Mood, NP        Allergies  Allergen Reactions   Latex     redness   Statins     myalgia   Succinylcholine Chloride Other (See Comments)    Unclear reaction approx 25 years ago at [facility] [place]. Was told to wear medical alert bracelet. Patient has never heard terms "pseudocholinesterase deficiency" or "malignant hyperthermia". Multiple uneventful anesthetics with halogenated agents. Dereck Ligas, MD (anesthesiology)   Sulfa Antibiotics Itching and Rash    Social History   Socioeconomic History   Marital status: Married    Spouse name: Not on file   Number of children: Not on file   Years of  education: Not on file   Highest education level: Not on file  Occupational History   Not on file  Tobacco Use   Smoking status: Never   Smokeless tobacco: Never  Vaping Use   Vaping status: Never Used  Substance and Sexual Activity   Alcohol use: Yes    Alcohol/week: 28.0 standard drinks of alcohol    Types: 28 Glasses of wine per week   Drug use: No   Sexual activity: Yes  Other Topics Concern   Not on file  Social History Narrative   Not on file   Social Drivers of Health   Financial Resource Strain: Not on file  Food Insecurity: No Food Insecurity (12/01/2021)   Hunger Vital Sign    Worried About Running Out of  Food in the Last Year: Never true    Ran Out of Food in the Last Year: Never true  Transportation Needs: No Transportation Needs (12/01/2021)   PRAPARE - Administrator, Civil Service (Medical): No    Lack of Transportation (Non-Medical): No  Physical Activity: Not on file  Stress: Not on file  Social Connections: Not on file  Intimate Partner Violence: Not At Risk (12/01/2021)   Humiliation, Afraid, Rape, and Kick questionnaire    Fear of Current or Ex-Partner: No    Emotionally Abused: No    Physically Abused: No    Sexually Abused: No    Family History  Problem Relation Age of Onset   Heart disease Mother    Heart attack Father    Diabetes Sister    Cancer - Colon Maternal Uncle     Review of Systems:  As stated in the HPI and otherwise negative.   BP (!) 160/74   Pulse 77   Ht 5\' 10"  (1.778 m)   Wt 110 kg   SpO2 96%   BMI 34.78 kg/m   Physical Examination: General: Well developed, well nourished, NAD  HEENT: OP clear, mucus membranes moist  SKIN: warm, dry. No rashes. Neuro: No focal deficits  Musculoskeletal: Muscle strength 5/5 all ext  Psychiatric: Mood and affect normal  Neck: No JVD, no carotid bruits, no thyromegaly, no lymphadenopathy.  Lungs:Clear bilaterally, no wheezes, rhonci, crackles Cardiovascular: Regular  rate and rhythm. No murmurs, gallops or rubs. Abdomen:Soft. Bowel sounds present. Non-tender.  Extremities: No lower extremity edema. Pulses are 2 + in the bilateral DP/PT.  EKG:  EKG is not ordered today. The ekg ordered today demonstrates   Echo March 2024: 1. Left ventricular ejection fraction, by estimation, is 60 to 65%. The  left ventricle has normal function. The left ventricle has no regional  wall motion abnormalities. There is mild left ventricular hypertrophy of  the septal segment. Left ventricular  diastolic parameters are consistent with Grade I diastolic dysfunction  (impaired relaxation). Elevated left ventricular end-diastolic pressure.   2. Right ventricular systolic function is normal. The right ventricular  size is normal. There is normal pulmonary artery systolic pressure.   3. Left atrial size was severely dilated.   4. The mitral valve is normal in structure. No evidence of mitral valve  regurgitation. No evidence of mitral stenosis.   5. Mean aortic valve gradient 08/2021 was 27 mmHg. The aortic valve is  tricuspid. There is mild calcification of the aortic valve. There is mild  thickening of the aortic valve. Aortic valve regurgitation is not  visualized. Moderate aortic valve stenosis.   Aortic valve area, by VTI measures 2.09 cm. Aortic valve mean gradient  measures 20.0 mmHg. Aortic valve Vmax measures 3.02 m/s.   6. The inferior vena cava is normal in size with greater than 50%  respiratory variability, suggesting right atrial pressure of 3 mmHg.   Recent Labs: 07/24/2022: Hemoglobin 15.7; Platelets 314 12/05/2022: BUN 9; Creatinine, Ser 0.50; Magnesium 1.8; Potassium 2.9; Sodium 130   Lipid Panel    Component Value Date/Time   CHOL 149 05/21/2017 0930   TRIG 89 05/21/2017 0930   HDL 59 05/21/2017 0930   CHOLHDL 2.5 05/21/2017 0930   LDLCALC 72 05/21/2017 0930     Wt Readings from Last 3 Encounters:  04/22/23 110 kg  01/05/23 104.3 kg  07/24/22  104.3 kg    Assessment and Plan:   1. CAD without angina: No chest  pain. Will not restart ASA given the intracranial bleeding. Continue Repatha. He is intolerant of statins.    2. Aortic stenosis: Moderate by outside echo in December 2024. Repeat echo in December 2025.   3. Acute on Chronic diastolic CHF: He has LE edema. Wt is up 6 Kg over last 3 months. Will stop Chlorthalidone. Will start Lasix 40 mg daily. BMET one week.   4. HTN: BP is controlled at home. Will stop chlorthalidone as above. Continue Norvasc and Micardis.   5. Hyperlipidemia: LDL 62 in May 2024. Followed in primary care. Continue Repatha. He is intolerant of statins.   Labs/ tests ordered today include:   Orders Placed This Encounter  Procedures   Basic Metabolic Panel (BMET)   ECHOCARDIOGRAM COMPLETE   Disposition:   F/U with me in 9 months  Signed, Verne Carrow, MD, [facility] 04/22/2023 11:34 AM    [facility] [address] [place], [place], [place]  [address] Phone: [phone]; Fax: [phone]

## 2023-04-22 NOTE — Patient Instructions (Signed)
 Medication Instructions:  Your physician has recommended you make the following change in your medication:  1.) stop chlorthalidone 2.) start furosemide (Lasix) 40 mg - one tablet daily  *If you need a refill on your cardiac medications before your next appointment, please call your pharmacy*  Lab Work: [facility] in about 7-10 days for blood work (bmet)  Testing/Procedures: due in Dec 2025 [facility]) Your physician has requested that you have an echocardiogram. Echocardiography is a painless test that uses sound waves to create images of your heart. It provides your doctor with information about the size and shape of your heart and how well your heart's chambers and valves are working. This procedure takes approximately one hour. There are no restrictions for this procedure. Please do NOT wear cologne, perfume, aftershave, or lotions (deodorant is allowed). Please arrive 15 minutes prior to your appointment time.  Please note: We ask at that you not bring children with you during ultrasound (echo/ vascular) testing. Due to room size and safety concerns, children are not allowed in the ultrasound rooms during exams. Our front office staff cannot provide observation of children in our lobby area while testing is being conducted. An adult accompanying a patient to their appointment will only be allowed in the ultrasound room at the discretion of the ultrasound technician under special circumstances. We apologize for any inconvenience.   Follow-Up: At [facility], you and your health needs are our priority.  As part of our continuing mission to provide you with exceptional heart care, our providers are all part of one team.  This team includes your primary Cardiologist (physician) and Advanced Practice Providers or APPs (Physician Assistants and Nurse Practitioners) who all work together to provide you with the care you need, when you need it.  Your next appointment:   9 month(s) (Jan  2026)  Provider:   Verne Carrow, MD        1st Floor: - Lobby - Registration  - Pharmacy  - Lab - Cafe  2nd Floor: - PV Lab - Diagnostic Testing (echo, CT, nuclear med)  3rd Floor: - Vacant  4th Floor: - TCTS (cardiothoracic surgery) - AFib Clinic - Structural Heart Clinic - Vascular Surgery  - Vascular Ultrasound  5th Floor: - HeartCare Cardiology (general and EP) - Clinical Pharmacy for coumadin, hypertension, lipid, weight-loss medications, and med management appointments    Valet parking services will be available as well.

## 2023-04-24 DIAGNOSIS — C44729 Squamous cell carcinoma of skin of left lower limb, including hip: Secondary | ICD-10-CM | POA: Diagnosis not present

## 2023-04-24 DIAGNOSIS — D034 Melanoma in situ of scalp and neck: Secondary | ICD-10-CM | POA: Diagnosis not present

## 2023-04-24 DIAGNOSIS — D225 Melanocytic nevi of trunk: Secondary | ICD-10-CM | POA: Diagnosis not present

## 2023-04-24 DIAGNOSIS — D2272 Melanocytic nevi of left lower limb, including hip: Secondary | ICD-10-CM | POA: Diagnosis not present

## 2023-04-24 DIAGNOSIS — Z85820 Personal history of malignant melanoma of skin: Secondary | ICD-10-CM | POA: Diagnosis not present

## 2023-04-24 DIAGNOSIS — D2262 Melanocytic nevi of left upper limb, including shoulder: Secondary | ICD-10-CM | POA: Diagnosis not present

## 2023-04-24 DIAGNOSIS — D235 Other benign neoplasm of skin of trunk: Secondary | ICD-10-CM | POA: Diagnosis not present

## 2023-04-24 DIAGNOSIS — L570 Actinic keratosis: Secondary | ICD-10-CM | POA: Diagnosis not present

## 2023-04-24 DIAGNOSIS — D2261 Melanocytic nevi of right upper limb, including shoulder: Secondary | ICD-10-CM | POA: Diagnosis not present

## 2023-04-24 DIAGNOSIS — C44719 Basal cell carcinoma of skin of left lower limb, including hip: Secondary | ICD-10-CM | POA: Diagnosis not present

## 2023-04-24 DIAGNOSIS — D485 Neoplasm of uncertain behavior of skin: Secondary | ICD-10-CM | POA: Diagnosis not present

## 2023-04-30 DIAGNOSIS — I5033 Acute on chronic diastolic (congestive) heart failure: Secondary | ICD-10-CM | POA: Diagnosis not present

## 2023-04-30 LAB — BASIC METABOLIC PANEL WITH GFR
BUN/Creatinine Ratio: 13 (ref 10–24)
BUN: 10 mg/dL (ref 8–27)
CO2: 25 mmol/L (ref 20–29)
Calcium: 8.9 mg/dL (ref 8.6–10.2)
Chloride: 98 mmol/L (ref 96–106)
Creatinine, Ser: 0.75 mg/dL — ABNORMAL LOW (ref 0.76–1.27)
Glucose: 134 mg/dL — ABNORMAL HIGH (ref 70–99)
Potassium: 3.7 mmol/L (ref 3.5–5.2)
Sodium: 139 mmol/L (ref 134–144)
eGFR: 92 mL/min/{1.73_m2} (ref >59)

## 2023-05-16 ENCOUNTER — Other Ambulatory Visit

## 2023-05-21 ENCOUNTER — Other Ambulatory Visit: Payer: Self-pay | Admitting: Urology

## 2023-07-02 DIAGNOSIS — I350 Nonrheumatic aortic (valve) stenosis: Secondary | ICD-10-CM | POA: Diagnosis not present

## 2023-07-02 DIAGNOSIS — Z1331 Encounter for screening for depression: Secondary | ICD-10-CM | POA: Diagnosis not present

## 2023-07-02 DIAGNOSIS — G720 Drug-induced myopathy: Secondary | ICD-10-CM | POA: Diagnosis not present

## 2023-07-02 DIAGNOSIS — I700 Atherosclerosis of aorta: Secondary | ICD-10-CM | POA: Diagnosis not present

## 2023-07-02 DIAGNOSIS — Z23 Encounter for immunization: Secondary | ICD-10-CM | POA: Diagnosis not present

## 2023-07-02 DIAGNOSIS — Z0000 Encounter for general adult medical examination without abnormal findings: Secondary | ICD-10-CM | POA: Diagnosis not present

## 2023-07-02 DIAGNOSIS — I10 Essential (primary) hypertension: Secondary | ICD-10-CM | POA: Diagnosis not present

## 2023-07-02 DIAGNOSIS — Z85820 Personal history of malignant melanoma of skin: Secondary | ICD-10-CM | POA: Diagnosis not present

## 2023-07-02 DIAGNOSIS — I2510 Atherosclerotic heart disease of native coronary artery without angina pectoris: Secondary | ICD-10-CM | POA: Diagnosis not present

## 2023-07-02 DIAGNOSIS — E7800 Pure hypercholesterolemia, unspecified: Secondary | ICD-10-CM | POA: Diagnosis not present

## 2023-07-03 DIAGNOSIS — D034 Melanoma in situ of scalp and neck: Secondary | ICD-10-CM | POA: Diagnosis not present

## 2023-07-03 DIAGNOSIS — D1801 Hemangioma of skin and subcutaneous tissue: Secondary | ICD-10-CM | POA: Diagnosis not present

## 2023-07-03 DIAGNOSIS — D224 Melanocytic nevi of scalp and neck: Secondary | ICD-10-CM | POA: Diagnosis not present

## 2023-07-08 DIAGNOSIS — Z961 Presence of intraocular lens: Secondary | ICD-10-CM | POA: Diagnosis not present

## 2023-07-08 DIAGNOSIS — H43813 Vitreous degeneration, bilateral: Secondary | ICD-10-CM | POA: Diagnosis not present

## 2023-07-08 DIAGNOSIS — H26491 Other secondary cataract, right eye: Secondary | ICD-10-CM | POA: Diagnosis not present

## 2023-07-08 DIAGNOSIS — H33312 Horseshoe tear of retina without detachment, left eye: Secondary | ICD-10-CM | POA: Diagnosis not present

## 2023-07-17 DIAGNOSIS — D225 Melanocytic nevi of trunk: Secondary | ICD-10-CM | POA: Diagnosis not present

## 2023-07-17 DIAGNOSIS — D235 Other benign neoplasm of skin of trunk: Secondary | ICD-10-CM | POA: Diagnosis not present

## 2023-07-23 DIAGNOSIS — H9313 Tinnitus, bilateral: Secondary | ICD-10-CM | POA: Diagnosis not present

## 2023-07-31 DIAGNOSIS — C44719 Basal cell carcinoma of skin of left lower limb, including hip: Secondary | ICD-10-CM | POA: Diagnosis not present

## 2023-10-31 DIAGNOSIS — Z23 Encounter for immunization: Secondary | ICD-10-CM | POA: Diagnosis not present

## 2023-11-13 DIAGNOSIS — Z86006 Personal history of melanoma in-situ: Secondary | ICD-10-CM | POA: Diagnosis not present

## 2023-11-13 DIAGNOSIS — L570 Actinic keratosis: Secondary | ICD-10-CM | POA: Diagnosis not present

## 2023-11-13 DIAGNOSIS — Z85820 Personal history of malignant melanoma of skin: Secondary | ICD-10-CM | POA: Diagnosis not present

## 2023-11-13 DIAGNOSIS — Z85828 Personal history of other malignant neoplasm of skin: Secondary | ICD-10-CM | POA: Diagnosis not present

## 2023-11-13 DIAGNOSIS — D2272 Melanocytic nevi of left lower limb, including hip: Secondary | ICD-10-CM | POA: Diagnosis not present

## 2023-11-13 DIAGNOSIS — D2261 Melanocytic nevi of right upper limb, including shoulder: Secondary | ICD-10-CM | POA: Diagnosis not present

## 2023-11-13 DIAGNOSIS — D225 Melanocytic nevi of trunk: Secondary | ICD-10-CM | POA: Diagnosis not present

## 2023-11-13 DIAGNOSIS — D2262 Melanocytic nevi of left upper limb, including shoulder: Secondary | ICD-10-CM | POA: Diagnosis not present

## 2023-11-18 ENCOUNTER — Encounter: Payer: Self-pay | Admitting: Radiology

## 2023-12-16 DIAGNOSIS — Z85820 Personal history of malignant melanoma of skin: Secondary | ICD-10-CM | POA: Diagnosis not present

## 2023-12-16 DIAGNOSIS — I700 Atherosclerosis of aorta: Secondary | ICD-10-CM | POA: Diagnosis not present

## 2023-12-16 DIAGNOSIS — G720 Drug-induced myopathy: Secondary | ICD-10-CM | POA: Diagnosis not present

## 2023-12-16 DIAGNOSIS — E7800 Pure hypercholesterolemia, unspecified: Secondary | ICD-10-CM | POA: Diagnosis not present

## 2023-12-16 DIAGNOSIS — I10 Essential (primary) hypertension: Secondary | ICD-10-CM | POA: Diagnosis not present

## 2023-12-16 DIAGNOSIS — I350 Nonrheumatic aortic (valve) stenosis: Secondary | ICD-10-CM | POA: Diagnosis not present

## 2023-12-16 DIAGNOSIS — I2510 Atherosclerotic heart disease of native coronary artery without angina pectoris: Secondary | ICD-10-CM | POA: Diagnosis not present

## 2024-01-01 ENCOUNTER — Other Ambulatory Visit

## 2024-01-01 DIAGNOSIS — I350 Nonrheumatic aortic (valve) stenosis: Secondary | ICD-10-CM

## 2024-01-01 LAB — ECHOCARDIOGRAM COMPLETE
AR max vel: 1.19 cm2
AV Area VTI: 1.12 cm2
AV Area mean vel: 1.26 cm2
AV Mean grad: 28 mmHg
AV Peak grad: 54.8 mmHg
Ao pk vel: 3.7 m/s
Area-P 1/2: 3.31 cm2
S' Lateral: 3.21 cm

## 2024-01-02 ENCOUNTER — Ambulatory Visit: Payer: Self-pay | Admitting: Cardiovascular Disease

## 2024-01-02 DIAGNOSIS — I5033 Acute on chronic diastolic (congestive) heart failure: Secondary | ICD-10-CM

## 2024-01-02 DIAGNOSIS — I350 Nonrheumatic aortic (valve) stenosis: Secondary | ICD-10-CM

## 2024-01-02 DIAGNOSIS — I2510 Atherosclerotic heart disease of native coronary artery without angina pectoris: Secondary | ICD-10-CM

## 2024-01-03 ENCOUNTER — Other Ambulatory Visit

## 2024-01-07 ENCOUNTER — Telehealth: Payer: Self-pay | Admitting: Pharmacist

## 2024-01-07 NOTE — Telephone Encounter (Signed)
 Pt c/o medication issue:  1. Name of Medication: Evolocumab  (REPATHA  SURECLICK) 140 MG/ML SOAJ   2. How are you currently taking this medication (dosage and times per day)? As written  3. Are you having a reaction (difficulty breathing--STAT)? No  4. What is your medication issue? Pt is calling to get authorization for upcoming year

## 2024-01-08 ENCOUNTER — Other Ambulatory Visit (HOSPITAL_COMMUNITY): Payer: Self-pay

## 2024-01-08 ENCOUNTER — Telehealth: Payer: Self-pay | Admitting: Pharmacy Technician

## 2024-01-08 NOTE — Telephone Encounter (Signed)
 Pharmacy Patient Advocate Encounter   Received notification from Physician's Office that prior authorization for Repatha  is required/requested.   Insurance verification completed.   The patient is insured through [facility] [employer].   Per test claim: PA required; PA submitted to above mentioned insurance via Latent Key/confirmation #/EOC [id number] Status is pending

## 2024-01-08 NOTE — Telephone Encounter (Signed)
 Seems too soon to submit PA-    Try for January -Refill too soon - PA on file

## 2024-01-17 ENCOUNTER — Other Ambulatory Visit (HOSPITAL_COMMUNITY): Payer: Self-pay

## 2024-01-24 ENCOUNTER — Other Ambulatory Visit: Payer: Self-pay | Admitting: Cardiovascular Disease

## 2024-01-24 DIAGNOSIS — E785 Hyperlipidemia, unspecified: Secondary | ICD-10-CM

## 2024-01-24 DIAGNOSIS — I2510 Atherosclerotic heart disease of native coronary artery without angina pectoris: Secondary | ICD-10-CM

## 2024-01-24 MED ORDER — REPATHA SURECLICK 140 MG/ML ~~LOC~~ SOAJ: 140.0000 mg | SUBCUTANEOUS | 3 refills | Status: AC

## 2024-01-30 NOTE — Telephone Encounter (Signed)
Pt calling in for update. Please advise

## 2024-02-07 ENCOUNTER — Telehealth: Payer: Self-pay | Admitting: Pharmacy Technician

## 2024-02-07 ENCOUNTER — Other Ambulatory Visit (HOSPITAL_COMMUNITY): Payer: Self-pay

## 2024-02-07 NOTE — Telephone Encounter (Signed)
 Ran test claim for repatha . For a 28  day supply and the co-pay is too soon until 03/31/24 . PA is not needed at this time. This test claim was processed through [facility]- copay amounts may vary at other pharmacies due to pharmacy/plan contracts, or as the patient moves through the different stages of their insurance plan.

## 2024-02-07 NOTE — Telephone Encounter (Signed)
 Pt would like a c/b regarding recent update, please advise.

## 2024-02-10 ENCOUNTER — Telehealth: Payer: Self-pay | Admitting: Pharmacy Technician

## 2024-02-10 NOTE — Telephone Encounter (Signed)
 Patient Advocate Encounter   The patient was approved for a [employer] grant that will help cover the cost of repatha  Total amount awarded, 2500.  Effective: 02/03/24 - 02/01/25   APW:[id number] ERW:[id number] Hmnle:[id number] PI:[id number] [employer] ID: [id number]   Pharmacy provided with approval and processing information. Patient informed via rph

## 2024-02-10 NOTE — Telephone Encounter (Addendum)
 Spoke with patient and he had questions about Repatha  grant renewal. [place] renewal completed by team, and pharmacy information sent to [facility] on [address], [place], [place] and sent in [place] message to patient. Also changed preferred pharmacy as requested by patient to [facility] above.

## 2024-04-09 ENCOUNTER — Ambulatory Visit: Admitting: Cardiovascular Disease
